# Patient Record
Sex: Male | Born: 1935 | Race: White | Hispanic: No | Marital: Married | Smoking: Former smoker
Health system: Southern US, Community
[De-identification: ages and names within clinical notes are randomized; demographics above are authoritative.]

## PROBLEM LIST (undated history)

## (undated) DIAGNOSIS — E119 Type 2 diabetes mellitus without complications: Secondary | ICD-10-CM

## (undated) DIAGNOSIS — H409 Unspecified glaucoma: Secondary | ICD-10-CM

## (undated) DIAGNOSIS — E785 Hyperlipidemia, unspecified: Secondary | ICD-10-CM

## (undated) DIAGNOSIS — I10 Essential (primary) hypertension: Secondary | ICD-10-CM

## (undated) DIAGNOSIS — N189 Chronic kidney disease, unspecified: Secondary | ICD-10-CM

## (undated) HISTORY — PX: CHOLECYSTECTOMY: SHX55

---

## 1999-04-09 ENCOUNTER — Emergency Department (HOSPITAL_COMMUNITY): Admission: EM | Admit: 1999-04-09 | Discharge: 1999-04-09 | Payer: Self-pay | Admitting: Emergency Medicine

## 2007-12-17 ENCOUNTER — Ambulatory Visit: Payer: Self-pay | Admitting: Internal Medicine

## 2007-12-17 ENCOUNTER — Inpatient Hospital Stay (HOSPITAL_COMMUNITY): Admission: EM | Admit: 2007-12-17 | Discharge: 2007-12-25 | Payer: Self-pay | Admitting: Emergency Medicine

## 2007-12-18 ENCOUNTER — Encounter: Payer: Self-pay | Admitting: Gastroenterology

## 2007-12-20 ENCOUNTER — Encounter (INDEPENDENT_AMBULATORY_CARE_PROVIDER_SITE_OTHER): Payer: Self-pay | Admitting: General Surgery

## 2007-12-21 ENCOUNTER — Encounter: Payer: Self-pay | Admitting: Gastroenterology

## 2010-05-26 NOTE — Miscellaneous (Signed)
Summary: ERCP  Clinical Lists Changes  Observations: Added new observation of ERCP: Location: [facility].  (12/21/2007 15:50)      ERCP  Procedure date:  12/21/2007  Findings:      Location: [facility].  Patient Name: Douglas Melton, Douglas Melton MRN: [id number] Procedure Procedures: Therapeutic Endoscopic Retrograde Cholangiopancreatography CPT: 43260.    with SphincterotomyCPT: 43262.    With Stone Removal CPT: S6289224.  Personnel: Endoscopist: Venita Lick. Russella Dar, MD, Clementeen Graham.  Exam Location: Exam performed in Endoscopy Suite. Inpatient-ward  Patient Consent: Procedure, Alternatives, Risks and Benefits discussed, consent obtained, from patient. Consent was obtained by the RN.  Indications  Abnormal Exams, Studies: Cholangiogram, abnormal, do not suspect malignancy. Chemistry, abnormal, do not suspect malignancy.  Symptoms: Jaundice.  Comments: CBD stone on IOC History  Current Medications: Patient is not currently taking Coumadin.  Pre-Exam Physical: Performed Dec 18, 2007. Cardio-pulmonary exam, Skin color, HEENT exam  WNL. Abdominal exam abnormal. Mental status exam WNL.  Comments: Pt. history reviewed/updated, physical exam performed prior to initiation of sedation?Yes Exam Exam: Images taken. ASA Classification: II. Tolerance: excellent.  Monitoring: Pulse and BP monitoring done. Oximetry used. Supplemental O2 given. at 2 Liters.  Sedation Meds: Patient assessed and found to be appropriate for moderate (conscious) sedation. Fentanyl 90.0 given IV. Versed 5 mg. given IV. Cetacaine Spray 2 sprays given aerosolized. Glucagon 0.5 mg. given IV.  Fluoroscopy: Fluoroscopy was used.  Findings Normal : Right Hepatic Duct.  Normal : Left Hepatic Duct.  Normal : Common Hepatic Duct.  - PRIOR SURGERY: Cholecystectomy.  STONE(S) : Maximum size: 5 mm. Common Bile Duct,  mobile, ICD9: Choledocholithiasis: 574.50.  - Stone Removal: Left Hepatic Duct. Patient  tolerance excellent. Outcome: successful. There were no complications. Comments: Balloon stone extraction of 2 CBD stones; excellent drainage following extraction.  Normal : Major Duodenal Papilla. Comments: prior sphincterotomy noted.  - Sphincterotomy: Left Hepatic Duct. Patient tolerance excellent. Outcome: successful. There were no complications. Comments: sphincterotomy extended.   Assessment  Diagnoses: 574.50: Choledocholithiasis.   Events  Unplanned Intervention: No intervention was required.  Unplanned Events: There were no complications. Plans  Post Exam Instructions: Post sedation instructions given. No aspirin or non-steroidal containing medications: 2 weeks.  Patient Education: Patient given standard instructions for: Stones.  Disposition: After procedure patient sent to recovery. After recovery patient sent back to hospital.  Scheduling: Office Visit, to [employer]. Russella Dar, MD, [facility], prn    This report was created from the original endoscopy report, which was reviewed and signed by the above listed endoscopist.

## 2010-05-26 NOTE — Procedures (Signed)
Summary: ERCP   ERCP  Procedure date:  12/18/2007  Findings:      Location: [facility].   Patient Name: Douglas Melton, Douglas Melton MRN: [id number] Procedure Procedures: Therapeutic Endoscopic Retrograde Cholangiopancreatography CPT: 43260.    with SphincterotomyCPT: 43262.    With Stone Removal CPT: S6289224.  Personnel: Endoscopist: Venita Lick. Russella Dar, MD, Clementeen Graham.  Exam Location: Exam performed in Endoscopy Suite. Inpatient-ward  Patient Consent: from patient. Consent was obtained by the RN.  Indications  Abnormal Exams, Studies: Chemistry, abnormal, do not suspect malignancy.  Symptoms: Jaundice.  History  Current Medications: Patient is not currently taking Coumadin.  Pre-Exam Physical: Performed Dec 18, 2007. Cardio-pulmonary exam, Skin color, HEENT exam , Abdominal exam, Mental status exam WNL.  Comments: Pt. history reviewed/updated, physical exam performed prior to initiation of sedation?Yes Exam Exam: Images taken. ASA Classification: II. Tolerance: excellent.  Monitoring: Pulse and BP monitoring done. Oximetry used. Supplemental O2 given. at 2 Liters.  Sedation Meds: Patient assessed and found to be appropriate for moderate (conscious) sedation. Fentanyl 100 mcg. given IV. Versed 10 mg. given IV. Cetacaine Spray 2 sprays given aerosolized. Glucagon 0.25 mg. given IV.  Fluoroscopy: Fluoroscopy was used.  Findings Normal : Right Hepatic Duct.  Normal : Left Hepatic Duct.  Normal : Common Hepatic Duct.  STONE(S) : Maximum size: 4 mm. Common Bile Duct,  mobile, ICD9: Choledocholithiasis: 574.50.  - Stone Removal: Major Duodenal Papilla. Patient tolerance excellent. Outcome: successful. There were no complications. Comments: balloon pull through X 2 without a stone noted but the biliary tree was clear.  Normal : Major Duodenal Papilla.  - Sphincterotomy: Major Duodenal Papilla. for stone extraction. Patient tolerance excellent. Outcome: successful. There  were no complications.   Events  Unplanned Intervention: No intervention was required.  Unplanned Events: There were no complications. Plans  Post Exam Instructions: Post sedation instructions given.  Patient Education: Patient given standard instructions for: Stones.  Disposition: After procedure patient sent to recovery. After recovery patient sent back to hospital.  Scheduling: Referring provider, to Harriette Bouillon, MD, Dec 18, 2007.    cc: Harriette Bouillon, MD  This report was created from the original endoscopy report, which was reviewed and signed by the above listed endoscopist.

## 2010-09-08 NOTE — H&P (Signed)
NAME:  Douglas Melton, Douglas Melton NO.:  [id number]   MEDICAL RECORD NO.:  [id number]          PATIENT TYPE:  EMS   LOCATION:  MAJO                         FACILITY:  [facility]   PHYSICIAN:  Cherylynn Ridges, M.D.    DATE OF BIRTH:  1935/09/24   DATE OF ADMISSION:  12/16/2007  DATE OF DISCHARGE:                              HISTORY & PHYSICAL   CHIEF COMPLAINT:  The patient is a 75 year old with likely acute  cholecystitis and cholelithiasis.   HISTORY OF PRESENT ILLNESS:  The patient became ill this morning shortly  after eating at a buffet at about 11 o'clock with epigastric right upper  quadrant pain.  This persisted throughout the day, got to the point  where he had enough pain, and he came in at quater to 8, this evening,  with right upper quadrant and epigastric pain.  He scored as 10/10 with  10 being the worse.  He had some nausea but no vomiting.  He was febrile  at about 100.6.  Ultrasound demonstrated gallstones with colposcopy and  dilated common bile duct.  LFTs are abnormal.  Surgical consultation  obtained.   PAST MEDICAL HISTORY:  Significant for:  1. History of glaucoma.  2. He has had some GERD in the past, not currently followed by routine      gastroenterologist.   PAST SURGICAL HISTORY:  Has had surgery of his right eye by Dr. Sheffield Slider.   He has no known drug allergies.   Only medication is eye drop in his left eye, Cosopt 1 drop b.i.d.   REVIEW OF SYSTEMS:  He has had no darkened stools or light stools.  No  dark urine.  He has had no syncope.  No chest pain and diarrhea or  constipation.   PHYSICAL EXAMINATION:  VITAL SIGNS:  Temperature of 100.8, his pulse is  113, blood pressure is 161/77.  HEENT:  He is normocephalic and atraumatic and anicteric.  He does look  at least in a fair amount of distress although he says his pain now is  1/10.  LUNGS:  Clear to auscultation.  CARDIAC:  Regular rhythm and rate with no murmurs.  ABDOMEN:  He is tender in  the right upper quadrant mildly and in the  epigastrium mildly, but not severe as he had previously.  I believe he  must have passed the stone.  RECTAL:  Not performed.  NEURO:  Cranial nerves II-XII grossly intact.   I have reviewed his laboratory studies, his alk phos, SGOT, SGPT, and  elevated total bilirubin at 4.7.  White count is 8.1 but he does have a  strong left shift with hemoglobin of 13.6.  I have not reviewed the  ultrasound, but the patient will get preoperative studies done.   IMPRESSION:  Acute cholecystitis and cholelithiasis, possible common  duct stone with cholangitis.  I have spoken with Dr. Lina Sar, With  [facility].  We will see the patient in the morning.  We will review  his studies and to see if any ERCP is necessary.  If this is done it  will  delay of surgery, if not then he may have surgery tomorrow, Monday,  for acute laparoscopic cholecystectomy.  I have explained this to the  patient and his wife.      Cherylynn Ridges, M.D.  Electronically Signed     JOW/MEDQ  D:  12/17/2007  T:  12/17/2007  Job:  [id number]   cc:   Dr. Andrey Campanile

## 2010-09-08 NOTE — Discharge Summary (Signed)
Douglas Melton, MCNEEL NO.:  [id number]   MEDICAL RECORD NO.:  [id number]          PATIENT TYPE:  INP   LOCATION:  5154                         FACILITY:  [facility]   PHYSICIAN:  Adolph Pollack, M.D.DATE OF BIRTH:  1935/10/22   DATE OF ADMISSION:  12/16/2007  DATE OF DISCHARGE:  12/25/2007                               DISCHARGE SUMMARY   OPERATIVE PHYSICIAN:  Troy Sine. Dwain Sarna, MD   CONSULTING PHYSICIAN:  Venita Lick. Russella Dar, MD, [facility], Gastroenterology.   CHIEF COMPLAINT/REASON FOR ADMISSION:  [name] is a 75 year old male  patient who developed postprandial abdominal pain on the date of  admission focalized to the right upper quadrant.  Pain persisted through  the day.  This had onset around lunch time and by evening he was having  severe right upper quadrant epigastric pain about 10/10 and nausea but  without vomiting.  He presented to the ER where he was found to have a  low-grade temperature of 100.6.  Ultrasound demonstrated gallstones with  a dilated common bile duct and signs and symptoms consistent with acute  cholecystitis.  He also had a elevated total bilirubin of 4.7 and white  count was 8100 with a left shift.  The patient was admitted with the  following diagnoses.  1. Acute cholecystitis and cholelithiasis.  2. Probable choledocholithiasis with elevated total bilirubin.  3. Possible ascending cholangitis.  4. Hyperglycemia.  Serum glucose 321 on initial BMET panel in the ER.   HOSPITAL COURSE:  The patient was admitted to the General Surgical Floor  where we started on empiric antibiotic therapy with Zosyn IV.  Because  of his hyperglycemia, he was started on glucometer checks q.4 h. with  sliding scale insulin and low-dose Lantus insulin an hour sleep.   Because of the choledocholithiasis and elevated total bilirubin,  Gastroenterology service was consulted to approach at the initial  consultation.  This was over the weekend and Monday,  Dr. Russella Dar assumed  care of the patient.  Because of the issues with choledocholithiasis, it  was felt the patient needed a preoperative ERCP.  This was done on  December 18, 2007.  The patient had stone retrieval and sphincterotomy and  was sent back to the floor with plans to proceed with laparoscopic  cholecystectomy in the next 24 hours.  In the interim, GI also worked up  the patient's anemia.  He had a macrocytic anemia and he was started on  oral B12 replacement therapy.   On December 20, 2007, the patient underwent a laparoscopic  cholecystectomy.  He was found to have an abnormal intraoperative  cholangiogram.  Please note that the patient's surgical procedure was  delayed an additional 24 hours due to multiple emergency cases on the  previously planned day of operative intervention.  Therefore, the  patient was unable to have the surgery completed until December 20, 2007.   Because of abnormal intraoperative cholangiogram, GI was called and  bilirubin was greater than 5.  There was concern he may have retained  stones, so he underwent an additional ERCP procedure.  On December 21, 2007, the  patient had 2 additional stones removed with excellent biliary  drainage.   At this point, the patient continued to have some difficulty with  postoperative nausea.  There was some concern he may have post ERCP  pancreatitis, but his amylase and lipase were normal.  His integumentary  jaundice was already resolving and about postoperative day #3, his total  bilirubin had decreased to 3.6, his nausea had resolved, and his diet  was advanced.   By postoperative day #5, the patient was tolerating a solid diet in  small amounts.  He was complaining of hypoglycemia with sugars going  down to 86.  It was determined at this point that the patient would not  need any additional insulin or other diabetic therapy treatment.  There  was some concern that maybe the patient's hyperglycemia was combination   of stress, infection, and transient pancreatitis or pancreatic  inflammation due to the choledocholithiasis and cholangitis issues.  He  has been instructed to followup with his primary care physician after  discharge to determine if additional diabetic workup is indicated.  He  was having minimal pain which was being controlled with Tylenol and  plans were to discharge the patient home on postoperative day #5.   FINAL DISCHARGE DIAGNOSES:  1. Abdominal pain secondary to acute cholecystitis.  2. Choledocholithiasis with preoperative and postoperative endoscopic      retrograde cholangiopancreatography for retained biliary tract      stones.  3. Ascending cholangitis, resolved.  4. Macrocytic anemia, placed on oral B12 therapy this admission per      Gastroenterology services.   DISCHARGE MEDICATIONS:  1. The patient will resume his prior Cosopt eye drops he was taking      for his glaucoma pre-admission.  2. Vitamin B12 2000 mcg daily.  Last hemoglobin was 11.9 on December 23, 2007.  3. Vicodin 5/325 one to two tablets every 4 hours as needed for pain.      Use this medication if plain Tylenol or ibuprofen is not      controlling your pain.   DIET:  No restrictions.   WOUND CARE:  Band-Aid over your prior drain site on the right abdomen  removed for draining.   ACTIVITY:  Increase activity slowly.  May walk up steps.  May shower.  No lifting for the next 2 weeks greater than 15 pounds.  No driving for  1 week.   FOLLOWUP APPOINTMENTS:  1. You are to see the physician extender at the [facility] on January 02, 2008 at 2:15, arrive at 2:00 p.m.,      their telephone number is [phone].  2. You are to notify Dr. Andrey Campanile with [facility]      regarding followup regarding the macrocytosis with mild anemia and      hyperglycemia.  Call to be seen in the next 2-4 weeks.   ADDITIONAL INSTRUCTIONS:  1. You are to call our office if you  start running fever greater than      101.5 degrees Fahrenheit, new or worsening      abdominal pain, if you developed any redness or purulence from your      incisions.  2. In addition, you have received Pneumococcal vaccine on December 19, 2007 and you have been given an immunization card prior to      discharge.      Allison L. Rennis Harding, N.P.  Adolph Pollack, M.D.  Electronically Signed    ALE/MEDQ  D:  12/25/2007  T:  12/25/2007  Job:  [id number]   cc:   Juanetta Gosling, MD  Gloriajean Dell. Andrey Campanile, M.D.

## 2010-09-08 NOTE — Op Note (Signed)
NAMEEBRIMA, RANTA NO.:  [id number]   MEDICAL RECORD NO.:  [id number]          PATIENT TYPE:  INP   LOCATION:  5154                         FACILITY:  [facility]   PHYSICIAN:  Juanetta Gosling, MDDATE OF BIRTH:  August 04, 1935   DATE OF PROCEDURE:  12/20/2007  DATE OF DISCHARGE:                               OPERATIVE REPORT   PREOPERATIVE DIAGNOSES:  1. Choledocholithiasis.  2. Cholecystitis.   POSTOPERATIVE DIAGNOSES:  1. Choledocholithiasis.  2. Cholecystitis.  3. Retained common bile duct stone.   PROCEDURE:  Laparoscopic cholecystectomy with intraoperative  cholangiogram.   SURGEON:  Troy Sine. Dwain Sarna, MD   ASSISTANT:  Currie Paris, MD   General endotracheal anesthesia.   ESTIMATED BLOOD LOSS:  Minimal.   DRAINS:  A 19-French Blake drain to his gallbladder fossa.   SPECIMENS:  Gallbladder and contents.   INDICATIONS:  This is a 75 year old male who arrived with a right upper  quadrant pain and LFTs in an exam consistent with both  choledocholithiasis and cholecystitis.  He underwent an ERCP on Monday,  which appeared to clear his duct, but his bilirubin has risen today.  On  discussion with Gastroenterology, I will plan on a lap chole today with  a cholangiogram if he has any retained stones and they will proceed with  an ERCP tomorrow.   PROCEDURE:  After informed consent was obtained, the patient was taken  to the operating room.  He received intravenous antibiotics on the floor  where he received Zosyn just prior to this procedure.  He was placed  under general endotracheal anesthesia without complications.  Sequential  compression devices were placed on his lower extremity throughout this  procedure.  His abdomen was then prepped and draped in a standard  sterile surgical fashion.  A 10-mm vertical incision was then made just  below his umbilicus and dissection was carried out on the fascia, which  was entered sharply.  His  peritoneum was then entered bluntly.  Abdomen  was then insufflated to 50 mmHg pressure without complication.  Three  further 5-mm ports were placed in the epigastrium and right upper  quadrant under direct vision after infiltration with local anesthetic.  There was noted to be some bile staining below his liver prior to  beginning the operation.  The gallbladder was retracted cephalad and  lateral.  There was noted to be some inflammation in the triangle of  Calot.  The ERCP films were present in the operating room for our  review.  His cystic duct was dissected free from his surrounding  structures.  A clip was placed distally and then the area was cut.  I  then introduced a Cook catheter into the cystic duct without much  difficulty.  A cholangiogram was then performed, which showed what  appeared to be 2 retained stones in his common bile duct as well as no  filling of his duodenum despite his sphincterotomy and we were in the  cystic duct with good filling of the liver.  The [facility] catheter was then  removed.  I then clipped the duct twice proximally and  then cut the  cystic duct.  Due to the fact that, I think he will begin an ERCP.  I  placed an Endoloop over the cystic duct stump following that as well.  The cystic artery was identified and clipped appropriately and then  divided.  The gallbladder was then removed from the gallbladder fossa.  With some difficulty, there was some bleeding that was controlled with  cautery as well as a small hole in the gallbladder with a small amount  of bile that leaked.  Hemostasis observed.  I did place Surgicel in the  bed of the gallbladder.  Upon completion, copious irrigation was  performed.  The 5-mm camera was introduced in the epigastrium and an  EndoCatch was introduced into the umbilicus.  The gallbladder was placed  in this and removed without difficulty.  Then due to the fact he also is  getting ERCP, I decided to place a 19-French Blake  drain.  This was then  introduced into the umbilicus and brought out through one of the right  upper quadrant ports and laid underneath the liver.  The pursestring  Vicryl was used to close this fascia with good closure.  All wounds were  then closed with 4-0 Monocryl and Dermabond applied.  He tolerated this  well and was extubated in the operating room and transferred to the  recovery room in stable condition.      Juanetta Gosling, MD  Electronically Signed     MCW/MEDQ  D:  12/20/2007  T:  12/21/2007  Job:  [id number]

## 2010-12-07 ENCOUNTER — Other Ambulatory Visit: Payer: Self-pay | Admitting: Dermatology

## 2012-07-19 ENCOUNTER — Encounter (INDEPENDENT_AMBULATORY_CARE_PROVIDER_SITE_OTHER): Payer: Medicare Other | Admitting: Ophthalmology

## 2012-07-19 DIAGNOSIS — H4010X0 Unspecified open-angle glaucoma, stage unspecified: Secondary | ICD-10-CM

## 2012-07-19 DIAGNOSIS — H43819 Vitreous degeneration, unspecified eye: Secondary | ICD-10-CM

## 2012-07-19 DIAGNOSIS — H4420 Degenerative myopia, unspecified eye: Secondary | ICD-10-CM

## 2012-07-19 DIAGNOSIS — I10 Essential (primary) hypertension: Secondary | ICD-10-CM

## 2012-07-19 DIAGNOSIS — H35039 Hypertensive retinopathy, unspecified eye: Secondary | ICD-10-CM

## 2015-07-14 ENCOUNTER — Encounter (INDEPENDENT_AMBULATORY_CARE_PROVIDER_SITE_OTHER): Payer: Medicare Other | Admitting: Ophthalmology

## 2015-07-14 DIAGNOSIS — H4423 Degenerative myopia, bilateral: Secondary | ICD-10-CM | POA: Diagnosis not present

## 2015-07-14 DIAGNOSIS — H35033 Hypertensive retinopathy, bilateral: Secondary | ICD-10-CM | POA: Diagnosis not present

## 2015-07-14 DIAGNOSIS — I10 Essential (primary) hypertension: Secondary | ICD-10-CM | POA: Diagnosis not present

## 2015-07-22 DIAGNOSIS — E1121 Type 2 diabetes mellitus with diabetic nephropathy: Secondary | ICD-10-CM | POA: Insufficient documentation

## 2015-07-22 DIAGNOSIS — R6889 Other general symptoms and signs: Secondary | ICD-10-CM | POA: Insufficient documentation

## 2015-07-22 DIAGNOSIS — L409 Psoriasis, unspecified: Secondary | ICD-10-CM | POA: Insufficient documentation

## 2015-07-22 DIAGNOSIS — L219 Seborrheic dermatitis, unspecified: Secondary | ICD-10-CM | POA: Insufficient documentation

## 2015-07-22 DIAGNOSIS — E7800 Pure hypercholesterolemia, unspecified: Secondary | ICD-10-CM | POA: Insufficient documentation

## 2015-07-23 DIAGNOSIS — K5901 Slow transit constipation: Secondary | ICD-10-CM | POA: Insufficient documentation

## 2016-08-23 DIAGNOSIS — H543 Unqualified visual loss, both eyes: Secondary | ICD-10-CM | POA: Insufficient documentation

## 2017-09-07 DIAGNOSIS — Z0000 Encounter for general adult medical examination without abnormal findings: Secondary | ICD-10-CM | POA: Insufficient documentation

## 2018-11-01 ENCOUNTER — Encounter (HOSPITAL_COMMUNITY): Payer: Self-pay | Admitting: Emergency Medicine

## 2018-11-01 ENCOUNTER — Inpatient Hospital Stay (HOSPITAL_COMMUNITY)
Admission: EM | Admit: 2018-11-01 | Discharge: 2018-11-04 | DRG: 378 | Disposition: A | Discharge status: Home / Self Care | Payer: Medicare Other | Attending: Internal Medicine | Admitting: Internal Medicine

## 2018-11-01 ENCOUNTER — Other Ambulatory Visit: Payer: Self-pay

## 2018-11-01 ENCOUNTER — Emergency Department (HOSPITAL_COMMUNITY): Payer: Medicare Other

## 2018-11-01 DIAGNOSIS — E538 Deficiency of other specified B group vitamins: Secondary | ICD-10-CM | POA: Diagnosis not present

## 2018-11-01 DIAGNOSIS — I9589 Other hypotension: Secondary | ICD-10-CM | POA: Diagnosis not present

## 2018-11-01 DIAGNOSIS — Z9049 Acquired absence of other specified parts of digestive tract: Secondary | ICD-10-CM | POA: Diagnosis not present

## 2018-11-01 DIAGNOSIS — D631 Anemia in chronic kidney disease: Secondary | ICD-10-CM | POA: Diagnosis present

## 2018-11-01 DIAGNOSIS — E119 Type 2 diabetes mellitus without complications: Secondary | ICD-10-CM

## 2018-11-01 DIAGNOSIS — E1122 Type 2 diabetes mellitus with diabetic chronic kidney disease: Secondary | ICD-10-CM | POA: Diagnosis present

## 2018-11-01 DIAGNOSIS — D61818 Other pancytopenia: Secondary | ICD-10-CM

## 2018-11-01 DIAGNOSIS — E1159 Type 2 diabetes mellitus with other circulatory complications: Secondary | ICD-10-CM

## 2018-11-01 DIAGNOSIS — D649 Anemia, unspecified: Secondary | ICD-10-CM | POA: Diagnosis not present

## 2018-11-01 DIAGNOSIS — Z1159 Encounter for screening for other viral diseases: Secondary | ICD-10-CM

## 2018-11-01 DIAGNOSIS — R17 Unspecified jaundice: Secondary | ICD-10-CM | POA: Diagnosis present

## 2018-11-01 DIAGNOSIS — Z888 Allergy status to other drugs, medicaments and biological substances status: Secondary | ICD-10-CM | POA: Diagnosis not present

## 2018-11-01 DIAGNOSIS — H548 Legal blindness, as defined in USA: Secondary | ICD-10-CM | POA: Diagnosis present

## 2018-11-01 DIAGNOSIS — N183 Chronic kidney disease, stage 3 unspecified: Secondary | ICD-10-CM

## 2018-11-01 DIAGNOSIS — E861 Hypovolemia: Secondary | ICD-10-CM | POA: Diagnosis present

## 2018-11-01 DIAGNOSIS — Z91040 Latex allergy status: Secondary | ICD-10-CM | POA: Diagnosis not present

## 2018-11-01 DIAGNOSIS — E039 Hypothyroidism, unspecified: Secondary | ICD-10-CM | POA: Diagnosis present

## 2018-11-01 DIAGNOSIS — H409 Unspecified glaucoma: Secondary | ICD-10-CM | POA: Diagnosis present

## 2018-11-01 DIAGNOSIS — N179 Acute kidney failure, unspecified: Secondary | ICD-10-CM | POA: Diagnosis present

## 2018-11-01 DIAGNOSIS — I129 Hypertensive chronic kidney disease with stage 1 through stage 4 chronic kidney disease, or unspecified chronic kidney disease: Secondary | ICD-10-CM | POA: Diagnosis present

## 2018-11-01 DIAGNOSIS — D539 Nutritional anemia, unspecified: Secondary | ICD-10-CM | POA: Diagnosis present

## 2018-11-01 DIAGNOSIS — E785 Hyperlipidemia, unspecified: Secondary | ICD-10-CM | POA: Diagnosis present

## 2018-11-01 DIAGNOSIS — K920 Hematemesis: Secondary | ICD-10-CM | POA: Diagnosis present

## 2018-11-01 DIAGNOSIS — Z8249 Family history of ischemic heart disease and other diseases of the circulatory system: Secondary | ICD-10-CM

## 2018-11-01 DIAGNOSIS — Z91048 Other nonmedicinal substance allergy status: Secondary | ICD-10-CM

## 2018-11-01 DIAGNOSIS — D513 Other dietary vitamin B12 deficiency anemia: Secondary | ICD-10-CM | POA: Diagnosis present

## 2018-11-01 DIAGNOSIS — I152 Hypertension secondary to endocrine disorders: Secondary | ICD-10-CM

## 2018-11-01 DIAGNOSIS — N189 Chronic kidney disease, unspecified: Secondary | ICD-10-CM | POA: Diagnosis not present

## 2018-11-01 DIAGNOSIS — Z87891 Personal history of nicotine dependence: Secondary | ICD-10-CM

## 2018-11-01 DIAGNOSIS — I10 Essential (primary) hypertension: Secondary | ICD-10-CM | POA: Diagnosis not present

## 2018-11-01 DIAGNOSIS — D519 Vitamin B12 deficiency anemia, unspecified: Secondary | ICD-10-CM | POA: Diagnosis not present

## 2018-11-01 HISTORY — DX: Unspecified glaucoma: H40.9

## 2018-11-01 HISTORY — DX: Hyperlipidemia, unspecified: E78.5

## 2018-11-01 HISTORY — DX: Chronic kidney disease, unspecified: N18.9

## 2018-11-01 HISTORY — DX: Essential (primary) hypertension: I10

## 2018-11-01 HISTORY — DX: Type 2 diabetes mellitus without complications: E11.9

## 2018-11-01 LAB — CBC WITH DIFFERENTIAL/PLATELET
Abs Immature Granulocytes: 0.01 10*3/uL (ref 0.00–0.07)
Basophils Absolute: 0 10*3/uL (ref 0.0–0.1)
Basophils Relative: 0 %
Eosinophils Absolute: 0 10*3/uL (ref 0.0–0.5)
Eosinophils Relative: 1 %
HCT: 15.4 % — ABNORMAL LOW (ref 39.0–52.0)
Hemoglobin: 5.3 g/dL — CL (ref 13.0–17.0)
Immature Granulocytes: 0 %
Lymphocytes Relative: 65 %
Lymphs Abs: 1.8 10*3/uL (ref 0.7–4.0)
MCH: 46.1 pg — ABNORMAL HIGH (ref 26.0–34.0)
MCHC: 34.4 g/dL (ref 30.0–36.0)
MCV: 133.9 fL — ABNORMAL HIGH (ref 80.0–100.0)
Monocytes Absolute: 0.2 10*3/uL (ref 0.1–1.0)
Monocytes Relative: 5 %
Neutro Abs: 0.8 10*3/uL — ABNORMAL LOW (ref 1.7–7.7)
Neutrophils Relative %: 29 %
Platelets: 83 10*3/uL — ABNORMAL LOW (ref 150–400)
RBC: 1.15 MIL/uL — ABNORMAL LOW (ref 4.22–5.81)
RDW: 15.1 % (ref 11.5–15.5)
WBC: 2.8 10*3/uL — ABNORMAL LOW (ref 4.0–10.5)
nRBC: 0 % (ref 0.0–0.2)

## 2018-11-01 LAB — BASIC METABOLIC PANEL
Anion gap: 11 (ref 5–15)
BUN: 28 mg/dL — ABNORMAL HIGH (ref 8–23)
CO2: 20 mmol/L — ABNORMAL LOW (ref 22–32)
Calcium: 8.6 mg/dL — ABNORMAL LOW (ref 8.9–10.3)
Chloride: 108 mmol/L (ref 98–111)
Creatinine, Ser: 1.68 mg/dL — ABNORMAL HIGH (ref 0.61–1.24)
GFR calc Af Amer: 43 mL/min — ABNORMAL LOW (ref >60)
GFR calc non Af Amer: 38 mL/min — ABNORMAL LOW (ref >60)
Glucose, Bld: 145 mg/dL — ABNORMAL HIGH (ref 70–99)
Potassium: 4.6 mmol/L (ref 3.5–5.1)
Sodium: 139 mmol/L (ref 135–145)

## 2018-11-01 LAB — HEPATIC FUNCTION PANEL
ALT: 12 U/L (ref 0–44)
AST: 23 U/L (ref 15–41)
Albumin: 3.4 g/dL — ABNORMAL LOW (ref 3.5–5.0)
Alkaline Phosphatase: 51 U/L (ref 38–126)
Bilirubin, Direct: 1.2 mg/dL — ABNORMAL HIGH (ref 0.0–0.2)
Indirect Bilirubin: 2.2 mg/dL — ABNORMAL HIGH (ref 0.3–0.9)
Total Bilirubin: 3.4 mg/dL — ABNORMAL HIGH (ref 0.3–1.2)
Total Protein: 5.5 g/dL — ABNORMAL LOW (ref 6.5–8.1)

## 2018-11-01 LAB — SARS CORONAVIRUS 2 BY RT PCR (HOSPITAL ORDER, PERFORMED IN ~~LOC~~ HOSPITAL LAB): SARS Coronavirus 2: NEGATIVE

## 2018-11-01 LAB — ABO/RH: ABO/RH(D): O POS

## 2018-11-01 LAB — PREPARE RBC (CROSSMATCH)

## 2018-11-01 MED ORDER — ONDANSETRON HCL 4 MG/2ML IJ SOLN: 4.0000 mg | Freq: Four times a day (QID) | INTRAMUSCULAR | Status: DC | PRN

## 2018-11-01 MED ORDER — PANTOPRAZOLE SODIUM 40 MG IV SOLR
40.0000 mg | Freq: Once | INTRAVENOUS | Status: AC
  Administered 2018-11-01: 40 mg via INTRAVENOUS
  Filled 2018-11-01: qty 40

## 2018-11-01 MED ORDER — SODIUM CHLORIDE 0.9% FLUSH
3.0000 mL | Freq: Two times a day (BID) | INTRAVENOUS | Status: DC
  Administered 2018-11-01 – 2018-11-03 (×4): 3 mL via INTRAVENOUS

## 2018-11-01 MED ORDER — BOOST / RESOURCE BREEZE PO LIQD CUSTOM
1.0000 | Freq: Three times a day (TID) | ORAL | Status: DC
  Administered 2018-11-02: 1 via ORAL

## 2018-11-01 MED ORDER — PANTOPRAZOLE SODIUM 40 MG IV SOLR: 40.0000 mg | Freq: Two times a day (BID) | INTRAVENOUS | Status: DC

## 2018-11-01 MED ORDER — OCTREOTIDE LOAD VIA INFUSION
50.0000 ug | Freq: Once | INTRAVENOUS | Status: AC
  Administered 2018-11-01: 50 ug via INTRAVENOUS
  Filled 2018-11-01: qty 25

## 2018-11-01 MED ORDER — ONDANSETRON HCL 4 MG/2ML IJ SOLN: 4.0000 mg | Freq: Once | INTRAMUSCULAR | Status: DC

## 2018-11-01 MED ORDER — DORZOLAMIDE HCL-TIMOLOL MAL 2-0.5 % OP SOLN
1.0000 [drp] | Freq: Two times a day (BID) | OPHTHALMIC | Status: DC
  Administered 2018-11-01 – 2018-11-04 (×6): 1 [drp] via OPHTHALMIC
  Filled 2018-11-01: qty 10

## 2018-11-01 MED ORDER — ONDANSETRON HCL 4 MG PO TABS: 4.0000 mg | ORAL_TABLET | Freq: Four times a day (QID) | ORAL | Status: DC | PRN

## 2018-11-01 MED ORDER — SODIUM CHLORIDE 0.9% IV SOLUTION: Freq: Once | INTRAVENOUS | Status: DC

## 2018-11-01 MED ORDER — SODIUM CHLORIDE 0.9 % IV SOLN
50.0000 ug/h | INTRAVENOUS | Status: DC
  Administered 2018-11-01: 50 ug/h via INTRAVENOUS
  Filled 2018-11-01: qty 1

## 2018-11-01 MED ORDER — SODIUM CHLORIDE 0.9 % IV BOLUS
1000.0000 mL | Freq: Once | INTRAVENOUS | Status: AC
  Administered 2018-11-01: 1000 mL via INTRAVENOUS

## 2018-11-01 MED ORDER — SODIUM CHLORIDE 0.9 % IV SOLN
INTRAVENOUS | Status: AC
  Administered 2018-11-02: 02:00:00 via INTRAVENOUS

## 2018-11-01 MED ORDER — LEVOTHYROXINE SODIUM 100 MCG PO TABS
100.0000 ug | ORAL_TABLET | Freq: Every day | ORAL | Status: DC
  Administered 2018-11-03 – 2018-11-04 (×2): 100 ug via ORAL
  Filled 2018-11-01 (×2): qty 1

## 2018-11-01 MED ORDER — ACETAMINOPHEN 325 MG PO TABS: 650.0000 mg | ORAL_TABLET | Freq: Four times a day (QID) | ORAL | Status: DC | PRN

## 2018-11-01 MED ORDER — ACETAMINOPHEN 650 MG RE SUPP: 650.0000 mg | Freq: Four times a day (QID) | RECTAL | Status: DC | PRN

## 2018-11-01 NOTE — ED Notes (Signed)
Attempted to call patients wife and provide update, no answer.

## 2018-11-01 NOTE — ED Notes (Signed)
Pt received 22mcg bolus of osteotride and then became nauseous and refused rest of medication.

## 2018-11-01 NOTE — ED Notes (Signed)
Attempted report x1. 

## 2018-11-01 NOTE — ED Notes (Signed)
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

## 2018-11-01 NOTE — ED Provider Notes (Signed)
[facility] EMERGENCY DEPARTMENT Provider Note   CSN: [id number] Arrival date & time: 11/01/18  1705    History   Chief Complaint Chief Complaint  Patient presents with  . Emesis  . Hypotension    HPI Douglas Melton is a 83 y.o. male.     83 yo M with a chief complaints of feeling lightheaded when he stands up.  This been going on for about a week or so.  He went to see his doctor in the office today.  Patient thinks that secondary to coming off of his metformin and a statin.  He did that just prior to the onset of the novel coronavirus pandemic.  He denies fevers or chills denies nausea or vomiting denies abdominal pain.  He has been vomiting about every couple weeks.  Is not sure why.  Unsure if he seen a GI doctor in the past.  He has seen a GI doctor in the past and had a normal colonoscopy and endoscopy he thinks.  The history is provided by the patient.  Emesis Severity:  Moderate Duration:  2 weeks Timing:  Intermittent Number of daily episodes:  Every 2 weeks Quality:  Bright red blood Able to tolerate:  Liquids Progression:  Worsening Chronicity:  New Recent urination:  Normal Relieved by:  Nothing Worsened by:  Nothing Ineffective treatments:  None tried Associated symptoms: no abdominal pain, no arthralgias, no chills, no diarrhea, no fever, no headaches and no myalgias     Past Medical History:  Diagnosis Date  . Diabetes mellitus without complication ([place])     There are no active problems to display for this patient.   History reviewed. No pertinent surgical history.      Home Medications    Prior to Admission medications   Not on File    Family History No family history on file.  Social History Social History   Tobacco Use  . Smoking status: Never Smoker  . Smokeless tobacco: Never Used  Substance Use Topics  . Alcohol use: Never    Frequency: Never  . Drug use: Not on file     Allergies   Patient has no  allergy information on record.   Review of Systems Review of Systems  Constitutional: Negative for chills and fever.  HENT: Negative for congestion and facial swelling.   Eyes: Negative for discharge and visual disturbance.  Respiratory: Negative for shortness of breath.   Cardiovascular: Negative for chest pain and palpitations.  Gastrointestinal: Positive for nausea and vomiting. Negative for abdominal pain and diarrhea.  Musculoskeletal: Negative for arthralgias and myalgias.  Skin: Negative for color change and rash.  Neurological: Positive for light-headedness. Negative for tremors, syncope and headaches.  Psychiatric/Behavioral: Negative for confusion and dysphoric mood.     Physical Exam Updated Vital Signs BP (!) 115/56   Pulse 84   Temp 98.1 F (36.7 C)   SpO2 99%   Physical Exam Vitals signs and nursing note reviewed.  Constitutional:      Appearance: He is well-developed.     Comments: Marked pallor  HENT:     Head: Normocephalic and atraumatic.  Eyes:     Pupils: Pupils are equal, round, and reactive to light.  Neck:     Musculoskeletal: Normal range of motion and neck supple.     Vascular: No JVD.  Cardiovascular:     Rate and Rhythm: Normal rate and regular rhythm.     Heart sounds: No murmur. No friction rub.  No gallop.   Pulmonary:     Effort: No respiratory distress.     Breath sounds: No wheezing.  Abdominal:     General: There is no distension.     Tenderness: There is no guarding or rebound.  Musculoskeletal: Normal range of motion.  Skin:    Coloration: Skin is not pale.     Findings: No rash.  Neurological:     Mental Status: He is alert and oriented to person, place, and time.  Psychiatric:        Behavior: Behavior normal.      ED Treatments / Results  Labs (all labs ordered are listed, but only abnormal results are displayed) Labs Reviewed  CBC WITH DIFFERENTIAL/PLATELET - Abnormal; Notable for the following components:       Result Value   WBC 2.8 (*)    RBC 1.15 (*)    Hemoglobin 5.3 (*)    HCT 15.4 (*)    MCV 133.9 (*)    MCH 46.1 (*)    Platelets 83 (*)    Neutro Abs 0.8 (*)    All other components within normal limits  BASIC METABOLIC PANEL - Abnormal; Notable for the following components:   CO2 20 (*)    Glucose, Bld 145 (*)    BUN 28 (*)    Creatinine, Ser 1.68 (*)    Calcium 8.6 (*)    GFR calc non Af Amer 38 (*)    GFR calc Af Amer 43 (*)    All other components within normal limits  HEPATIC FUNCTION PANEL - Abnormal; Notable for the following components:   Total Protein 5.5 (*)    Albumin 3.4 (*)    Total Bilirubin 3.4 (*)    Bilirubin, Direct 1.2 (*)    Indirect Bilirubin 2.2 (*)    All other components within normal limits  SARS CORONAVIRUS 2 (HOSPITAL ORDER, [facility])  IRON AND TIBC  FERRITIN  RETICULOCYTES  HAPTOGLOBIN  VITAMIN B12  FOLATE  TYPE AND SCREEN  ABO/RH  PREPARE RBC (CROSSMATCH)    EKG None  Radiology No results found.  Procedures Procedures (including critical care time)  Medications Ordered in ED Medications  ondansetron (ZOFRAN) injection 4 mg (4 mg Intravenous Refused 11/01/18 1813)  0.9 %  sodium chloride infusion (Manually program via Guardrails IV Fluids) (has no administration in time range)  octreotide (SANDOSTATIN) 2 mcg/mL load via infusion 50 mcg (has no administration in time range)    And  octreotide (SANDOSTATIN) 500 mcg in sodium chloride 0.9 % 250 mL (2 mcg/mL) infusion (has no administration in time range)  sodium chloride 0.9 % bolus 1,000 mL (0 mLs Intravenous Stopped 11/01/18 1941)  pantoprazole (PROTONIX) injection 40 mg (40 mg Intravenous Given 11/01/18 1941)     Initial Impression / Assessment and Plan / ED Course  I have reviewed the triage vital signs and the nursing notes.  Pertinent labs & imaging results that were available during my care of the patient were reviewed by me and considered in my medical  decision making (see chart for details).        83 yo M with a chief complaints of lightheadedness upon standing.  This is been going on for the past week or so.  He went to see his doctor in the office today and was found to be hypotensive and a point-of-care hemoglobin showed it to be 5.9.  The patient is clinically blind and so is not sure  what his vomit looks like but per her his wife who he had a bright red blood mixed in with it.  Patient denies dark stools or blood in his stool though is not sure.  No abdominal pain.  Patient's blood pressure here is normal though after 500 cc IV fluids in route.  Will recheck lab work give another bolus of IV fluids and reassess.  Patient's hemoglobin was found to be 5.3.  MCV is significantly elevated at 133.  I discussed the case with Dr. Rush Landmark, gastroenterology he recommended starting the patient on octreotide as well as Protonix and making him n.p.o. at midnight for likely endoscopic in the morning.  Discussed with medicine who admit.  I ordered 2 units of blood to be transfused.  CRITICAL CARE Performed by: Cecilio Asper   Total critical care time: 35 minutes  Critical care time was exclusive of separately billable procedures and treating other patients.  Critical care was necessary to treat or prevent imminent or life-threatening deterioration.  Critical care was time spent personally by me on the following activities: development of treatment plan with patient and/or surrogate as well as nursing, discussions with consultants, evaluation of patient's response to treatment, examination of patient, obtaining history from patient or surrogate, ordering and performing treatments and interventions, ordering and review of laboratory studies, ordering and review of radiographic studies, pulse oximetry and re-evaluation of patient's condition.  The patients results and plan were reviewed and discussed.   Any x-rays performed were  independently reviewed by myself.   Differential diagnosis were considered with the presenting HPI.  Medications  ondansetron (ZOFRAN) injection 4 mg (4 mg Intravenous Refused 11/01/18 1813)  0.9 %  sodium chloride infusion (Manually program via Guardrails IV Fluids) (has no administration in time range)  octreotide (SANDOSTATIN) 2 mcg/mL load via infusion 50 mcg (has no administration in time range)    And  octreotide (SANDOSTATIN) 500 mcg in sodium chloride 0.9 % 250 mL (2 mcg/mL) infusion (has no administration in time range)  sodium chloride 0.9 % bolus 1,000 mL (0 mLs Intravenous Stopped 11/01/18 1941)  pantoprazole (PROTONIX) injection 40 mg (40 mg Intravenous Given 11/01/18 1941)    Vitals:   11/01/18 1930 11/01/18 1945 11/01/18 2000 11/01/18 2015  BP: (!) 114/53 119/62 110/62 (!) 115/56  Pulse: 69 70 82 84  Temp:    98.1 F (36.7 C)  SpO2: 99% 100% 99% 99%    Final diagnoses:  Hyperbilirubinemia    Admission/ observation were discussed with the admitting physician, patient and/or family and they are comfortable with the plan.     Final Clinical Impressions(s) / ED Diagnoses   Final diagnoses:  Hyperbilirubinemia    ED Discharge Orders    None       Deno Etienne, DO 11/01/18 2031

## 2018-11-01 NOTE — H&P (Signed)
History and Physical    Douglas Melton MRN:[id number] DOB: 11/03/1935 DOA: 11/01/2018  PCP: Wilson, Fred H, MD  Patient coming from: PCP office  I have personally briefly reviewed patient's old medical records in [facility]  Chief Complaint: Lightheaded/dizzy  HPI: Douglas Melton is a 83 y.o. male with medical history significant for type 2 diabetes, hypertension, hyperlipidemia, CKD stage III, hypothyroidism, and glaucoma who presents to the ED as advised by his PCP for evaluation of symptomatic anemia.  Patient reports about 3 weeks of intermittent lightheadedness and dizziness on standing and with activity.  He initially felt this was related to discontinuation of his metformin and statin.  He has been having intermittent nausea with vomiting.  He has been having poor appetite and generalized weakness.  His wife has noticed some blood in his emesis.  Patient reports poor vision due to glaucoma and has not noticed whether he has had black or bloody stools.  He denies any abdominal pain, fevers, chills, diaphoresis, chest pain, or dyspnea.  He was seen by his PCP today 11/01/2018 and was noted to have a hemoglobin of 5.2 and was subsequently sent to the ED for further evaluation.  ED Course:  Initial vitals showed BP 120/46, pulse 69, RR 14, temp 98.0 Fahrenheit, SPO2 100% on room air.  Labs were notable for hemoglobin 5.3, WBC 2.8, platelets 83,000, MCV 133.9, BUN 28, creatinine 1.68, potassium 4.6, AST 23, ALT 12, alk phos 51, total bilirubin 3.4, direct bilirubin 1.2, indirect bilirubin 2.2.  SARS-CoV-2 test was negative.  Right upper quadrant ultrasound showed changes of surgically removed gallbladder, changes suggestive of fatty infiltration of the liver without focal mass, and CBD diameter of 7.5.  The ED physician discussed the case with on-call GI who recommended starting octreotide and plan for possible EGD in a.m.  Patient was given IV Protonix 40 mg once, octreotide bolus, and  ordered for transfusion of 2 units PRBCs.  The hospitalist service was consulted to admit for further evaluation and management.  Review of Systems: All systems reviewed and are negative except as documented in history of present illness above.   Past Medical History:  Diagnosis Date  . Diabetes mellitus without complication (HCC)     Past Surgical History:  Procedure Laterality Date  . CHOLECYSTECTOMY      Social History:  reports that he has never smoked. He has never used smokeless tobacco. He reports that he does not drink alcohol. No history on file for drug.  Allergies  Allergen Reactions  . Adhesive [Tape] Rash  . Fluorescein-Benoxinate Rash    FA dye (Fluress- eyes)    . Latex Rash    Family History  Problem Relation Age of Onset  . Heart disease Father   . Hypertension Father      Prior to Admission medications   Not on File    Physical Exam: Vitals:   11/01/18 2045 11/01/18 2100 11/01/18 2211 11/01/18 2221  BP: 120/64 129/62 119/66   Pulse: 96 66 67   Resp:   14   Temp:   98.1 F (36.7 C)   TempSrc:   Oral   SpO2: 100% 100% 100%   Weight:    64.2 kg  Height:    6' 2" (1.88 m)    Constitutional: Elderly man resting supine in bed, NAD, calm, comfortable Eyes: PERRL, conjunctival pallor present ENMT: Mucous membranes are dry. Posterior pharynx clear of any exudate or lesions.Normal dentition.  Neck: normal, supple, no masses. Respiratory: clear   to auscultation bilaterally, no wheezing, no crackles. Normal respiratory effort. No accessory muscle use.  Cardiovascular: Regular rate and rhythm, no murmurs / rubs / gallops. No extremity edema. 2+ pedal pulses.  Cap refill less than 2 seconds. Abdomen: no tenderness, no masses palpated. No hepatosplenomegaly. Bowel sounds positive.  Musculoskeletal: no clubbing / cyanosis. No joint deformity upper and lower extremities. Good ROM, no contractures. Normal muscle tone.  Skin: Pale complexion, no rashes,  lesions, ulcers. No induration Neurologic: Sensation intact, Strength 5/5 in all 4.  Psychiatric:  Alert and oriented x 3. Normal mood.     Labs on Admission: I have personally reviewed following labs and imaging studies  CBC: Recent Labs  Lab 11/01/18 1830  WBC 2.8*  NEUTROABS 0.8*  HGB 5.3*  HCT 15.4*  MCV 133.9*  PLT 83*   Basic Metabolic Panel: Recent Labs  Lab 11/01/18 1830  NA 139  K 4.6  CL 108  CO2 20*  GLUCOSE 145*  BUN 28*  CREATININE 1.68*  CALCIUM 8.6*   GFR: Estimated Creatinine Clearance: 31.3 mL/min (A) (by C-G formula based on SCr of 1.68 mg/dL (H)). Liver Function Tests: Recent Labs  Lab 11/01/18 1830  AST 23  ALT 12  ALKPHOS 51  BILITOT 3.4*  PROT 5.5*  ALBUMIN 3.4*   No results for input(s): LIPASE, AMYLASE in the last 168 hours. No results for input(s): AMMONIA in the last 168 hours. Coagulation Profile: No results for input(s): INR, PROTIME in the last 168 hours. Cardiac Enzymes: No results for input(s): CKTOTAL, CKMB, CKMBINDEX, TROPONINI in the last 168 hours. BNP (last 3 results) No results for input(s): PROBNP in the last 8760 hours. HbA1C: No results for input(s): HGBA1C in the last 72 hours. CBG: No results for input(s): GLUCAP in the last 168 hours. Lipid Profile: No results for input(s): CHOL, HDL, LDLCALC, TRIG, CHOLHDL, LDLDIRECT in the last 72 hours. Thyroid Function Tests: No results for input(s): TSH, T4TOTAL, FREET4, T3FREE, THYROIDAB in the last 72 hours. Anemia Panel: No results for input(s): VITAMINB12, FOLATE, FERRITIN, TIBC, IRON, RETICCTPCT in the last 72 hours. Urine analysis: No results found for: COLORURINE, APPEARANCEUR, [place], [place], GLUCOSEU, [place], BILIRUBINUR, KETONESUR, PROTEINUR, UROBILINOGEN, NITRITE, LEUKOCYTESUR  Radiological Exams on Admission: US Abdomen Limited Ruq  Result Date: 11/01/2018 CLINICAL DATA:  Elevated bilirubin with nausea and vomiting EXAM: ULTRASOUND ABDOMEN LIMITED RIGHT  UPPER QUADRANT COMPARISON:  None. FINDINGS: Gallbladder: Surgically removed Common bile duct: Diameter: 7.5 mm. Liver: Mild heterogeneity is noted without discrete mass. Portal vein is patent on color Doppler imaging with normal direction of blood flow towards the liver. IMPRESSION: Mild heterogeneity of the liver which may be related to underlying fatty infiltration. No focal mass is seen. Gallbladder has been surgically removed. Electronically Signed   By: Inez Catalina M.D.   On: 11/01/2018 20:39    EKG: Not performed  Assessment/Plan Principal Problem:   Hematemesis Active Problems:   Symptomatic anemia   Diabetes mellitus without complication (HCC)   Hypertension associated with diabetes ([place])   CKD (chronic kidney disease), stage III (HCC)   Hypothyroidism   Pancytopenia (HCC)  Douglas Melton is a 83 y.o. male with medical history significant for type 2 diabetes, hypertension, hyperlipidemia, CKD stage III, hypothyroidism, and glaucoma who is admitted for symptomatic anemia due to likely upper GI bleed.  Symptomatic anemia due to suspected upper GI bleed: -Admit to progressive care unit -Transfusing 2 units PRBCs -Continue IV Protonix 40 mg twice daily -Octreotide bolus given, patient experienced nausea  and refusing further infusion.  Will attempt with antiemetics if patient agreeable, discussed with nursing. -Continue maintenance IV fluids overnight -GI consulted and will see in a.m. -Will keep n.p.o. at midnight  Pancytopenia: Hemoglobin 5.3, WBC 2.8, and platelets 83,000 on admission. -Follow-up haptoglobin, reticulocyte count, LDH, anemia panel, peripheral smear  Hypertension: Hold home lisinopril and carvedilol while hypovolemic.  Diet controlled type 2 diabetes: Now off of metformin.  Continue to monitor.  Acute on chronic stage III kidney injury: Likely due to hypovolemia and hypoperfusion from anemia.  Continue blood transfusion and IV fluid resuscitation and  recheck labs in a.m.  Hypothyroidism: Continue home Synthroid.   DVT prophylaxis: SCDs Code Status: Full code, confirmed with patient Family Communication: None present on admission Disposition Plan: Ending clinical progress Consults called: GI consulted by EDP, to see in a.m. Admission status: Admit - It is my clinical opinion that admission to INPATIENT is reasonable and necessary because of the expectation that this patient will require hospital care that crosses at least 2 midnights to treat this condition based on the medical complexity of the problems presented.  Given the aforementioned information, the predictability of an adverse outcome is felt to be significant.      Vishal Patel MD [facility]  If 7PM-7AM, please contact night-coverage [email or web address]  11/01/2018, 10:39 PM    

## 2018-11-01 NOTE — ED Notes (Signed)
Lamarius Dirr (wife) [phone]

## 2018-11-01 NOTE — ED Notes (Signed)
ED TO INPATIENT HANDOFF REPORT  ED Nurse Name and Phone #: Annie Main [phone]  S Name/Age/Gender Arn Medal 83 y.o. male Room/Bed: 033C/033C  Code Status   Code Status: Not on file  Home/SNF/Other Home Patient oriented to: self, place, time and situation Is this baseline? Yes   Triage Complete: Triage complete  Chief Complaint Hypotensive  Triage Note Pt here from home with c/o vomiting times a few weeks , wife noticed some blood in it today , b/p sitting up was in the 80's 500 NS given by EMS . S/Bp up to 110 on arrival     Allergies Not on File  Level of Care/Admitting Diagnosis ED Disposition    ED Disposition Condition [place]: [place] [[id number]]  Level of Care: Progressive [102]  Covid Evaluation: Asymptomatic Screening Protocol (No Symptoms)  Diagnosis: Hematemesis [578.0.ICD-9-CM]  Admitting Physician: Lenore Cordia [[id number]]  Attending Physician: Lenore Cordia [[id number]]  Estimated length of stay: past midnight tomorrow  Certification:: I certify this patient will need inpatient services for at least 2 midnights  PT Class (Do Not Modify): Inpatient [101]  PT Acc Code (Do Not Modify): Private [1]       B Medical/Surgery History Past Medical History:  Diagnosis Date  . Diabetes mellitus without complication ([place])    History reviewed. No pertinent surgical history.   A IV Location/Drains/Wounds Patient Lines/Drains/Airways Status   Active Line/Drains/Airways    Name:   Placement date:   Placement time:   Site:   Days:   Peripheral IV 11/01/18 Right Antecubital   11/01/18    1754    Antecubital   less than 1   Peripheral IV 11/01/18 Left Forearm   11/01/18    2107    Forearm   less than 1          Intake/Output Last 24 hours No intake or output data in the 24 hours ending 11/01/18 2118  Labs/Imaging Results for orders placed or performed during the hospital encounter of 11/01/18 (from the past 48  hour(s))  Type and screen     Status: None (Preliminary result)   Collection Time: 11/01/18  5:54 PM  Result Value Ref Range   ABO/RH(D) O POS    Antibody Screen NEG    Sample Expiration 11/04/2018,2359    Unit Number [id number]    Blood Component Type RED CELLS,LR    Unit division 00    Status of Unit ALLOCATED    Transfusion Status OK TO TRANSFUSE    Crossmatch Result Compatible    Unit Number [id number]    Blood Component Type RED CELLS,LR    Unit division 00    Status of Unit ISSUED    Transfusion Status OK TO TRANSFUSE    Crossmatch Result      Compatible Performed at [facility], [address], [place], [place] [address]   ABO/Rh     Status: None   Collection Time: 11/01/18  5:54 PM  Result Value Ref Range   ABO/RH(D)      O POS Performed at [facility] [address] [place], [place] [address]   CBC with Differential     Status: Abnormal   Collection Time: 11/01/18  6:30 PM  Result Value Ref Range   WBC 2.8 (L) 4.0 - 10.5 K/uL   RBC 1.15 (L) 4.22 - 5.81 MIL/uL   Hemoglobin 5.3 (LL) 13.0 - 17.0 g/dL    Comment:  This critical result has verified and been called to S TOWNS,RN by Red Christians on 07 08 2020 at 1918, and has been read back.  REPEATED TO VERIFY SPECIMEN CHECKED FOR CLOTS CORRECTED ON 07/08 AT 1926: PREVIOUSLY REPORTED AS 5.3 This critical result has verified and been called to S TOWNS,RN by Red Christians on 07 08 2020 at 1918, and has been read back.     HCT 15.4 (L) 39.0 - 52.0 %   MCV 133.9 (H) 80.0 - 100.0 fL   MCH 46.1 (H) 26.0 - 34.0 pg   MCHC 34.4 30.0 - 36.0 g/dL   RDW 15.1 11.5 - 15.5 %   Platelets 83 (L) 150 - 400 K/uL    Comment: Immature Platelet Fraction may be clinically indicated, consider ordering this additional test [id number]    nRBC 0.0 0.0 - 0.2 %   Neutrophils Relative % 29 %   Neutro Abs 0.8 (L) 1.7 - 7.7 K/uL   Lymphocytes Relative 65 %   Lymphs Abs 1.8 0.7 - 4.0 K/uL   Monocytes Relative 5 %    Monocytes Absolute 0.2 0.1 - 1.0 K/uL   Eosinophils Relative 1 %   Eosinophils Absolute 0.0 0.0 - 0.5 K/uL   Basophils Relative 0 %   Basophils Absolute 0.0 0.0 - 0.1 K/uL   WBC Morphology ELLIPTOCYTES     Comment: TEARDROP CELLS   Immature Granulocytes 0 %   Abs Immature Granulocytes 0.01 0.00 - 0.07 K/uL    Comment: Performed at [facility], [address], [place], [place] [address]  Basic metabolic panel     Status: Abnormal   Collection Time: 11/01/18  6:30 PM  Result Value Ref Range   Sodium 139 135 - 145 mmol/L   Potassium 4.6 3.5 - 5.1 mmol/L   Chloride 108 98 - 111 mmol/L   CO2 20 (L) 22 - 32 mmol/L   Glucose, Bld 145 (H) 70 - 99 mg/dL   BUN 28 (H) 8 - 23 mg/dL   Creatinine, Ser 1.68 (H) 0.61 - 1.24 mg/dL   Calcium 8.6 (L) 8.9 - 10.3 mg/dL   GFR calc non Af Amer 38 (L) >60 mL/min   GFR calc Af Amer 43 (L) >60 mL/min   Anion gap 11 5 - 15    Comment: Performed at [facility] [address], [place], [place] [address]  Hepatic function panel     Status: Abnormal   Collection Time: 11/01/18  6:30 PM  Result Value Ref Range   Total Protein 5.5 (L) 6.5 - 8.1 g/dL   Albumin 3.4 (L) 3.5 - 5.0 g/dL   AST 23 15 - 41 U/L   ALT 12 0 - 44 U/L   Alkaline Phosphatase 51 38 - 126 U/L   Total Bilirubin 3.4 (H) 0.3 - 1.2 mg/dL   Bilirubin, Direct 1.2 (H) 0.0 - 0.2 mg/dL   Indirect Bilirubin 2.2 (H) 0.3 - 0.9 mg/dL    Comment: Performed at [facility] [address] [place], [place] [address]  Prepare RBC     Status: None   Collection Time: 11/01/18  7:31 PM  Result Value Ref Range   Order Confirmation      ORDER PROCESSED BY BLOOD BANK Performed at [facility], [place] [address] [place], [place] [address]    US Abdomen Limited Ruq  Result Date: 11/01/2018 CLINICAL DATA:  Elevated bilirubin with nausea and vomiting EXAM: ULTRASOUND ABDOMEN LIMITED RIGHT UPPER QUADRANT  COMPARISON:  None. FINDINGS: Gallbladder: Surgically removed Common bile duct:  Diameter: 7.5 mm. Liver: Mild heterogeneity is noted without discrete mass. Portal vein is patent on color Doppler imaging with normal direction of blood flow towards the liver. IMPRESSION: Mild heterogeneity of the liver which may be related to underlying fatty infiltration. No focal mass is seen. Gallbladder has been surgically removed. Electronically Signed   By: Inez Catalina M.D.   On: 11/01/2018 20:39    Pending Labs Unresulted Labs (From admission, onward)    Start     Ordered   11/01/18 1948  Iron and TIBC  Add-on,   AD     11/01/18 1947   11/01/18 1948  Ferritin  Add-on,   AD     11/01/18 1947   11/01/18 1948  Reticulocytes  Add-on,   AD     11/01/18 1947   11/01/18 1947  Haptoglobin  Add-on,   AD     11/01/18 1947   11/01/18 1947  Vitamin B12  Add-on,   AD     11/01/18 1947   11/01/18 1947  Folate  Add-on,   AD     11/01/18 1947   11/01/18 1939  SARS Coronavirus 2 (CEPHEID - Performed in [facility] hospital lab), Hosp Order  (Asymptomatic Patients Labs)  Once,   STAT    Question:  Rule Out  Answer:  Yes   11/01/18 1938          Vitals/Pain Today's Vitals   11/01/18 2015 11/01/18 2030 11/01/18 2045 11/01/18 2100  BP: (!) 115/56 115/63 120/64 129/62  Pulse: 84 (!) 30 96 66  Temp: 98.1 F (36.7 C) 98 F (36.7 C)    SpO2: 99% 100% 100% 100%  PainSc:        Isolation Precautions No active isolations  Medications Medications  ondansetron (ZOFRAN) injection 4 mg (4 mg Intravenous Refused 11/01/18 1813)  0.9 %  sodium chloride infusion (Manually program via Guardrails IV Fluids) (has no administration in time range)  octreotide (SANDOSTATIN) 2 mcg/mL load via infusion 50 mcg (50 mcg Intravenous Bolus from Bag 11/01/18 2107)    And  octreotide (SANDOSTATIN) 500 mcg in sodium chloride 0.9 % 250 mL (2 mcg/mL) infusion (50 mcg/hr Intravenous Refused 11/01/18 2117)  sodium chloride 0.9 % bolus 1,000 mL (0 mLs Intravenous Stopped 11/01/18 1941)  pantoprazole (PROTONIX) injection  40 mg (40 mg Intravenous Given 11/01/18 1941)    Mobility walks with device High fall risk   Focused Assessments Cardiac Assessment Handoff:  Cardiac Rhythm: Sinus tachycardia No results found for: CKTOTAL, CKMB, CKMBINDEX, TROPONINI No results found for: DDIMER Does the Patient currently have chest pain? No      R Recommendations: See Admitting Provider Note  Report given to:   Additional Notes:

## 2018-11-01 NOTE — ED Triage Notes (Signed)
Pt here from home with c/o vomiting times a few weeks , wife noticed some blood in it today , b/p sitting up was in the 80's 500 NS given by EMS . S/Bp up to 110 on arrival

## 2018-11-02 ENCOUNTER — Encounter (HOSPITAL_COMMUNITY): Payer: Self-pay | Admitting: Oncology

## 2018-11-02 ENCOUNTER — Other Ambulatory Visit: Payer: Self-pay | Admitting: Oncology

## 2018-11-02 DIAGNOSIS — D61818 Other pancytopenia: Secondary | ICD-10-CM

## 2018-11-02 DIAGNOSIS — D649 Anemia, unspecified: Secondary | ICD-10-CM

## 2018-11-02 DIAGNOSIS — D539 Nutritional anemia, unspecified: Secondary | ICD-10-CM

## 2018-11-02 DIAGNOSIS — E538 Deficiency of other specified B group vitamins: Secondary | ICD-10-CM

## 2018-11-02 LAB — CBC
HCT: 21.5 % — ABNORMAL LOW (ref 39.0–52.0)
Hemoglobin: 7.4 g/dL — ABNORMAL LOW (ref 13.0–17.0)
MCH: 36.8 pg — ABNORMAL HIGH (ref 26.0–34.0)
MCHC: 34.4 g/dL (ref 30.0–36.0)
MCV: 107 fL — ABNORMAL HIGH (ref 80.0–100.0)
Platelets: 63 10*3/uL — ABNORMAL LOW (ref 150–400)
RBC: 2.01 MIL/uL — ABNORMAL LOW (ref 4.22–5.81)
WBC: 2.8 10*3/uL — ABNORMAL LOW (ref 4.0–10.5)
nRBC: 0.7 % — ABNORMAL HIGH (ref 0.0–0.2)

## 2018-11-02 LAB — BPAM RBC
Blood Product Expiration Date: 202008072359
Blood Product Expiration Date: 202008072359
ISSUE DATE / TIME: 202007081959
ISSUE DATE / TIME: 202007082306
Unit Type and Rh: 5100
Unit Type and Rh: 5100

## 2018-11-02 LAB — BASIC METABOLIC PANEL
Anion gap: 6 (ref 5–15)
BUN: 24 mg/dL — ABNORMAL HIGH (ref 8–23)
CO2: 22 mmol/L (ref 22–32)
Calcium: 7.8 mg/dL — ABNORMAL LOW (ref 8.9–10.3)
Chloride: 112 mmol/L — ABNORMAL HIGH (ref 98–111)
Creatinine, Ser: 1.52 mg/dL — ABNORMAL HIGH (ref 0.61–1.24)
GFR calc Af Amer: 49 mL/min — ABNORMAL LOW (ref >60)
GFR calc non Af Amer: 42 mL/min — ABNORMAL LOW (ref >60)
Glucose, Bld: 104 mg/dL — ABNORMAL HIGH (ref 70–99)
Potassium: 4.4 mmol/L (ref 3.5–5.1)
Sodium: 140 mmol/L (ref 135–145)

## 2018-11-02 LAB — IRON AND TIBC
Iron: 139 ug/dL (ref 45–182)
Saturation Ratios: 83 % — ABNORMAL HIGH (ref 17.9–39.5)
TIBC: 167 ug/dL — ABNORMAL LOW (ref 250–450)
UIBC: 28 ug/dL

## 2018-11-02 LAB — TYPE AND SCREEN
ABO/RH(D): O POS
Antibody Screen: NEGATIVE
Unit division: 0
Unit division: 0

## 2018-11-02 LAB — HEPATIC FUNCTION PANEL
ALT: 10 U/L (ref 0–44)
AST: 21 U/L (ref 15–41)
Albumin: 2.9 g/dL — ABNORMAL LOW (ref 3.5–5.0)
Alkaline Phosphatase: 45 U/L (ref 38–126)
Bilirubin, Direct: 0.7 mg/dL — ABNORMAL HIGH (ref 0.0–0.2)
Indirect Bilirubin: 1.7 mg/dL — ABNORMAL HIGH (ref 0.3–0.9)
Total Bilirubin: 2.4 mg/dL — ABNORMAL HIGH (ref 0.3–1.2)
Total Protein: 4.7 g/dL — ABNORMAL LOW (ref 6.5–8.1)

## 2018-11-02 LAB — MRSA PCR SCREENING: MRSA by PCR: NEGATIVE

## 2018-11-02 LAB — RETICULOCYTES
Immature Retic Fract: 3.4 % (ref 2.3–15.9)
RBC.: 2.03 MIL/uL — ABNORMAL LOW (ref 4.22–5.81)
Retic Count, Absolute: 43.4 10*3/uL (ref 19.0–186.0)
Retic Ct Pct: 2.1 % (ref 0.4–3.1)

## 2018-11-02 LAB — SAVE SMEAR(SSMR), FOR PROVIDER SLIDE REVIEW

## 2018-11-02 LAB — FOLATE: Folate: 6.4 ng/mL (ref >5.9)

## 2018-11-02 LAB — LACTATE DEHYDROGENASE: LDH: 625 U/L — ABNORMAL HIGH (ref 98–192)

## 2018-11-02 LAB — FERRITIN: Ferritin: 58 ng/mL (ref 24–336)

## 2018-11-02 MED ORDER — POTASSIUM CHLORIDE CRYS ER 20 MEQ PO TBCR
20.0000 meq | EXTENDED_RELEASE_TABLET | Freq: Every day | ORAL | Status: AC
  Administered 2018-11-02 – 2018-11-04 (×3): 20 meq via ORAL
  Filled 2018-11-02 (×3): qty 1

## 2018-11-02 MED ORDER — CYANOCOBALAMIN 1000 MCG/ML IJ SOLN
1000.0000 ug | Freq: Every day | INTRAMUSCULAR | Status: DC
  Administered 2018-11-03 – 2018-11-04 (×2): 1000 ug via INTRAMUSCULAR
  Filled 2018-11-02 (×2): qty 1

## 2018-11-02 MED ORDER — FOLIC ACID 1 MG PO TABS
1.0000 mg | ORAL_TABLET | Freq: Every day | ORAL | Status: DC
  Administered 2018-11-02 – 2018-11-04 (×3): 1 mg via ORAL
  Filled 2018-11-02 (×3): qty 1

## 2018-11-02 MED ORDER — CYANOCOBALAMIN 1000 MCG/ML IJ SOLN
1000.0000 ug | Freq: Once | INTRAMUSCULAR | Status: AC
  Administered 2018-11-02: 1000 ug via INTRAMUSCULAR
  Filled 2018-11-02: qty 1

## 2018-11-02 MED ORDER — SODIUM CHLORIDE 0.9 % IV SOLN
INTRAVENOUS | Status: DC
  Administered 2018-11-02: 1000 mL via INTRAVENOUS
  Administered 2018-11-03 – 2018-11-04 (×3): via INTRAVENOUS

## 2018-11-02 NOTE — Progress Notes (Signed)
PROGRESS NOTE    Douglas Melton  VQM:[id number] DOB: 12/23/1935 DOA: 11/01/2018 PCP: [name], MD   Brief Narrative:  Patient is 83 year old male with DM2, hypertension, hyperlipidemia, CKD stage III, hypothyroidism, glaucoma, legal blindness who presented to the emergency department as per his PCP for the evaluation of symptomatic anemia.  Patient reported 3 weeks history of intermittent lightheadedness, dizziness on standing and with activity.  There was no report of black or bloody stools but patient is legally blind.  Found to have hemoglobin of 5.2 on presentation.  Initial work-up was remarkable for leukopenia, thrombocytopenia, macrocytosis, elevated bilirubin, LDH and severe depletion of vitamin B12.  Initially GI was consulted for suspected upper GI bleed but we think that his anemia/pancytopenia is from severe vitamin B deficiency .  Hematology consulted today.  Started on vitamin B12 supplementation.  Assessment & Plan:   Principal Problem:   Hematemesis Active Problems:   Symptomatic anemia   Diabetes mellitus without complication (HCC)   Hypertension associated with diabetes ([place])   CKD (chronic kidney disease), stage III (HCC)   Hypothyroidism   Pancytopenia (HCC)   Symptomatic anemia: Hemoglobin of 5.2 on presentation.  He was transfused with PRBC.  This morning his hemoglobin is in the range of 7.  Has severe microcytosis.  No plan for further GI work-up.  Continue to monitor CBC  Pancytopenia: Most likely associated with vitamin B12 deficiency.  Hematology consulted today.  Also has elevated LDH, elevated bilirubin with increased indirect component. Normal retics.  Vitamin B12 deficiency: Vitamin B12 level marked as 0.  Will give him a dose of 1 mg of vitamin D B12 [place]. Further recommendation as per hematology.  Dizziness/lightheadedness: Most likely associated with symptomatic anemia.  Might also be associated with vitamin B12 deficiency.  Will request for PT  evaluation.  Type 2 diabetes mellitus: Diet-controlled.  Continue to monitor CBGs  Acute on chronic stage III CKD: Kidney function improving.  Last creatinine of 1.43 as per 02/2018 from care everywhere.  Hypothyroidism: Continue Synthyroid  Hypertension: Currently BP stable.  Home antihypertensives on hold.           DVT prophylaxis: SCD Code Status: Full Family Communication: Called wife and give update Disposition Plan: Home in 1 to 2 days.  Waiting for PT evaluation/hematology consultation   Consultants: Hematology, GI  Procedures: None  Antimicrobials:  Anti-infectives (From admission, onward)   None      Subjective:  Patient seen and examined the bedside this morning.  Hemodynamically stable.  Denies any new complaints.  He says he feels dizzy when he stands to walk but otherwise he is ambulatory on baseline. Denies any  chest pain or shortness of breath.  Objective: Vitals:   11/02/18 0423 11/02/18 0611 11/02/18 0758 11/02/18 1200  BP:  119/68 128/70 (!) 129/59  Pulse:  74 72 72  Resp:  13 20 17   Temp: 97.9 F (36.6 C)  98.5 F (36.9 C) 98.7 F (37.1 C)  TempSrc: Axillary  Oral Oral  SpO2:  98% 99% 99%  Weight: 64.8 kg     Height: 6\' 2"  (1.88 m)       Intake/Output Summary (Last 24 hours) at 11/02/2018 1407 Last data filed at 11/02/2018 1224 Gross per 24 hour  Intake 1100.94 ml  Output 200 ml  Net 900.94 ml   Filed Weights   11/01/18 2221 11/02/18 0423  Weight: 64.2 kg 64.8 kg    Examination:  General exam: Appears calm and comfortable ,Not in  distress,average built HEENT:legal blindness Respiratory system: Bilateral equal air entry, normal vesicular breath sounds, no wheezes or crackles  Cardiovascular system: S1 & S2 heard, RRR. No JVD, murmurs, rubs, gallops or clicks. No pedal edema. Gastrointestinal system: Abdomen is nondistended, soft and nontender. No organomegaly or masses felt. Normal bowel sounds heard. Central nervous system:  Alert and oriented. No focal neurological deficits. Extremities: No edema, no clubbing ,no cyanosis, distal peripheral pulses palpable. Skin: No rashes, lesions or ulcers,no icterus ,no pallor  Data Reviewed: I have personally reviewed following labs and imaging studies  CBC: Recent Labs  Lab 11/01/18 1830 11/02/18 0352  WBC 2.8* 2.8*  NEUTROABS 0.8*  --   HGB 5.3* 7.4*  HCT 15.4* 21.5*  MCV 133.9* 107.0*  PLT 83* 63*   Basic Metabolic Panel: Recent Labs  Lab 11/01/18 1830 11/02/18 0352  NA 139 140  K 4.6 4.4  CL 108 112*  CO2 20* 22  GLUCOSE 145* 104*  BUN 28* 24*  CREATININE 1.68* 1.52*  CALCIUM 8.6* 7.8*   GFR: Estimated Creatinine Clearance: 34.9 mL/min (A) (by C-G formula based on SCr of 1.52 mg/dL (H)). Liver Function Tests: Recent Labs  Lab 11/01/18 1830 11/02/18 0352  AST 23 21  ALT 12 10  ALKPHOS 51 45  BILITOT 3.4* 2.4*  PROT 5.5* 4.7*  ALBUMIN 3.4* 2.9*   No results for input(s): LIPASE, AMYLASE in the last 168 hours. No results for input(s): AMMONIA in the last 168 hours. Coagulation Profile: No results for input(s): INR, PROTIME in the last 168 hours. Cardiac Enzymes: No results for input(s): CKTOTAL, CKMB, CKMBINDEX, TROPONINI in the last 168 hours. BNP (last 3 results) No results for input(s): PROBNP in the last 8760 hours. HbA1C: No results for input(s): HGBA1C in the last 72 hours. CBG: No results for input(s): GLUCAP in the last 168 hours. Lipid Profile: No results for input(s): CHOL, HDL, LDLCALC, TRIG, CHOLHDL, LDLDIRECT in the last 72 hours. Thyroid Function Tests: No results for input(s): TSH, T4TOTAL, FREET4, T3FREE, THYROIDAB in the last 72 hours. Anemia Panel: Recent Labs    11/02/18 0352  VITAMINB12 0*  FOLATE 6.4  FERRITIN 58  TIBC 167*  IRON 139  RETICCTPCT 2.1   Sepsis Labs: No results for input(s): PROCALCITON, LATICACIDVEN in the last 168 hours.  Recent Results (from the past 240 hour(s))  SARS Coronavirus 2  (CEPHEID - Performed in [facility] hospital lab), Hosp Order     Status: None   Collection Time: 11/01/18  8:24 PM   Specimen: Nasopharyngeal Swab  Result Value Ref Range Status   SARS Coronavirus 2 NEGATIVE NEGATIVE Final    Comment: (NOTE) If result is NEGATIVE SARS-CoV-2 target nucleic acids are NOT DETECTED. The SARS-CoV-2 RNA is generally detectable in upper and lower  respiratory specimens during the acute phase of infection. The lowest  concentration of SARS-CoV-2 viral copies this assay can detect is 250  copies / mL. A negative result does not preclude SARS-CoV-2 infection  and should not be used as the sole basis for treatment or other  patient management decisions.  A negative result may occur with  improper specimen collection / handling, submission of specimen other  than nasopharyngeal swab, presence of viral mutation(s) within the  areas targeted by this assay, and inadequate number of viral copies  (<250 copies / mL). A negative result must be combined with clinical  observations, patient history, and epidemiological information. If result is POSITIVE SARS-CoV-2 target nucleic acids are DETECTED. The SARS-CoV-2  RNA is generally detectable in upper and lower  respiratory specimens dur ing the acute phase of infection.  Positive  results are indicative of active infection with SARS-CoV-2.  Clinical  correlation with patient history and other diagnostic information is  necessary to determine patient infection status.  Positive results do  not rule out bacterial infection or co-infection with other viruses. If result is PRESUMPTIVE POSTIVE SARS-CoV-2 nucleic acids MAY BE PRESENT.   A presumptive positive result was obtained on the submitted specimen  and confirmed on repeat testing.  While 2019 novel coronavirus  (SARS-CoV-2) nucleic acids may be present in the submitted sample  additional confirmatory testing may be necessary for epidemiological  and / or clinical  management purposes  to differentiate between  SARS-CoV-2 and other Sarbecovirus currently known to infect humans.  If clinically indicated additional testing with an alternate test  methodology [phone]) is advised. The SARS-CoV-2 RNA is generally  detectable in upper and lower respiratory sp ecimens during the acute  phase of infection. The expected result is Negative. Fact Sheet for Patients:  StrictlyIdeas.no Fact Sheet for Healthcare Providers: BankingDealers.co.za This test is not yet approved or cleared by the [place] FDA and has been authorized for detection and/or diagnosis of SARS-CoV-2 by FDA under an Emergency Use Authorization (EUA).  This EUA will remain in effect (meaning this test can be used) for the duration of the COVID-19 declaration under Section 564(b)(1) of the Act, 21 U.S.C. section 360bbb-3(b)(1), unless the authorization is terminated or revoked sooner. Performed at [facility], [place] [address], [place], [place] [address]   MRSA PCR Screening     Status: None   Collection Time: 11/01/18 10:22 PM   Specimen: Nasal Mucosa; Nasopharyngeal  Result Value Ref Range Status   MRSA by PCR NEGATIVE NEGATIVE Final    Comment:        The GeneXpert MRSA Assay (FDA approved for NASAL specimens only), is one component of a comprehensive MRSA colonization surveillance program. It is not intended to diagnose MRSA infection nor to guide or monitor treatment for MRSA infections. Performed at [facility], [place] [address], [place], [place] [address]          Radiology Studies: US Abdomen Limited Ruq  Result Date: 11/01/2018 CLINICAL DATA:  Elevated bilirubin with nausea and vomiting EXAM: ULTRASOUND ABDOMEN LIMITED RIGHT UPPER QUADRANT COMPARISON:  None. FINDINGS: Gallbladder: Surgically removed Common bile duct: Diameter: 7.5 mm. Liver: Mild heterogeneity is noted without discrete mass. Portal vein is  patent on color Doppler imaging with normal direction of blood flow towards the liver. IMPRESSION: Mild heterogeneity of the liver which may be related to underlying fatty infiltration. No focal mass is seen. Gallbladder has been surgically removed. Electronically Signed   By: Inez Catalina M.D.   On: 11/01/2018 20:39        Scheduled Meds: . sodium chloride   Intravenous Once  . cyanocobalamin  1,000 mcg Intramuscular Once  . dorzolamide-timolol  1 drop Left Eye BID  . feeding supplement  1 Container Oral TID BM  . levothyroxine  100 mcg Oral QAC breakfast  . sodium chloride flush  3 mL Intravenous Q12H   Continuous Infusions: . sodium chloride 1,000 mL (11/02/18 1224)     LOS: 1 day    Time spent: 35 mins.More than 50% of that time was spent in counseling and/or coordination of care.      Shelly Coss, MD [facility] Pager [phone]  If 7PM-7AM,  please contact night-coverage [email or web address] Password [email or web address] 11/02/2018, 2:07 PM

## 2018-11-02 NOTE — Progress Notes (Signed)
Patient's wife Silva Bandy was called and updated on her husband's condition. All questions were answered. Will continue to monitor.

## 2018-11-02 NOTE — Progress Notes (Addendum)
[name]: 8:20 AM 11/02/2018  LOS: 1 day    Referring Provider: Dr Tawanna Solo  Primary Care Physician:  [name], MD Primary Gastroenterologist:  None.     Reason for Consultation:  Anemia.  FOBT negative     HPI: Douglas Melton is a 83 y.o. male.  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  DM 2, diet controlled.  Htn.  CKD 3.  Hypothyroidism.  2009 Lap chole 2009 ERCP for jaundice, prominent CBD.  Sphincterotomy, no stone seen on pull through but bile duct clear. Patient recalls remote colonoscopy, but not sure when/where.    1 month progressive dizziness with activity, some presyncope but no syncope.  No SOB, no chest pressure, no swelling, no cough, no palpitations, no sweats, no chills.   Sent by PMD when anemia seen on labwork.    Hgb 5.3 >> 2 U PRBC >> 7.4.  No priors for comp.  MCV 133  Platelets 63 K.   WBC 2.8 B12 is zero, iron and folate ok.   t bili 3.4.   BUN/Creat 28/1.6.   Elevated LDH.    No abd pain, anorexia, weight loss, dysphagia, n/v, change bowel habits, BPR. Caveat is limited vision. No previous iron supplement.  Previous but not current B12 shots.  No previous transfusion.  No unusual bleeding or bruising.  No ASA, NSAIDs.  No ETOH.      Past Medical History:  Diagnosis Date  . Diabetes mellitus without complication [facility])     Past Surgical History:  Procedure Laterality Date  . CHOLECYSTECTOMY      Prior to Admission medications   Medication Sig Start Date End Date Taking? Authorizing Provider  carvedilol (COREG) 6.25 MG tablet Take 6.25 mg by mouth daily. 07/23/15  Yes [provider]  dorzolamide-timolol (COSOPT) 22.3-6.8 MG/ML ophthalmic solution Place 1 drop into the left  eye 2 (two) times daily.   Yes [provider]  levothyroxine (SYNTHROID) 100 MCG tablet Take 100 mcg by mouth daily before breakfast.   Yes [provider]  lisinopril (ZESTRIL) 5 MG tablet Take 5 mg by mouth daily.   Yes [provider]    Scheduled Meds: . sodium chloride   Intravenous Once  . dorzolamide-timolol  1 drop Left Eye BID  . feeding supplement  1 Container Oral TID BM  . levothyroxine  100 mcg Oral QAC breakfast  . pantoprazole (PROTONIX) IV  40 mg Intravenous Q12H  . sodium chloride flush  3 mL Intravenous Q12H   Infusions: . sodium chloride 100 mL/hr at 11/02/18 0327  . octreotide  (SANDOSTATIN)    IV infusion Stopped (11/01/18 2110)   PRN Meds: acetaminophen **OR** acetaminophen, ondansetron **OR** ondansetron (ZOFRAN) IV   Allergies as of 11/01/2018 - Review Complete 11/01/2018  Allergen Reaction Noted  . Adhesive [tape] Rash 11/01/2018  . Fluorescein-benoxinate Rash 08/06/2015  . Latex Rash 11/01/2018    Family History  Problem Relation Age of Onset  . Heart  disease Father   . Hypertension Father     Social History   Socioeconomic History  . Marital status: Married    Spouse name: Not on file  . Number of children: Not on file  . Years of education: Not on file  . Highest education level: Not on file  Occupational History  . Not on file  Social Needs  . Financial resource strain: Not on file  . Food insecurity    Worry: Not on file    Inability: Not on file  . Transportation needs    Medical: Not on file    Non-medical: Not on file  Tobacco Use  . Smoking status: Never Smoker  . Smokeless tobacco: Never Used  Substance and Sexual Activity  . Alcohol use: Never    Frequency: Never  . Drug use: Not on file  . Sexual activity: Not on file  Lifestyle  . Physical activity    Days per week: Not on file    Minutes per session: Not on file  . Stress: Not on file  Relationships  . Social Herbalist on  phone: Not on file    Gets together: Not on file    Attends religious service: Not on file    Active member of club or organization: Not on file    Attends meetings of clubs or organizations: Not on file    Relationship status: Not on file  . Intimate partner violence    Fear of current or ex partner: Not on file    Emotionally abused: Not on file    Physically abused: Not on file    Forced sexual activity: Not on file  Other Topics Concern  . Not on file  Social History Narrative  . Not on file    REVIEW OF SYSTEMS: Constitutional:  Per HPI ENT:  No nose bleeds Pulm:  Per HPI CV:  No palpitations, no LE edema.  GU:  No hematuria, no frequency GI:  Per HPI Heme:  Per HPI   Transfusions: per HPI Neuro:  Per HPI.  No headaches, no peripheral tingling or numbness Derm:  No itching, no rash or sores.  Endocrine:  No sweats or chills.  No polyuria or dysuria Immunization:  Not queried.   Travel:  None beyond local counties in last few months.    PHYSICAL EXAM: Vital signs in last 24 hours: Vitals:   11/02/18 0611 11/02/18 0758  BP: 119/68 128/70  Pulse: 74 72  Resp: 13 20  Temp:  98.5 F (36.9 C)  SpO2: 98% 99%   Wt Readings from Last 3 Encounters:  11/02/18 64.8 kg    General: Pleasant, alert, thin but not cachectic appearing elderly WM Head: No facial asymmetry or swelling.  No signs of head trauma. Eyes: No scleral icterus.  No conjunctival pallor. Ears: Not hard of hearing Nose: No congestion, no discharge. Mouth: Tongue midline.  Mucosa pink, moist, clear.  Fair dentition. Neck: No JVD, no masses, no thyromegaly. Lungs: Clear bilaterally.  No labored breathing.  No cough. Heart: RRR.  No MRG.  S1, S2 present. Abdomen: Soft, nondistended.  Active bowel sounds.  No HSM, bruits, hernias, masses..   Rectal: Orange tinted brown stool, soft, FOBT negative on good sample.  No rectal masses. Musc/Skeltl: No joint redness, swelling or gross deformity present some  arthritic changes in the hands/fingers. Extremities: No CCE.  Good cap refill in the toes. Neurologic: Blind.  alert.  Oriented x3.  Full  limb strength.  No gross deficits.  Good historian, for example even though he is blind he can cite the names of some of his medications. Skin: No rash, no sores, no telangiectasia. Tattoos: None observed. Nodes: No cervical adenopathy. Psych: Calm, pleasant, cooperative.  Intake/Output from previous day: 07/08 0701 - 07/09 0700 In: 900.9 [I.V.:160.9; Blood:740] Out: -  Intake/Output this shift: No intake/output data recorded.  LAB RESULTS: Recent Labs    11/01/18 1830 11/02/18 0352  WBC 2.8* 2.8*  HGB 5.3* 7.4*  HCT 15.4* 21.5*  PLT 83* 63*   BMET Lab Results  Component Value Date   NA 140 11/02/2018   NA 139 11/01/2018   K 4.4 11/02/2018   K 4.6 11/01/2018   CL 112 (H) 11/02/2018   CL 108 11/01/2018   CO2 22 11/02/2018   CO2 20 (L) 11/01/2018   GLUCOSE 104 (H) 11/02/2018   GLUCOSE 145 (H) 11/01/2018   BUN 24 (H) 11/02/2018   BUN 28 (H) 11/01/2018   CREATININE 1.52 (H) 11/02/2018   CREATININE 1.68 (H) 11/01/2018   CALCIUM 7.8 (L) 11/02/2018   CALCIUM 8.6 (L) 11/01/2018   LFT Recent Labs    11/01/18 1830  PROT 5.5*  ALBUMIN 3.4*  AST 23  ALT 12  ALKPHOS 51  BILITOT 3.4*  BILIDIR 1.2*  IBILI 2.2*   PT/INR No results found for: INR, PROTIME  RADIOLOGY STUDIES: US Abdomen Limited Ruq  Result Date: 11/01/2018 CLINICAL DATA:  Elevated bilirubin with nausea and vomiting EXAM: ULTRASOUND ABDOMEN LIMITED RIGHT UPPER QUADRANT COMPARISON:  None. FINDINGS: Gallbladder: Surgically removed Common bile duct: Diameter: 7.5 mm. Liver: Mild heterogeneity is noted without discrete mass. Portal vein is patent on color Doppler imaging with normal direction of blood flow towards the liver. IMPRESSION: Mild heterogeneity of the liver which may be related to underlying fatty infiltration. No focal mass is seen. Gallbladder has been  surgically removed. Electronically Signed   By: Inez Catalina M.D.   On: 11/01/2018 20:39     IMPRESSION:   *   Anemia.  b12 deficient Also pancytopenia.   Patient tolerated this low Hgb quite well, his symptom was dizziness but he had no chest pain, no dyspnea.  *    FOBT negative.  *   CKD 3.     *   T bili elevated but nml alk phos, transaminases.   Heterogenous liver, ? Fatty changes per [place].  Normal PV flow.   *   Blind, glaucoma.     PLAN:     *   Needs B12 injection, defer to Dr Tawanna Solo.  Given that he is not iron deficient and he is FOBT negative, defer decisions regarding endoscopy to Dr. Ardis Hughs.  Patient agreeable to upper endoscopy if required but does not want to undergo colonoscopy.  *   Given no history of melena or GI bleeding and FOBT negative as well as no evidence for cirrhosis, have gone ahead and discontinued IV Protonix and octreotide.  *  Soft, carb mod diet ordered.     Azucena Freed  11/02/2018, 8:20 AM Phone [phone]  ________________________________________________________________________  Velora Heckler GI MD note:  I personally examined the patient, reviewed the data and agree with the assessment and plan described above.  HE has (very) macrocytic anemia, FOBT negative, normal iron stores and no over bleeding. B12 level is very very low and that is what is causing his anemia.  No plans for GI testing, he needs B12 replacement.  Please  call or page with any further questions or concerns.     Owens Loffler, MD [facility] Pager [phone]

## 2018-11-02 NOTE — Consult Note (Addendum)
[place]  [phone] [phone]  ID: Melo Stauber DOB: [birth year] MR#: [id number] PCP: [name], MD  CHIEF COMPLAINT: Pancytopenia  INTERVAL HISTORY: [name] is an 83 year old male from [place], [place] with a past medical history significant for diabetes, hypertension, hyperlipidemia, stage III CKD, hypothyroidism, and glaucoma.  The patient was seen by his primary care provider on the day of admission due to lightheadedness and dizziness and had lab work performed.  He was noted to have a hemoglobin of 5.2 and sent to the emergency room for further evaluation.  In the emergency room, labs were significant for hemoglobin of 5.3, white blood cell count 2.8, platelet count 83,000, elevated MCV at 133.9, BUN 28, creatinine 1.68, total bilirubin 3.4, direct bilirubin 1.2, and indirect bilirubin of 2.2.  He had a right upper quadrant ultrasound which showed changes of surgical removal gallbladder, changes suggestive of fatty infiltration of the liver without focal mass.  The patient has received 2 units of packed red blood cells with improvement of his hemoglobin up to 7.4.  Additional lab work obtained during this hospitalization showed ferritin of 58, iron 139, TIBC 167, percent saturation 83%, UIBC 28, absolute reticulocyte count that is not elevated at 43.4, vitamin B12 level of 0, folate 6.4, LDH elevated at 625.  Stool for occult blood was checked by gastroenterology and was negative.  Haptoglobin has been ordered and is pending.  A prior CBC that was reviewed through care everywhere performed on 4/32,018 showed a white blood cell count of 6.9, hemoglobin 12.6, elevated MCV at 106.2, platelet count 222,000.  No additional CBCs are available for review.  The patient reports that he has been having intermittent lightheadedness/dizziness when standing up and also intermittent nausea and vomiting for the past month.  His wife reported  that she noticed some blood in his emesis.  The patient has poor vision secondary to glaucoma and is unsure if he has had black or bloody stools.  The patient has been evaluated by GI and since his stool for occult blood was negative, no endoscopy or colonoscopy is planned.  Hematology was asked see the patient to make recommendations regarding his pancytopenia.  REVIEW OF SYSTEMS: Today, the patient is unsure if he is still having lightheadedness or dizziness as it only occurs when he is standing up.  He has not been out of bed.  He reports that his nausea and vomiting have resolved.  He denies headaches.  He has been having anorexia secondary to nausea and vomiting but is unsure if he has lost any weight.  Denies night sweats.  Denies palpable adenopathy.  Denies chest discomfort, shortness of breath, cough.  Denies abdominal pain, constipation, diarrhea.  He is unsure if he has had any bleeding since he cannot see secondary to glaucoma.  His wife does not report any other bleeding in the questionable hematemesis.  A comprehensive 14 point review of systems is otherwise negative.  Of note, the patient follows a regular diet.  He does not take any herbal supplements.  PAST MEDICAL HISTORY: Past Medical History:  Diagnosis Date  . Chronic kidney disease   . Diabetes mellitus without complication ([place])   . Glaucoma   . Hyperlipidemia   . Hypertension    PAST SURGICAL HISTORY: Past Surgical History:  Procedure Laterality Date  . CHOLECYSTECTOMY     FAMILY HISTORY Family History  Problem Relation Age of Onset  . Heart disease Father   .  Hypertension Father   . Other Mother     SOCIAL HISTORY: The patient is married. He has 3 adult children and 7 grandchildren.   ADVANCED DIRECTIVES: He has advance directives at home. Did not bring a copy to the hospital. States his wife would be his HCPOA.  HEALTH MAINTENANCE: Social History   Tobacco Use  . Smoking status: Former Smoker    Types:  Cigarettes  . Smokeless tobacco: Never Used  . Tobacco comment: smoked ~1ppd in his 20's. He is unsure how long he smoked.   Substance Use Topics  . Alcohol use: Never    Frequency: Never  . Drug use: Not on file   Colonoscopy: Had a colonoscopy over 10 years ago. He does not recall the date. He states that he does not want to have another colonoscopy.  Bone density: none Lipid panel: 02/2018 PCP office  Allergies  Allergen Reactions  . Adhesive [Tape] Rash  . Fluorescein-Benoxinate Rash    FA dye (Fluress- eyes)    . Latex Rash   Current Facility-Administered Medications  Medication Dose Route Frequency Provider Last Rate Last Dose  . 0.9 %  sodium chloride infusion (Manually program via Guardrails IV Fluids)   Intravenous Once Tyrone Nine, Dan, DO      . 0.9 %  sodium chloride infusion   Intravenous Continuous Adhikari, Amrit, MD 75 mL/hr at 11/02/18 1224 1,000 mL at 11/02/18 1224  . acetaminophen (TYLENOL) tablet 650 mg  650 mg Oral Q6H PRN Lenore Cordia, MD       Or  . acetaminophen (TYLENOL) suppository 650 mg  650 mg Rectal Q6H PRN Zada Finders R, MD      . dorzolamide-timolol (COSOPT) 22.3-6.8 MG/ML ophthalmic solution 1 drop  1 drop Left Eye BID Lenore Cordia, MD   1 drop at 11/02/18 0926  . feeding supplement (BOOST / RESOURCE BREEZE) liquid 1 Container  1 Container Oral TID BM Lenore Cordia, MD   1 Container at 11/02/18 1422  . levothyroxine (SYNTHROID) tablet 100 mcg  100 mcg Oral QAC breakfast Zada Finders R, MD      . ondansetron (ZOFRAN) tablet 4 mg  4 mg Oral Q6H PRN Lenore Cordia, MD       Or  . ondansetron (ZOFRAN) injection 4 mg  4 mg Intravenous Q6H PRN Zada Finders R, MD      . sodium chloride flush (NS) 0.9 % injection 3 mL  3 mL Intravenous Q12H Zada Finders R, MD   3 mL at 11/02/18 0928   OBJECTIVE: Vitals:   11/02/18 0758 11/02/18 1200  BP: 128/70 (!) 129/59  Pulse: 72 72  Resp: 20 17  Temp: 98.5 F (36.9 C) 98.7 F (37.1 C)  SpO2: 99% 99%    Body mass index is 18.34 kg/m. ECOG FS:2 - Symptomatic, <50% confined to bed Ocular: Sclerae anicteric, pupils equal, round and reactive to light, conjunctiva pallor noted Ear-nose-throat: No mucositis or thrush Lymphatic: No cervical or supraclavicular adenopathy Lungs no rales or rhonchi, good excursion bilaterally Heart regular rate and rhythm, no murmur appreciated Abd soft, nontender, positive bowel sounds MSK no focal spinal tenderness, no joint edema Neuro: non-focal, well-oriented, appropriate affect  LAB RESULTS: CMP     Component Value Date/Time   NA 140 11/02/2018 0352   K 4.4 11/02/2018 0352   CL 112 (H) 11/02/2018 0352   CO2 22 11/02/2018 0352   GLUCOSE 104 (H) 11/02/2018 0352   BUN 24 (H) 11/02/2018 9758  CREATININE 1.52 (H) 11/02/2018 0352   CALCIUM 7.8 (L) 11/02/2018 0352   PROT 4.7 (L) 11/02/2018 0352   ALBUMIN 2.9 (L) 11/02/2018 0352   AST 21 11/02/2018 0352   ALT 10 11/02/2018 0352   ALKPHOS 45 11/02/2018 0352   BILITOT 2.4 (H) 11/02/2018 0352   GFRNONAA 42 (L) 11/02/2018 0352   GFRAA 49 (L) 11/02/2018 0352   INo results found for: SPEP, UPEP Lab Results  Component Value Date   WBC 2.8 (L) 11/02/2018   NEUTROABS 0.8 (L) 11/01/2018   HGB 7.4 (L) 11/02/2018   HCT 21.5 (L) 11/02/2018   MCV 107.0 (H) 11/02/2018   PLT 63 (L) 11/02/2018   @LASTCHEMISTRY @ No results found for: LABCA2 No components found for: HERDE081 No results for input(s): INR in the last 168 hours. Urinalysis No results found for: COLORURINE, APPEARANCEUR, LABSPEC, [place], GLUCOSEU, HGBUR, BILIRUBINUR, KETONESUR, PROTEINUR, UROBILINOGEN, NITRITE, LEUKOCYTESUR STUDIES: US Abdomen Limited Ruq  Result Date: 11/01/2018 CLINICAL DATA:  Elevated bilirubin with nausea and vomiting EXAM: ULTRASOUND ABDOMEN LIMITED RIGHT UPPER QUADRANT COMPARISON:  None. FINDINGS: Gallbladder: Surgically removed Common bile duct: Diameter: 7.5 mm. Liver: Mild heterogeneity is noted without discrete mass.  Portal vein is patent on color Doppler imaging with normal direction of blood flow towards the liver. IMPRESSION: Mild heterogeneity of the liver which may be related to underlying fatty infiltration. No focal mass is seen. Gallbladder has been surgically removed. Electronically Signed   By: Inez Catalina M.D.   On: 11/01/2018 20:39   ASSESSMENT: 83 y.o. male from [place], [place] with pancytopenia. He has macrocytic anemia and significant Vitamin B12 deficiency.   PLAN:  Vitamin B12 deficiency may present with anemia, mild leukopenia, and/or thrombocytopenia.  Suspect that his pancytopenia is due to his severe vitamin B12 deficiency.  However, I have ordered a peripheral smear for review.  The etiology of his vitamin B12 deficiency is not clear.  He does not follow a strict vegan diet or had prior surgery that would impair his ability to absorb vitamin B12.  He is not currently on any medications that might impair his ability to absorb vitamin B12 (he was previously on metformin but has been off this for at least a year).  He does have hypothyroidism which could contribute.  I have added on intrinsic factor antibodies.  TSH and T4 were last checked in November 2019.  Will also add these on to his lab work.  The patient has already received 1 dose of vitamin B12 1000 mcg.  Given his severe vitamin B12 deficiency, recommend vitamin B12 1000 mcg subcu daily x1 week, then weekly x1 month, and then once a month dosing.  Recommend supportive transfusions until his counts improve.  Transfuse packed red blood cells for hemoglobin less than 7.0 and transfuse platelets for platelet count less than 10,000 or active bleeding.   Mikey Bussing, NP 11/02/2018 3:16 PM    ADDENDUM: 83 y/o [place] man presenting with confusion, found to have severe anemia and pancytopenia; workup shows absent B-12. Relevant labs include an MCV of 133.9, low reticulocyte count, elevated indirect bilirubin and LDH; no DAT;  haptoglobin pending. Folate is low-normal and iron studies unremarkable/  Mr Buhl has severe B-12 deficiency. The reason for this is not apparent and intrinsic factor antibodies are pending. I concur with plan for daily then weekly B-12 supplementation, then monthly doses indefinitely.  Note that during the early recovery phase he will need folate and potassium supplementation (written).  The elevated LDH and indirect bilirubinemia  are due to ineffective erythropoiesis (red cell precursors dying in the marrow)  Anemia in an elderly patient is generally multifactorial and in this patient anemia of chronic illness and anemia of renal insufficiency also may play a part. As a result his Hb may not fully correct with B-12 supplementation, but so long as Hb is >10 I would not anticipate any symptoms due to anemia.  Will follow with you  Chauncey Cruel, MD Medical Oncology and Hematology [facility] [address] [place], [place] [address] Tel. [phone]    Fax. [phone]

## 2018-11-02 NOTE — Progress Notes (Signed)
PT Cancellation Note  Patient Details Name: Douglas Melton MRN: [id number] DOB: 1935/11/02   Cancelled Treatment:    Reason Eval/Treat Not Completed: Fatigue/lethargy limiting ability to participate.  Agreed to therapy tomorrow.   Ramond Dial 11/02/2018, 4:24 PM  Mee Hives, PT MS Acute Rehab Dept. Number: [place] [facility] [place]

## 2018-11-03 DIAGNOSIS — E1159 Type 2 diabetes mellitus with other circulatory complications: Secondary | ICD-10-CM

## 2018-11-03 DIAGNOSIS — N183 Chronic kidney disease, stage 3 (moderate): Secondary | ICD-10-CM

## 2018-11-03 DIAGNOSIS — E119 Type 2 diabetes mellitus without complications: Secondary | ICD-10-CM

## 2018-11-03 DIAGNOSIS — I10 Essential (primary) hypertension: Secondary | ICD-10-CM

## 2018-11-03 DIAGNOSIS — E039 Hypothyroidism, unspecified: Secondary | ICD-10-CM

## 2018-11-03 DIAGNOSIS — D519 Vitamin B12 deficiency anemia, unspecified: Secondary | ICD-10-CM

## 2018-11-03 LAB — CBC WITH DIFFERENTIAL/PLATELET
Abs Immature Granulocytes: 0.01 10*3/uL (ref 0.00–0.07)
Basophils Absolute: 0 10*3/uL (ref 0.0–0.1)
Basophils Relative: 0 %
Eosinophils Absolute: 0.1 10*3/uL (ref 0.0–0.5)
Eosinophils Relative: 2 %
HCT: 21.8 % — ABNORMAL LOW (ref 39.0–52.0)
Hemoglobin: 7.5 g/dL — ABNORMAL LOW (ref 13.0–17.0)
Immature Granulocytes: 0 %
Lymphocytes Relative: 55 %
Lymphs Abs: 1.8 10*3/uL (ref 0.7–4.0)
MCH: 36.8 pg — ABNORMAL HIGH (ref 26.0–34.0)
MCHC: 34.4 g/dL (ref 30.0–36.0)
MCV: 106.9 fL — ABNORMAL HIGH (ref 80.0–100.0)
Monocytes Absolute: 0.2 10*3/uL (ref 0.1–1.0)
Monocytes Relative: 7 %
Neutro Abs: 1.2 10*3/uL — ABNORMAL LOW (ref 1.7–7.7)
Neutrophils Relative %: 36 %
Platelets: 59 10*3/uL — ABNORMAL LOW (ref 150–400)
RBC: 2.04 MIL/uL — ABNORMAL LOW (ref 4.22–5.81)
WBC: 3.4 10*3/uL — ABNORMAL LOW (ref 4.0–10.5)
nRBC: 0 % (ref 0.0–0.2)

## 2018-11-03 LAB — HAPTOGLOBIN: Haptoglobin: <10 mg/dL — ABNORMAL LOW (ref 38–329)

## 2018-11-03 LAB — BASIC METABOLIC PANEL
Anion gap: 6 (ref 5–15)
BUN: 22 mg/dL (ref 8–23)
CO2: 22 mmol/L (ref 22–32)
Calcium: 8.1 mg/dL — ABNORMAL LOW (ref 8.9–10.3)
Chloride: 111 mmol/L (ref 98–111)
Creatinine, Ser: 1.34 mg/dL — ABNORMAL HIGH (ref 0.61–1.24)
GFR calc Af Amer: 57 mL/min — ABNORMAL LOW (ref >60)
GFR calc non Af Amer: 49 mL/min — ABNORMAL LOW (ref >60)
Glucose, Bld: 91 mg/dL (ref 70–99)
Potassium: 4.1 mmol/L (ref 3.5–5.1)
Sodium: 139 mmol/L (ref 135–145)

## 2018-11-03 LAB — VITAMIN B12: Vitamin B-12: <50 pg/mL — ABNORMAL LOW (ref 180–914)

## 2018-11-03 LAB — T4, FREE: Free T4: 1.05 ng/dL (ref 0.61–1.12)

## 2018-11-03 LAB — TSH: TSH: 3.991 u[IU]/mL (ref 0.350–4.500)

## 2018-11-03 LAB — INTRINSIC FACTOR ANTIBODIES: Intrinsic Factor: 25.4 AU/mL — ABNORMAL HIGH (ref 0.0–1.1)

## 2018-11-03 LAB — PATHOLOGIST SMEAR REVIEW

## 2018-11-03 NOTE — TOC Initial Note (Addendum)
Transition of Care [facility]) - Initial/Assessment Note    Patient Details  Name: Douglas Melton MRN: [id number] Date of Birth: 05/21/1935  Transition [facility]) CM/SW Contact:    Benard Halsted, LCSW Phone Number: 11/03/2018, 2:16 PM  Clinical Narrative:                 CSW received consult for possible SNF placement at time of discharge. CSW spoke with patient regarding PT recommendation of SNF placement at time of discharge. Patient reported that he does not want to go to SNF and that his wife will assist him at home. CSW also offered home health. Patient refused home health as well and stated if he goes home and ends up needing it he will contact his PCP. Patient declined equipment needs and states he has a walker at home. No further questions reported at this time.   Expected Discharge Plan: Home/Self Care Barriers to Discharge: Continued Medical Work up   Patient Goals and CMS Choice Patient states their goals for this hospitalization and ongoing recovery are:: Return home CMS Medicare.gov Compare Post Acute Care list provided to:: Patient Choice offered to / list presented to : Patient  Expected Discharge Plan and Services Expected Discharge Plan: Home/Self Care In-house Referral: Clinical Social Work   Post Acute Care Choice: NA Living arrangements for the past 2 months: Single Family Home                 DME Arranged: N/A         HH Arranged: Refused SNF, Refused HH          Prior Living Arrangements/Services Living arrangements for the past 2 months: Single Family Home Lives with:: Spouse Patient language and need for interpreter reviewed:: Yes Do you feel safe going back to the place where you live?: Yes      Need for Family Participation in Patient Care: No (Comment) Care giver support system in place?: Yes (comment) Current home services: DME Criminal Activity/Legal Involvement Pertinent to Current Situation/Hospitalization: No - Comment as  needed  Activities of Daily Living Home Assistive Devices/Equipment: Cane (specify quad or straight), Walker (specify type) ADL Screening (condition at time of admission) Patient's cognitive ability adequate to safely complete daily activities?: Yes Is the patient deaf or have difficulty hearing?: No Does the patient have difficulty seeing, even when wearing glasses/contacts?: Yes Does the patient have difficulty concentrating, remembering, or making decisions?: No Patient able to express need for assistance with ADLs?: Yes Does the patient have difficulty dressing or bathing?: Yes Independently performs ADLs?: No Communication: Independent Dressing (OT): Needs assistance Is this a change from baseline?: Change from baseline, expected to last <3days Grooming: Independent Feeding: Independent Bathing: Needs assistance Is this a change from baseline?: Change from baseline, expected to last <3 days Toileting: Needs assistance Is this a change from baseline?: Change from baseline, expected to last <3 days In/Out Bed: Needs assistance Is this a change from baseline?: Change from baseline, expected to last <3 days Walks in Home: Needs assistance Is this a change from baseline?: Change from baseline, expected to last <3 days Does the patient have difficulty walking or climbing stairs?: Yes Weakness of Legs: Both Weakness of Arms/Hands: None  Permission Sought/Granted                  Emotional Assessment Appearance:: Appears stated age Attitude/Demeanor/Rapport: Engaged Affect (typically observed): Accepting, Quiet Orientation: : Oriented to Self, Oriented to Place, Oriented to  Time, Oriented  to Situation Alcohol / Substance Use: Not Applicable Psych Involvement: No (comment)  Admission diagnosis:  Hyperbilirubinemia [E80.6] Symptomatic anemia [D64.9] Patient Active Problem List   Diagnosis Date Noted  . Hematemesis 11/01/2018  . Symptomatic anemia 11/01/2018  . Diabetes  mellitus without complication ([place])   . Hypertension associated with diabetes ([place])   . CKD (chronic kidney disease), stage III ([place])   . Hypothyroidism   . Pancytopenia ([place])    PCP:  [name], MD Pharmacy:   [id number] - [place], [place] - [address] [place]  [address] Phone: [phone] Fax: [phone]     Social Determinants of Health (SDOH) Interventions    Readmission Risk Interventions No flowsheet data found.

## 2018-11-03 NOTE — Progress Notes (Signed)
Spoke with pt's wife Silva Bandy with updates on POC. Told her to call anytime if she had other questions.

## 2018-11-03 NOTE — Progress Notes (Signed)
PROGRESS NOTE    Douglas Melton  HER:[id number]  DOB: [birth year]  DOA: 11/01/2018 PCP: [name], MD  Brief Narrative:  83 year old male with DM2, hypertension, hyperlipidemia, CKD stage III, hypothyroidism, glaucoma, legal blindness who presented to the emergency department as per his PCP for the evaluation of symptomatic anemia.  Patient reported 3 weeks history of intermittent lightheadedness, dizziness on standing and with activity.  There was no report of black or bloody stools but patient is legally blind.  Found to have hemoglobin of 5.2 on presentation.  Initial work-up was remarkable for leukopenia, thrombocytopenia, macrocytosis, elevated bilirubin, LDH and severe depletion of vitamin B12.  Initially GI was consulted for suspected upper GI bleed but we think that his anemia/pancytopenia is from severe vitamin B deficiency Hematology consulted and patient Started on vitamin B12 supplementation.   Subjective:  Patient is legally blind.  He states he lives with his wife.  Feels better.  Objective: Vitals:   11/03/18 0518 11/03/18 0827 11/03/18 1647 11/03/18 2009  BP:   94/67   Pulse: 66     Resp: 16     Temp:  98.8 F (37.1 C) 98.3 F (36.8 C) 98.1 F (36.7 C)  TempSrc:  Oral Oral Oral  SpO2: 97%     Weight: 64.2 kg     Height:        Intake/Output Summary (Last 24 hours) at 11/03/2018 2026 Last data filed at 11/03/2018 1707 Gross per 24 hour  Intake 2585.91 ml  Output 950 ml  Net 1635.91 ml   Filed Weights   11/01/18 2221 11/02/18 0423 11/03/18 0518  Weight: 64.2 kg 64.8 kg 64.2 kg    Physical Examination:  General exam: Appears calm and comfortable  Respiratory system: Clear to auscultation. Respiratory effort normal. Cardiovascular system: S1 & S2 heard, RRR. No JVD, murmurs, rubs, gallops or clicks. No pedal edema. Gastrointestinal system: Abdomen is nondistended, soft and nontender. No organomegaly or masses felt. Normal bowel sounds heard. Central  nervous system: Alert and oriented. No focal neurological deficits. Extremities: Symmetric 5 x 5 power. Skin: No rashes, lesions or ulcers Psychiatry: Judgement and insight appear normal. Mood & affect appropriate.     Data Reviewed: I have personally reviewed following labs and imaging studies  CBC: Recent Labs  Lab 11/01/18 1830 11/02/18 0352 11/03/18 0234  WBC 2.8* 2.8* 3.4*  NEUTROABS 0.8*  --  1.2*  HGB 5.3* 7.4* 7.5*  HCT 15.4* 21.5* 21.8*  MCV 133.9* 107.0* 106.9*  PLT 83* 63* 59*   Basic Metabolic Panel: Recent Labs  Lab 11/01/18 1830 11/02/18 0352 11/03/18 0234  NA 139 140 139  K 4.6 4.4 4.1  CL 108 112* 111  CO2 20* 22 22  GLUCOSE 145* 104* 91  BUN 28* 24* 22  CREATININE 1.68* 1.52* 1.34*  CALCIUM 8.6* 7.8* 8.1*   GFR: Estimated Creatinine Clearance: 39.3 mL/min (A) (by C-G formula based on SCr of 1.34 mg/dL (H)). Liver Function Tests: Recent Labs  Lab 11/01/18 1830 11/02/18 0352  AST 23 21  ALT 12 10  ALKPHOS 51 45  BILITOT 3.4* 2.4*  PROT 5.5* 4.7*  ALBUMIN 3.4* 2.9*   No results for input(s): LIPASE, AMYLASE in the last 168 hours. No results for input(s): AMMONIA in the last 168 hours. Coagulation Profile: No results for input(s): INR, PROTIME in the last 168 hours. Cardiac Enzymes: No results for input(s): CKTOTAL, CKMB, CKMBINDEX, TROPONINI in the last 168 hours. BNP (last 3 results) No results for input(s): PROBNP  in the last 8760 hours. HbA1C: No results for input(s): HGBA1C in the last 72 hours. CBG: No results for input(s): GLUCAP in the last 168 hours. Lipid Profile: No results for input(s): CHOL, HDL, LDLCALC, TRIG, CHOLHDL, LDLDIRECT in the last 72 hours. Thyroid Function Tests: Recent Labs    11/03/18 0234  TSH 3.991  FREET4 1.05   Anemia Panel: Recent Labs    11/02/18 0352  VITAMINB12 <50*  FOLATE 6.4  FERRITIN 58  TIBC 167*  IRON 139  RETICCTPCT 2.1   Sepsis Labs: No results for input(s): PROCALCITON,  LATICACIDVEN in the last 168 hours.  Recent Results (from the past 240 hour(s))  SARS Coronavirus 2 (CEPHEID - Performed in [facility] hospital lab), Hosp Order     Status: None   Collection Time: 11/01/18  8:24 PM   Specimen: Nasopharyngeal Swab  Result Value Ref Range Status   SARS Coronavirus 2 NEGATIVE NEGATIVE Final    Comment: (NOTE) If result is NEGATIVE SARS-CoV-2 target nucleic acids are NOT DETECTED. The SARS-CoV-2 RNA is generally detectable in upper and lower  respiratory specimens during the acute phase of infection. The lowest  concentration of SARS-CoV-2 viral copies this assay can detect is 250  copies / mL. A negative result does not preclude SARS-CoV-2 infection  and should not be used as the sole basis for treatment or other  patient management decisions.  A negative result may occur with  improper specimen collection / handling, submission of specimen other  than nasopharyngeal swab, presence of viral mutation(s) within the  areas targeted by this assay, and inadequate number of viral copies  (<250 copies / mL). A negative result must be combined with clinical  observations, patient history, and epidemiological information. If result is POSITIVE SARS-CoV-2 target nucleic acids are DETECTED. The SARS-CoV-2 RNA is generally detectable in upper and lower  respiratory specimens dur ing the acute phase of infection.  Positive  results are indicative of active infection with SARS-CoV-2.  Clinical  correlation with patient history and other diagnostic information is  necessary to determine patient infection status.  Positive results do  not rule out bacterial infection or co-infection with other viruses. If result is PRESUMPTIVE POSTIVE SARS-CoV-2 nucleic acids MAY BE PRESENT.   A presumptive positive result was obtained on the submitted specimen  and confirmed on repeat testing.  While 2019 novel coronavirus  (SARS-CoV-2) nucleic acids may be present in the submitted  sample  additional confirmatory testing may be necessary for epidemiological  and / or clinical management purposes  to differentiate between  SARS-CoV-2 and other Sarbecovirus currently known to infect humans.  If clinically indicated additional testing with an alternate test  methodology [phone]) is advised. The SARS-CoV-2 RNA is generally  detectable in upper and lower respiratory sp ecimens during the acute  phase of infection. The expected result is Negative. Fact Sheet for Patients:  StrictlyIdeas.no Fact Sheet for Healthcare Providers: BankingDealers.co.za This test is not yet approved or cleared by the [place] FDA and has been authorized for detection and/or diagnosis of SARS-CoV-2 by FDA under an Emergency Use Authorization (EUA).  This EUA will remain in effect (meaning this test can be used) for the duration of the COVID-19 declaration under Section 564(b)(1) of the Act, 21 U.S.C. section 360bbb-3(b)(1), unless the authorization is terminated or revoked sooner. Performed at [facility], [place] [address], [place], [place] [address]   MRSA PCR Screening     Status: None   Collection Time: 11/01/18  10:22 PM   Specimen: Nasal Mucosa; Nasopharyngeal  Result Value Ref Range Status   MRSA by PCR NEGATIVE NEGATIVE Final    Comment:        The GeneXpert MRSA Assay (FDA approved for NASAL specimens only), is one component of a comprehensive MRSA colonization surveillance program. It is not intended to diagnose MRSA infection nor to guide or monitor treatment for MRSA infections. Performed at [facility], [place] [address], [place], [place] [address]       Radiology Studies: US Abdomen Limited Ruq  Result Date: 11/01/2018 CLINICAL DATA:  Elevated bilirubin with nausea and vomiting EXAM: ULTRASOUND ABDOMEN LIMITED RIGHT UPPER QUADRANT COMPARISON:  None. FINDINGS: Gallbladder: Surgically removed Common bile duct:  Diameter: 7.5 mm. Liver: Mild heterogeneity is noted without discrete mass. Portal vein is patent on color Doppler imaging with normal direction of blood flow towards the liver. IMPRESSION: Mild heterogeneity of the liver which may be related to underlying fatty infiltration. No focal mass is seen. Gallbladder has been surgically removed. Electronically Signed   By: Inez Catalina M.D.   On: 11/01/2018 20:39        Scheduled Meds: . sodium chloride   Intravenous Once  . cyanocobalamin  1,000 mcg Intramuscular Daily  . dorzolamide-timolol  1 drop Left Eye BID  . feeding supplement  1 Container Oral TID BM  . folic acid  1 mg Oral Daily  . levothyroxine  100 mcg Oral QAC breakfast  . potassium chloride  20 mEq Oral Daily  . sodium chloride flush  3 mL Intravenous Q12H   Continuous Infusions: . sodium chloride 75 mL/hr at 11/03/18 1647    Assessment & Plan:   Symptomatic anemia: Hemoglobin of 5.2 on presentation.  He was transfused with PRBC. Now his hemoglobin is in the range of 7.  Has severe microcytosis.  No plan for further GI work-up.  Continue to monitor CBC.  Noted to have severe B12 deficiency.  Seen by hematology and started on parenteral B12. Patient can be discharged after IM daily regimen completed, he will follow-up for weekly shots/monthly shots subsequently.  Alternatively wife could be taught to administer home injections.  Pancytopenia: Most likely associated with vitamin B12 deficiency.Also has elevated LDH, elevated bilirubin with increased indirect component. Normal retics.  Type 2 diabetes mellitus: Diet-controlled.  Continue to monitor CBGs  Acute on chronic stage III CKD: Kidney function improving.  Last creatinine of 1.43 as per 02/2018 from care everywhere.  DC IV fluids.  Hypothyroidism: Continue Synthyroid  Hypertension: Currently BP stable.  Home antihypertensives on hold.      DVT prophylaxis: Ambulation/SCDs Code Status: Full code Family /  Patient Communication: Discussed with patient Disposition Plan: Home in a.m. likely     LOS: 2 days    Time spent: 25 minutes    Guilford Shi, MD [facility] Pager [phone]  If 7PM-7AM, please contact night-coverage [email or web address] Password [facility] 11/03/2018, 8:26 PM

## 2018-11-03 NOTE — Evaluation (Signed)
Physical Therapy Evaluation Patient Details Name: Douglas Melton MRN: [id number] DOB: 02/27/1936 Today's Date: 11/03/2018   History of Present Illness  Patient is a 83 y/o male presenting to the ED on 11/01/2018 wiht primary complaints of lightheadedness/dizziness - noted low Hgb. Admitted for Symptomatic anemia due to suspected upper GI bleed. PMH significant of DM2, hypertension, hyperlipidemia, CKD stage III, hypothyroidism, glaucoma, legal blindness.    Clinical Impression  Patient admitted with the above listed diagnosis. Patient reports he lives at home with his wife with patient ambulatory within the home with RW - states he has been mainly sedentary for past month due to dizziness/lightheadedness. Patient also reporting blindness limiting mobility. Patient today requiring Min A for functional transfers with noted instability and unsteadiness even with cueing. While in standing for pericare patient stating "I need to lay back down", however once in supine stating "I could walk if it wasn't for all these wires" - will continue to assess mobility. Currently due to functional status and increased fall risk, will recommend SNF at discharge with PT to continue to follow acutely. If patient/family declines SNF will need [place] services.     Follow Up Recommendations SNF;Supervision/Assistance - 24 hour    Equipment Recommendations  None recommended by PT    Recommendations for Other Services       Precautions / Restrictions Precautions Precautions: Fall Precaution Comments: blindness Restrictions Weight Bearing Restrictions: No      Mobility  Bed Mobility Overal bed mobility: Needs Assistance Bed Mobility: Supine to Sit;Sit to Supine     Supine to sit: Min assist;Min guard Sit to supine: Min assist;Min guard   General bed mobility comments: for set-up and line management  Transfers Overall transfer level: Needs assistance Equipment used: Rolling walker (2 wheeled) Transfers: Sit  to/from [facility] Sit to Stand: Min assist Stand pivot transfers: Min assist       General transfer comment: Min A to from bed and bedside commode; patient with flexed posture throughout; consistent verbal cueing for safety and sequencing with use of RW  Ambulation/Gait             General Gait Details: deffered - while in standing patient stating "I need to lay down" - VSS  Stairs            Wheelchair Mobility    Modified Rankin (Stroke Patients Only)       Balance Overall balance assessment: Needs assistance Sitting-balance support: Bilateral upper extremity supported;Feet supported Sitting balance-Leahy Scale: Fair     Standing balance support: Bilateral upper extremity supported;During functional activity Standing balance-Leahy Scale: Poor                               Pertinent Vitals/Pain Pain Assessment: No/denies pain    Home Living Family/patient expects to be discharged to:: Private residence Living Arrangements: Spouse/significant other Available Help at Discharge: Family;Available 24 hours/day Type of Home: House Home Access: Stairs to enter(enters from basement with chair lift)     Home Layout: One level Home Equipment: Shower seat - built in;Grab bars - tub/shower;Walker - 2 wheels      Prior Function Level of Independence: Needs assistance   Gait / Transfers Assistance Needed: use of RW for short household distances  ADL's / Homemaking Assistance Needed: wife assist with meals   Comments: reports ebing sedentary over the last month due to vision changes      Hand Dominance  Extremity/Trunk Assessment   Upper Extremity Assessment Upper Extremity Assessment: Defer to OT evaluation    Lower Extremity Assessment Lower Extremity Assessment: Generalized weakness    Cervical / Trunk Assessment Cervical / Trunk Assessment: Kyphotic  Communication   Communication: No difficulties  Cognition  Arousal/Alertness: Awake/alert Behavior During Therapy: WFL for tasks assessed/performed Overall Cognitive Status: Within Functional Limits for tasks assessed                                 General Comments: requires increased time for processing, otherwise appropriate      General Comments General comments (skin integrity, edema, etc.): unsure how long patient has been blind - reports worsening of vision over the past month?    Exercises     Assessment/Plan    PT Assessment Patient needs continued PT services  PT Problem List Decreased strength;Decreased activity tolerance;Decreased balance;Decreased mobility;Decreased knowledge of use of DME;Decreased safety awareness       PT Treatment Interventions DME instruction;Gait training;Functional mobility training;Therapeutic exercise;Therapeutic activities;Balance training;Patient/family education    PT Goals (Current goals can be found in the Care Plan section)  Acute Rehab PT Goals Patient Stated Goal: none stated PT Goal Formulation: With patient Time For Goal Achievement: 11/17/18 Potential to Achieve Goals: Fair    Frequency Min 3X/week   Barriers to discharge        Co-evaluation               AM-PAC PT "6 Clicks" Mobility  Outcome Measure Help needed turning from your back to your side while in a flat bed without using bedrails?: A Little Help needed moving from lying on your back to sitting on the side of a flat bed without using bedrails?: A Little Help needed moving to and from a bed to a chair (including a wheelchair)?: A Little Help needed standing up from a chair using your arms (e.g., wheelchair or bedside chair)?: A Little Help needed to walk in hospital room?: A Lot Help needed climbing 3-5 steps with a railing? : Total 6 Click Score: 15    End of Session Equipment Utilized During Treatment: Gait belt Activity Tolerance: Patient tolerated treatment well Patient left: in bed;with  call bell/phone within reach;with bed alarm set Nurse Communication: Mobility status PT Visit Diagnosis: Unsteadiness on feet (R26.81);Other abnormalities of gait and mobility (R26.89);Muscle weakness (generalized) (M62.81)    Time: 8416-6063 PT Time Calculation (min) (ACUTE ONLY): 29 min   Charges:   PT Evaluation $PT Eval Moderate Complexity: 1 Mod PT Treatments $Therapeutic Activity: 8-22 mins         Lanney Gins, PT, DPT Supplemental Physical Therapist 11/03/18 12:11 PM Pager: [phone] Office: [phone]

## 2018-11-03 NOTE — Progress Notes (Signed)
I met with Douglas Melton this AM and discussed his situation in detail. 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 The IF test has been sent and is pending. I anticipate he will need parenteral B-12 replacement indefinitely.  He works closely with his PCP, Dr Kathryne Eriksson, and wishes to receive his B-12 supplementation through his office. Please arrange for follow-up with Dr Redmond Pulling at discharge.  Will sign off at this point.

## 2018-11-03 NOTE — Care Management Important Message (Signed)
Important Message  Patient Details  Name: Douglas Melton MRN: [id number] Date of Birth: 1935/12/19   Medicare Important Message Given:  Yes     Orbie Pyo 11/03/2018, 2:57 PM

## 2018-11-04 DIAGNOSIS — N189 Chronic kidney disease, unspecified: Secondary | ICD-10-CM

## 2018-11-04 DIAGNOSIS — N179 Acute kidney failure, unspecified: Secondary | ICD-10-CM

## 2018-11-04 DIAGNOSIS — E861 Hypovolemia: Secondary | ICD-10-CM

## 2018-11-04 DIAGNOSIS — I9589 Other hypotension: Secondary | ICD-10-CM

## 2018-11-04 LAB — T3, FREE: T3, Free: 1.4 pg/mL — ABNORMAL LOW (ref 2.0–4.4)

## 2018-11-04 MED ORDER — ADULT MULTIVITAMIN W/MINERALS CH: 1.0000 | ORAL_TABLET | Freq: Every day | ORAL | Status: DC

## 2018-11-04 MED ORDER — ADULT MULTIVITAMIN W/MINERALS CH: 1.0000 | ORAL_TABLET | Freq: Every day | ORAL | 1 refills | Status: DC

## 2018-11-04 MED ORDER — FOLIC ACID 1 MG PO TABS: 1.0000 mg | ORAL_TABLET | Freq: Every day | ORAL | 0 refills | Status: AC

## 2018-11-04 MED ORDER — CYANOCOBALAMIN 1000 MCG/ML IJ SOLN: 1000.0000 ug | INTRAMUSCULAR | 0 refills | Status: AC

## 2018-11-04 MED ORDER — ENSURE ENLIVE PO LIQD: 237.0000 mL | Freq: Two times a day (BID) | ORAL | Status: DC

## 2018-11-04 NOTE — Progress Notes (Signed)
Initial Nutrition Assessment   RD working remotely.   DOCUMENTATION CODES:   Underweight  INTERVENTION:   Ensure Enlive po BID, each supplement provides 350 kcal and 20 grams of protein  Add MVI with Minerals  NUTRITION DIAGNOSIS:   Inadequate oral intake related to acute illness, decreased appetite as evidenced by per patient/family report.  GOAL:   Patient will meet greater than or equal to 90% of their needs   MONITOR:   PO intake, Supplement acceptance, Labs, Weight trends  REASON FOR ASSESSMENT:   Malnutrition Screening Tool    ASSESSMENT:   83 yo male admitted with symptomatic anemia related to severe B-12 deficiency. PMH includes DM, CKD III, HTN, hypothyroidism. Pt is legally blind.  Unable to reach pt via phone; RD working remotely  Recorded po intake 68% on average; appetite fair  No previous weight encounters, current wt 65.2 kg; admit wt 64.2 kg.   Labs: Vitamin B-12 <00 (L) Meds: folic acid, T-62 injections  NUTRITION - FOCUSED PHYSICAL EXAM:  Unable to perform, working remotely  Diet Order:   Diet Order            Diet - low sodium heart healthy        DIET SOFT Room service appropriate? No; Fluid consistency: Thin  Diet effective now              EDUCATION NEEDS:   Not appropriate for education at this time  Skin:  Skin Assessment: Reviewed RN Assessment  Last BM:  7/11  Height:   Ht Readings from Last 1 Encounters:  11/02/18 6\' 2"  (1.88 m)    Weight:   Wt Readings from Last 1 Encounters:  11/04/18 65.2 kg    Ideal Body Weight:  86.4 kg  BMI:  Body mass index is 18.46 kg/m.  Estimated Nutritional Needs:   Kcal:  1670-1870 kcals  Protein:  84-94 g  Fluid:  >/= 1.6 L   Cate Mirela Parsley MS, RDN, LDN, CNSC [phone] Pager  [phone] Weekend/On-Call Pager

## 2018-11-04 NOTE — Discharge Summary (Signed)
Physician Discharge Summary  Douglas Melton VVO:[id number] DOB: [birth year] DOA: 11/01/2018  PCP: [name], MD  Admit date: 11/01/2018 Discharge date: 11/04/2018 Consultations:  Admitted From:  Disposition:   Discharge Diagnoses:  Principal Problem:   Hematemesis Active Problems:   Symptomatic anemia   Diabetes mellitus without complication ([place])   Hypertension associated with diabetes ([place])   CKD (chronic kidney disease), stage III (HCC)   Hypothyroidism   Pancytopenia [facility])   Hospital Course Summary:  83 year old male withDM2, hypertension, hyperlipidemia, CKD stage III, hypothyroidism, glaucoma, legal blindness who presented to the emergency department per his PCP advice for evaluation of symptomatic anemia. Patient reported 3 weeks history of intermittent lightheadedness, dizziness on standing and with activity. There was no report of black or bloody stools but patient is legally blind. Found to have hemoglobin of 5.2 on presentation. Initial work-up was remarkable for leukopenia, thrombocytopenia, macrocytosis, elevated bilirubin, LDH and severe depletion of vitamin B12. Initially GI was consulted for suspected upper GI bleed but we think that his anemia/pancytopenia is from severe vitamin B deficiency Hematology consulted and patientStarted on vitamin B12 supplementation.  Symptomatic anemia: Hemoglobin of 5.2 on presentation. He was transfused with PRBC. Now his hemoglobin is in the range of 7. Work up revealed severe B12 deficiency. Seen by hematology and further w/u sent including Intrinsic factor level. Stool guaic -ve.No plan for further GI work-up as inpatient but will need outpatient f/u with hematology regarding IF antibodies and further advice. He reports undergoing EGD in 2008 and states "I woldnt want any more of those".  Patient started on parenteral B12 and received 3 IM shots while in hospital. Patient feels better and ready for discharge and cleared by  hematology. He plans to f/u with his PCP on Monday for resuming weekly and subsequent monthly B12 shots. If patient unable to visit PCP clinic weekly due to the pandemic , he can alternatively be prescribed oral B12 (unless pernicious anemia work up positive) . Alternatively wife could be taught to administer home injections.Patient as of now confident about PCP follow up.   Hypertension:Patient had soft Bps on arrival and during hospital course. Home antihypertensives (coreg/Lisinopril) held and patient received IV hydration.. Currently off IV fluids and supine SBP in low 110-120s with no significant orthostatic change. Will continue to hold home meds until PCP f/u  Pancytopenia:Most likely associated with vitamin B12 deficiency.Also has elevated LDH, elevated bilirubin with increased indirect component. Normal retics.Seen by Hematology, f/u in their clinic in 2 weeks  Type 2 diabetes mellitus: Diet-controlled. Continue to monitor CBGs  Acute on chronic stage III YIR:SWNIOE function improved after holding ACEI and with IV hydration. Lastcreatinine of 1.43 as per 02/2018 from care everywhere.   Hypothyroidism: Continue Synthyroid    Discharge Exam:  Vitals:   11/04/18 1205 11/04/18 1228  BP:  108/75  Pulse:  67  Resp:  (!) 22  Temp: 98.4 F (36.9 C) 98.2 F (36.8 C)  SpO2:  99%   Vitals:   11/04/18 0625 11/04/18 0830 11/04/18 1205 11/04/18 1228  BP:    108/75  Pulse:    67  Resp:    (!) 22  Temp:  97.8 F (36.6 C) 98.4 F (36.9 C) 98.2 F (36.8 C)  TempSrc:    Oral  SpO2:    99%  Weight: 65.2 kg     Height:        General: Pt is alert, awake, not in acute distress. Legally blind Cardiovascular: RRR, S1/S2 +, no rubs,  no gallops Respiratory: CTA bilaterally, no wheezing, no rhonchi Abdominal: Soft, NT, ND, bowel sounds + Extremities: no edema, no cyanosis  Discharge Condition:Stable CODE STATUS: Full code Diet recommendation: Low salt diet Recommendations  for Outpatient Follow-up:  1. Follow up with PCP on  Monday 2. Follow up with consultants: Hematology Dr Jana Hakim in 2 -3 weeks as needed 3. Please follow up labs including:Intrinsic factor antibodies . CBC in 1 week  Home Health services upon discharge: no Equipment/Devices upon discharge:none   Discharge Instructions:  Discharge Instructions    Call MD for:  extreme fatigue   Complete by: As directed    Call MD for:  persistant dizziness or light-headedness   Complete by: As directed    Diet - low sodium heart healthy   Complete by: As directed    Increase activity slowly   Complete by: As directed      Allergies as of 11/04/2018      Reactions   Adhesive [tape] Rash   Fluorescein-benoxinate Rash   FA dye (Fluress- eyes)   Latex Rash      Medication List    STOP taking these medications   carvedilol 6.25 MG tablet Commonly known as: COREG   lisinopril 5 MG tablet Commonly known as: ZESTRIL     TAKE these medications   cyanocobalamin 1000 MCG/ML injection Commonly known as: (VITAMIN B-12) Inject 1 mL (1,000 mcg total) into the muscle once a week.   dorzolamide-timolol 22.3-6.8 MG/ML ophthalmic solution Commonly known as: COSOPT Place 1 drop into the left eye 2 (two) times daily.   folic acid 1 MG tablet Commonly known as: FOLVITE Take 1 tablet (1 mg total) by mouth daily for 14 days. Start taking on: November 05, 2018   levothyroxine 100 MCG tablet Commonly known as: SYNTHROID Take 100 mcg by mouth daily before breakfast.   multivitamin with minerals Tabs tablet Take 1 tablet by mouth daily.       Allergies  Allergen Reactions  . Adhesive [Tape] Rash  . Fluorescein-Benoxinate Rash    FA dye (Fluress- eyes)    . Latex Rash      The results of significant diagnostics from this hospitalization (including imaging, microbiology, ancillary and laboratory) are listed below for reference.    Labs: BNP (last 3 results) No results for input(s): BNP in  the last 8760 hours. Basic Metabolic Panel: Recent Labs  Lab 11/01/18 1830 11/02/18 0352 11/03/18 0234  NA 139 140 139  K 4.6 4.4 4.1  CL 108 112* 111  CO2 20* 22 22  GLUCOSE 145* 104* 91  BUN 28* 24* 22  CREATININE 1.68* 1.52* 1.34*  CALCIUM 8.6* 7.8* 8.1*   Liver Function Tests: Recent Labs  Lab 11/01/18 1830 11/02/18 0352  AST 23 21  ALT 12 10  ALKPHOS 51 45  BILITOT 3.4* 2.4*  PROT 5.5* 4.7*  ALBUMIN 3.4* 2.9*   No results for input(s): LIPASE, AMYLASE in the last 168 hours. No results for input(s): AMMONIA in the last 168 hours. CBC: Recent Labs  Lab 11/01/18 1830 11/02/18 0352 11/03/18 0234  WBC 2.8* 2.8* 3.4*  NEUTROABS 0.8*  --  1.2*  HGB 5.3* 7.4* 7.5*  HCT 15.4* 21.5* 21.8*  MCV 133.9* 107.0* 106.9*  PLT 83* 63* 59*   Cardiac Enzymes: No results for input(s): CKTOTAL, CKMB, CKMBINDEX, TROPONINI in the last 168 hours. BNP: Invalid input(s): POCBNP CBG: No results for input(s): GLUCAP in the last 168 hours. D-Dimer No results for input(s): DDIMER in  the last 72 hours. Hgb A1c No results for input(s): HGBA1C in the last 72 hours. Lipid Profile No results for input(s): CHOL, HDL, LDLCALC, TRIG, CHOLHDL, LDLDIRECT in the last 72 hours. Thyroid function studies Recent Labs    11/03/18 0234  TSH 3.991  T3FREE 1.4*   Anemia work up Recent Labs    11/02/18 0352  VITAMINB12 <50*  FOLATE 6.4  FERRITIN 58  TIBC 167*  IRON 139  RETICCTPCT 2.1   Urinalysis No results found for: COLORURINE, APPEARANCEUR, LABSPEC, PHURINE, GLUCOSEU, HGBUR, BILIRUBINUR, KETONESUR, PROTEINUR, UROBILINOGEN, NITRITE, LEUKOCYTESUR Sepsis Labs Invalid input(s): PROCALCITONIN,  WBC,  LACTICIDVEN Microbiology Recent Results (from the past 240 hour(s))  SARS Coronavirus 2 (CEPHEID - Performed in [place] hospital lab), Hosp Order     Status: None   Collection Time: 11/01/18  8:24 PM   Specimen: Nasopharyngeal Swab  Result Value Ref Range Status   SARS Coronavirus  2 NEGATIVE NEGATIVE Final    Comment: (NOTE) If result is NEGATIVE SARS-CoV-2 target nucleic acids are NOT DETECTED. The SARS-CoV-2 RNA is generally detectable in upper and lower  respiratory specimens during the acute phase of infection. The lowest  concentration of SARS-CoV-2 viral copies this assay can detect is 250  copies / mL. A negative result does not preclude SARS-CoV-2 infection  and should not be used as the sole basis for treatment or other  patient management decisions.  A negative result may occur with  improper specimen collection / handling, submission of specimen other  than nasopharyngeal swab, presence of viral mutation(s) within the  areas targeted by this assay, and inadequate number of viral copies  (<250 copies / mL). A negative result must be combined with clinical  observations, patient history, and epidemiological information. If result is POSITIVE SARS-CoV-2 target nucleic acids are DETECTED. The SARS-CoV-2 RNA is generally detectable in upper and lower  respiratory specimens dur ing the acute phase of infection.  Positive  results are indicative of active infection with SARS-CoV-2.  Clinical  correlation with patient history and other diagnostic information is  necessary to determine patient infection status.  Positive results do  not rule out bacterial infection or co-infection with other viruses. If result is PRESUMPTIVE POSTIVE SARS-CoV-2 nucleic acids MAY BE PRESENT.   A presumptive positive result was obtained on the submitted specimen  and confirmed on repeat testing.  While 2019 novel coronavirus  (SARS-CoV-2) nucleic acids may be present in the submitted sample  additional confirmatory testing may be necessary for epidemiological  and / or clinical management purposes  to differentiate between  SARS-CoV-2 and other Sarbecovirus currently known to infect humans.  If clinically indicated additional testing with an alternate test  methodology  [phone]) is advised. The SARS-CoV-2 RNA is generally  detectable in upper and lower respiratory sp ecimens during the acute  phase of infection. The expected result is Negative. Fact Sheet for Patients:  StrictlyIdeas.no Fact Sheet for Healthcare Providers: BankingDealers.co.za This test is not yet approved or cleared by the [place] FDA and has been authorized for detection and/or diagnosis of SARS-CoV-2 by FDA under an Emergency Use Authorization (EUA).  This EUA will remain in effect (meaning this test can be used) for the duration of the COVID-19 declaration under Section 564(b)(1) of the Act, 21 U.S.C. section 360bbb-3(b)(1), unless the authorization is terminated or revoked sooner. Performed at [facility], [place] [address] [place], [place] [address]   MRSA PCR Screening     Status: None  Collection Time: 11/01/18 10:22 PM   Specimen: Nasal Mucosa; Nasopharyngeal  Result Value Ref Range Status   MRSA by PCR NEGATIVE NEGATIVE Final    Comment:        The GeneXpert MRSA Assay (FDA approved for NASAL specimens only), is one component of a comprehensive MRSA colonization surveillance program. It is not intended to diagnose MRSA infection nor to guide or monitor treatment for MRSA infections. Performed at [facility], [place] [address] [place], [place] [address]     Procedures/Studies: US Abdomen Limited Ruq  Result Date: 11/01/2018 CLINICAL DATA:  Elevated bilirubin with nausea and vomiting EXAM: ULTRASOUND ABDOMEN LIMITED RIGHT UPPER QUADRANT COMPARISON:  None. FINDINGS: Gallbladder: Surgically removed Common bile duct: Diameter: 7.5 mm. Liver: Mild heterogeneity is noted without discrete mass. Portal vein is patent on color Doppler imaging with normal direction of blood flow towards the liver. IMPRESSION: Mild heterogeneity of the liver which may be related to underlying fatty infiltration. No focal mass is  seen. Gallbladder has been surgically removed. Electronically Signed   By: Inez Catalina M.D.   On: 11/01/2018 20:39    Time coordinating discharge: Over 30 minutes  SIGNED:   Guilford Shi, MD  [facility] 11/04/2018, 2:34 PM Pager : [phone]

## 2018-11-04 NOTE — Plan of Care (Signed)
  Problem: Activity: Goal: Risk for activity intolerance will decrease Outcome: Completed/Met

## 2020-02-14 ENCOUNTER — Emergency Department (HOSPITAL_COMMUNITY): Payer: Medicare Other

## 2020-02-14 ENCOUNTER — Inpatient Hospital Stay (HOSPITAL_COMMUNITY)
Admission: EM | Admit: 2020-02-14 | Discharge: 2020-02-20 | DRG: 522 | Disposition: A | Discharge status: Skilled Nursing Facility | Payer: Medicare Other | Attending: Family Medicine | Admitting: Family Medicine

## 2020-02-14 ENCOUNTER — Encounter (HOSPITAL_COMMUNITY): Payer: Self-pay

## 2020-02-14 DIAGNOSIS — Z20822 Contact with and (suspected) exposure to covid-19: Secondary | ICD-10-CM | POA: Diagnosis present

## 2020-02-14 DIAGNOSIS — W19XXXA Unspecified fall, initial encounter: Secondary | ICD-10-CM | POA: Diagnosis present

## 2020-02-14 DIAGNOSIS — Z91040 Latex allergy status: Secondary | ICD-10-CM

## 2020-02-14 DIAGNOSIS — Z79899 Other long term (current) drug therapy: Secondary | ICD-10-CM | POA: Diagnosis not present

## 2020-02-14 DIAGNOSIS — E785 Hyperlipidemia, unspecified: Secondary | ICD-10-CM | POA: Diagnosis present

## 2020-02-14 DIAGNOSIS — E1169 Type 2 diabetes mellitus with other specified complication: Secondary | ICD-10-CM | POA: Diagnosis present

## 2020-02-14 DIAGNOSIS — Y92009 Unspecified place in unspecified non-institutional (private) residence as the place of occurrence of the external cause: Secondary | ICD-10-CM

## 2020-02-14 DIAGNOSIS — N183 Chronic kidney disease, stage 3 unspecified: Secondary | ICD-10-CM | POA: Diagnosis present

## 2020-02-14 DIAGNOSIS — R29898 Other symptoms and signs involving the musculoskeletal system: Secondary | ICD-10-CM

## 2020-02-14 DIAGNOSIS — Z8249 Family history of ischemic heart disease and other diseases of the circulatory system: Secondary | ICD-10-CM | POA: Diagnosis not present

## 2020-02-14 DIAGNOSIS — E039 Hypothyroidism, unspecified: Secondary | ICD-10-CM | POA: Diagnosis present

## 2020-02-14 DIAGNOSIS — H409 Unspecified glaucoma: Secondary | ICD-10-CM | POA: Diagnosis present

## 2020-02-14 DIAGNOSIS — Z79890 Hormone replacement therapy (postmenopausal): Secondary | ICD-10-CM

## 2020-02-14 DIAGNOSIS — Z87891 Personal history of nicotine dependence: Secondary | ICD-10-CM

## 2020-02-14 DIAGNOSIS — H547 Unspecified visual loss: Secondary | ICD-10-CM | POA: Diagnosis present

## 2020-02-14 DIAGNOSIS — I440 Atrioventricular block, first degree: Secondary | ICD-10-CM | POA: Diagnosis present

## 2020-02-14 DIAGNOSIS — E119 Type 2 diabetes mellitus without complications: Secondary | ICD-10-CM

## 2020-02-14 DIAGNOSIS — Z419 Encounter for procedure for purposes other than remedying health state, unspecified: Secondary | ICD-10-CM

## 2020-02-14 DIAGNOSIS — E1122 Type 2 diabetes mellitus with diabetic chronic kidney disease: Secondary | ICD-10-CM | POA: Diagnosis present

## 2020-02-14 DIAGNOSIS — S72002A Fracture of unspecified part of neck of left femur, initial encounter for closed fracture: Secondary | ICD-10-CM | POA: Diagnosis present

## 2020-02-14 DIAGNOSIS — N1831 Chronic kidney disease, stage 3a: Secondary | ICD-10-CM | POA: Diagnosis present

## 2020-02-14 DIAGNOSIS — Z888 Allergy status to other drugs, medicaments and biological substances status: Secondary | ICD-10-CM | POA: Diagnosis not present

## 2020-02-14 DIAGNOSIS — I129 Hypertensive chronic kidney disease with stage 1 through stage 4 chronic kidney disease, or unspecified chronic kidney disease: Secondary | ICD-10-CM | POA: Diagnosis present

## 2020-02-14 DIAGNOSIS — D62 Acute posthemorrhagic anemia: Secondary | ICD-10-CM | POA: Diagnosis not present

## 2020-02-14 DIAGNOSIS — E1159 Type 2 diabetes mellitus with other circulatory complications: Secondary | ICD-10-CM | POA: Diagnosis present

## 2020-02-14 DIAGNOSIS — I152 Hypertension secondary to endocrine disorders: Secondary | ICD-10-CM | POA: Diagnosis present

## 2020-02-14 DIAGNOSIS — S72012A Unspecified intracapsular fracture of left femur, initial encounter for closed fracture: Secondary | ICD-10-CM | POA: Diagnosis present

## 2020-02-14 DIAGNOSIS — Z7984 Long term (current) use of oral hypoglycemic drugs: Secondary | ICD-10-CM

## 2020-02-14 LAB — CBC WITH DIFFERENTIAL/PLATELET
Abs Immature Granulocytes: 0.03 10*3/uL (ref 0.00–0.07)
Basophils Absolute: 0.1 10*3/uL (ref 0.0–0.1)
Basophils Relative: 2 %
Eosinophils Absolute: 0.2 10*3/uL (ref 0.0–0.5)
Eosinophils Relative: 3 %
HCT: 38.1 % — ABNORMAL LOW (ref 39.0–52.0)
Hemoglobin: 12.1 g/dL — ABNORMAL LOW (ref 13.0–17.0)
Immature Granulocytes: 0 %
Lymphocytes Relative: 26 %
Lymphs Abs: 2 10*3/uL (ref 0.7–4.0)
MCH: 33.1 pg (ref 26.0–34.0)
MCHC: 31.8 g/dL (ref 30.0–36.0)
MCV: 104.1 fL — ABNORMAL HIGH (ref 80.0–100.0)
Monocytes Absolute: 0.6 10*3/uL (ref 0.1–1.0)
Monocytes Relative: 8 %
Neutro Abs: 4.6 10*3/uL (ref 1.7–7.7)
Neutrophils Relative %: 61 %
Platelets: 287 10*3/uL (ref 150–400)
RBC: 3.66 MIL/uL — ABNORMAL LOW (ref 4.22–5.81)
RDW: 12.7 % (ref 11.5–15.5)
WBC: 7.5 10*3/uL (ref 4.0–10.5)
nRBC: 0 % (ref 0.0–0.2)

## 2020-02-14 LAB — COMPREHENSIVE METABOLIC PANEL
ALT: 12 U/L (ref 0–44)
AST: 15 U/L (ref 15–41)
Albumin: 3.5 g/dL (ref 3.5–5.0)
Alkaline Phosphatase: 89 U/L (ref 38–126)
Anion gap: 10 (ref 5–15)
BUN: 18 mg/dL (ref 8–23)
CO2: 19 mmol/L — ABNORMAL LOW (ref 22–32)
Calcium: 9 mg/dL (ref 8.9–10.3)
Chloride: 110 mmol/L (ref 98–111)
Creatinine, Ser: 1.55 mg/dL — ABNORMAL HIGH (ref 0.61–1.24)
GFR, Estimated: 44 mL/min — ABNORMAL LOW (ref >60)
Glucose, Bld: 182 mg/dL — ABNORMAL HIGH (ref 70–99)
Potassium: 4.6 mmol/L (ref 3.5–5.1)
Sodium: 139 mmol/L (ref 135–145)
Total Bilirubin: 0.6 mg/dL (ref 0.3–1.2)
Total Protein: 6.3 g/dL — ABNORMAL LOW (ref 6.5–8.1)

## 2020-02-14 LAB — RESPIRATORY PANEL BY RT PCR (FLU A&B, COVID)
Influenza A by PCR: NEGATIVE
Influenza B by PCR: NEGATIVE
SARS Coronavirus 2 by RT PCR: NEGATIVE

## 2020-02-14 LAB — PROTIME-INR
INR: 1.1 (ref 0.8–1.2)
Prothrombin Time: 14.1 seconds (ref 11.4–15.2)

## 2020-02-14 LAB — CBG MONITORING, ED: Glucose-Capillary: 190 mg/dL — ABNORMAL HIGH (ref 70–99)

## 2020-02-14 MED ORDER — ONDANSETRON HCL 4 MG/2ML IJ SOLN: 4.0000 mg | Freq: Once | INTRAMUSCULAR | Status: DC

## 2020-02-14 MED ORDER — INSULIN ASPART 100 UNIT/ML ~~LOC~~ SOLN
0.0000 [IU] | Freq: Three times a day (TID) | SUBCUTANEOUS | Status: DC
  Administered 2020-02-16 (×3): 2 [IU] via SUBCUTANEOUS
  Administered 2020-02-17: 3 [IU] via SUBCUTANEOUS
  Administered 2020-02-17 – 2020-02-18 (×2): 2 [IU] via SUBCUTANEOUS
  Administered 2020-02-18 (×2): 1 [IU] via SUBCUTANEOUS
  Administered 2020-02-19: 2 [IU] via SUBCUTANEOUS

## 2020-02-14 MED ORDER — LEVOTHYROXINE SODIUM 88 MCG PO TABS
88.0000 ug | ORAL_TABLET | Freq: Every day | ORAL | Status: DC
  Administered 2020-02-16 – 2020-02-20 (×5): 88 ug via ORAL
  Filled 2020-02-14 (×6): qty 1

## 2020-02-14 MED ORDER — PROCHLORPERAZINE EDISYLATE 10 MG/2ML IJ SOLN
10.0000 mg | Freq: Once | INTRAMUSCULAR | Status: AC
  Administered 2020-02-14: 10 mg via INTRAVENOUS
  Filled 2020-02-14: qty 2

## 2020-02-14 MED ORDER — ADULT MULTIVITAMIN W/MINERALS CH
1.0000 | ORAL_TABLET | Freq: Every day | ORAL | Status: DC
  Administered 2020-02-17 – 2020-02-20 (×4): 1 via ORAL
  Filled 2020-02-14 (×5): qty 1

## 2020-02-14 MED ORDER — MORPHINE SULFATE (PF) 2 MG/ML IV SOLN
0.5000 mg | INTRAVENOUS | Status: DC | PRN
  Administered 2020-02-14 – 2020-02-15 (×2): 0.5 mg via INTRAVENOUS
  Filled 2020-02-14 (×2): qty 1

## 2020-02-14 MED ORDER — DORZOLAMIDE HCL-TIMOLOL MAL 2-0.5 % OP SOLN
1.0000 [drp] | Freq: Two times a day (BID) | OPHTHALMIC | Status: DC
  Filled 2020-02-14 (×2): qty 10

## 2020-02-14 MED ORDER — POLYETHYLENE GLYCOL 3350 17 G PO PACK: 17.0000 g | PACK | Freq: Every day | ORAL | Status: DC | PRN

## 2020-02-14 MED ORDER — ONDANSETRON HCL 4 MG/2ML IJ SOLN: 4.0000 mg | Freq: Four times a day (QID) | INTRAMUSCULAR | Status: DC | PRN

## 2020-02-14 MED ORDER — HYDROCODONE-ACETAMINOPHEN 5-325 MG PO TABS: 1.0000 | ORAL_TABLET | Freq: Four times a day (QID) | ORAL | Status: DC | PRN

## 2020-02-14 NOTE — ED Notes (Signed)
Admitting doc dr Trilby Drummer  at bedside

## 2020-02-14 NOTE — H&P (Signed)
History and Physical   Douglas Melton SWN:[id number] DOB: 1936/03/13 DOA: 02/14/2020  PCP: [name], MD   Patient coming from: Home  Chief Complaint: Fall  HPI: Douglas Melton is a 84 y.o. male with medical history significant of anemia, diabetes, hypertension, CKD 3, hypothyroidism, blindness who presents following a fall at home.  Patient was attempting to move a drawer anterior chest of drawers when he tripped over a table.  He states he fell onto his left side and immediately noticed pain in his left hip.  He did not hit his head nor loose consciousness. His pain has been controlled in the ED. He is not on any blood thinners.  He denies fever, cough, chest pain, shortness of breath, abdominal pain, constipation, diarrhea.  He reports some nausea after a period of time in the ED.  ED Course: Vital signs stable in ED.  Labs notable for creatinine of 1.5 but this is consistent with known CKD 3.  Hemoglobin stable at 12.  Respiratory panel negative.  Type and screen obtained in ED. EKG showed showed sinus rhythm with first-degree AV block and prolonged QTC to 539 intraventricular conduction delay. Chest x-ray showed no acute disease. Hip x-ray showed mildly displaced left femoral neck fracture. Orthopedics consulted in the ED plans for n.p.o. at midnight with possible surgery tomorrow pending schedule.   Review of Systems: As per HPI otherwise all other systems reviewed and are negative.  Past Medical History:  Diagnosis Date  . Chronic kidney disease   . Diabetes mellitus without complication ([place])   . Glaucoma   . Hyperlipidemia   . Hypertension     Past Surgical History:  Procedure Laterality Date  . CHOLECYSTECTOMY      Social History  reports that he has quit smoking. His smoking use included cigarettes. He has never used smokeless tobacco. He reports that he does not drink alcohol and does not use drugs.  Allergies  Allergen Reactions  . Adhesive [Tape] Rash  .  Fluorescein-Benoxinate Rash    FA dye (Fluress- eyes)    . Latex Rash    Family History  Problem Relation Age of Onset  . Heart disease Father   . Hypertension Father   . Other Mother   Reviewed on admissioned on admission  Prior to Admission medications   Medication Sig Start Date End Date Taking? Authorizing Provider  dorzolamide-timolol (COSOPT) 22.3-6.8 MG/ML ophthalmic solution Place 1 drop into the left eye 2 (two) times daily.    [provider]  levothyroxine (SYNTHROID) 100 MCG tablet Take 100 mcg by mouth daily before breakfast.    [provider]  Multiple Vitamin (MULTIVITAMIN WITH MINERALS) TABS tablet Take 1 tablet by mouth daily. 11/04/18   Guilford Shi, MD    Physical Exam: Vitals:   02/14/20 2200 02/14/20 2215 02/14/20 2300 02/15/20 0000  BP: 139/78  126/61 118/77  Pulse: 93 98 92 88  Resp: 16 18 12 13   Temp:      TempSrc:      SpO2: 98% 98% 97% 97%   Physical Exam Constitutional:      General: He is not in acute distress.    Appearance: Normal appearance.  HENT:     Head: Normocephalic and atraumatic.     Mouth/Throat:     Mouth: Mucous membranes are moist.     Pharynx: Oropharynx is clear.  Eyes:     Extraocular Movements: Extraocular movements intact.     Pupils: Pupils are equal, round, and reactive  to light.     Comments: Chronic blindness  Cardiovascular:     Rate and Rhythm: Normal rate and regular rhythm.     Pulses: Normal pulses.     Heart sounds: Normal heart sounds.  Pulmonary:     Effort: Pulmonary effort is normal. No respiratory distress.     Breath sounds: Normal breath sounds.  Abdominal:     General: Bowel sounds are normal. There is no distension.     Palpations: Abdomen is soft.     Tenderness: There is no abdominal tenderness.  Musculoskeletal:     Comments: Left hip tenderness Left leg shorter than right Motor and sensation intact in left foot Pulses intact in left foot  Skin:    General: Skin is  warm and dry.  Neurological:     General: No focal deficit present.     Mental Status: Mental status is at baseline.    Labs on Admission: I have personally reviewed following labs and imaging studies  CBC: Recent Labs  Lab 02/14/20 1631  WBC 7.5  NEUTROABS 4.6  HGB 12.1*  HCT 38.1*  MCV 104.1*  PLT 621    Basic Metabolic Panel: Recent Labs  Lab 02/14/20 1631  NA 139  K 4.6  CL 110  CO2 19*  GLUCOSE 182*  BUN 18  CREATININE 1.55*  CALCIUM 9.0    GFR: CrCl cannot be calculated (Unknown ideal weight.).  Liver Function Tests: Recent Labs  Lab 02/14/20 1631  AST 15  ALT 12  ALKPHOS 89  BILITOT 0.6  PROT 6.3*  ALBUMIN 3.5    Urine analysis: No results found for: COLORURINE, APPEARANCEUR, LABSPEC, PHURINE, GLUCOSEU, HGBUR, BILIRUBINUR, KETONESUR, PROTEINUR, UROBILINOGEN, NITRITE, LEUKOCYTESUR  Radiological Exams on Admission: DG Chest 1 View  Result Date: 02/14/2020 CLINICAL DATA:  Recent fall with chest pain, possible hip fracture, initial encounter EXAM: CHEST  1 VIEW COMPARISON:  12/17/2007 FINDINGS: Cardiac shadow is stable. Aortic calcifications are noted. The lungs are well aerated bilaterally. No focal infiltrate or sizable effusion is seen. No bony abnormality is noted. IMPRESSION: No active disease. Electronically Signed   By: Inez Catalina M.D.   On: 02/14/2020 17:37   DG Hip Unilat W or Wo Pelvis 2-3 Views Left  Result Date: 02/14/2020 CLINICAL DATA:  Fall with shortening of the left leg EXAM: DG HIP (WITH OR WITHOUT PELVIS) 2-3V LEFT COMPARISON:  None. FINDINGS: SI joints are non widened. Zipper artifact on the images. Right femoral head projects in joint. Pubic symphysis appears intact. Probable chronic fracture deformity at the left inferior pubic ramus. Acute mildly displaced left femoral neck fracture with mild cranial migration of the trochanter. No left femoral head dislocation. Extensive vascular calcification IMPRESSION: Acute mildly displaced  left femoral neck fracture. Probable chronic fracture deformity of the left inferior pubic ramus. Electronically Signed   By: Donavan Foil M.D.   On: 02/14/2020 17:34    EKG: Independently reviewed. sinus rhythm with first-degree AV block and prolonged QTC to 539 intraventricular conduction delay  Assessment/Plan Principal Problem:   Left displaced femoral neck fracture (HCC) Active Problems:   Diabetes mellitus without complication (HCC)   Hypertension associated with diabetes ([place])   CKD (chronic kidney disease), stage III (HCC)   Hypothyroidism  Left displaced femoral neck fracture - Orthopedics consulted in ED, possibility of surgery tomorrow - N.p.o. at midnight - Pain control with Norco every 6 hours as needed for moderate pain and morphine 0.2 mg every 4 hours as needed for severe  pain - One-time dose of Compazine for nausea - SCDs, likely surgery tomorrow  Prolonged QT - QTC elevated to 539 - Avoid QTC prolonging medications - Monitor on telemetry - Did receive one-time dose of Compazine for nausea  CKD 3 - Creatinine at baseline of 1.5 admission  Hypothyroidism - Continue home Synthroid  Diabetes - Takes Metformin at home - Sliding scale insulin here  Hypertension -Continue home lisinopril  DVT prophylaxis: SCDs Code Status:   Full Family Communication:  Wife, Silva Bandy, was updated by EDP, but left before patient interview  Disposition Plan:   Patient is from:  Home  Anticipated DC to:  Likely SNF, pending PT evaluation  Anticipated DC date:  Pending clinical course    Consults called:  Orthopedics, Dr. Griffin Basil Admission status:  Inpatient, telemetry  Severity of Illness: The appropriate patient status for this patient is INPATIENT. Inpatient status is judged to be reasonable and necessary in order to provide the required intensity of service to ensure the patient's safety. The patient's presenting symptoms, physical exam findings, and initial radiographic  and laboratory data in the context of their chronic comorbidities is felt to place them at high risk for further clinical deterioration. Furthermore, it is not anticipated that the patient will be medically stable for discharge from the hospital within 2 midnights of admission. The following factors support the patient status of inpatient.   " The patient's presenting symptoms include hip pain. " The worrisome physical exam findings include left hip pain, left leg shortening. " The initial radiographic and laboratory data are worrisome because of left femoral neck fracture. " The chronic co-morbidities include diabetes, CKD, hypertension, hypothyroidism, blindness.   * I certify that at the point of admission it is my clinical judgment that the patient will require inpatient hospital care spanning beyond 2 midnights from the point of admission due to high intensity of service, high risk for further deterioration and high frequency of surveillance required.Marcelyn Bruins MD [facility]  How to contact the [facility] Attending or Consulting provider [place] or covering provider during after hours [place], for this patient?   1. Check the care team in [facility] and look for a) attending/consulting [facility] provider listed and b) the [facility] team listed 2. Log into [email or web address] and use [place]'s universal password to access. If you do not have the password, please contact the hospital operator. 3. Locate the [facility] provider you are looking for under [facility] and page to a number that you can be directly reached. 4. If you still have difficulty reaching the provider, please page the Ambulatory Care Center (Director on Call) for the Hospitalists listed on amion for assistance.  02/15/2020, 12:31 AM

## 2020-02-14 NOTE — ED Triage Notes (Signed)
Pt had a mechanical fall at home. Pt tripped and fell and started having L hip pain. Pt denies hitting head and is not on any blood thinners. Shortening to the L leg noted. Pt denies neck/back pain. Pt alert and oriented x4.

## 2020-02-14 NOTE — ED Provider Notes (Signed)
[place] EMERGENCY DEPARTMENT Provider Note   CSN: [id number] Arrival date & time: 02/14/20  1627     History Chief Complaint  Patient presents with  . Fall    Douglas Melton is a 84 y.o. male.  The history is provided by the patient and medical records. No language interpreter was used.  Fall This is a new problem. The current episode started less than 1 hour ago. The problem occurs rarely. The problem has not changed since onset.Pertinent negatives include no chest pain, no abdominal pain, no headaches and no shortness of breath. Nothing aggravates the symptoms. Nothing relieves the symptoms. He has tried nothing for the symptoms. The treatment provided no relief.       Past Medical History:  Diagnosis Date  . Chronic kidney disease   . Diabetes mellitus without complication ([place])   . Glaucoma   . Hyperlipidemia   . Hypertension     Patient Active Problem List   Diagnosis Date Noted  . Hematemesis 11/01/2018  . Symptomatic anemia 11/01/2018  . Diabetes mellitus without complication ([place])   . Hypertension associated with diabetes ([place])   . CKD (chronic kidney disease), stage III ([place])   . Hypothyroidism   . Pancytopenia [facility])     Past Surgical History:  Procedure Laterality Date  . CHOLECYSTECTOMY         Family History  Problem Relation Age of Onset  . Heart disease Father   . Hypertension Father   . Other Mother     Social History   Tobacco Use  . Smoking status: Former Smoker    Types: Cigarettes  . Smokeless tobacco: Never Used  . Tobacco comment: smoked ~1ppd in his 20's. He is unsure how long he smoked.   Substance Use Topics  . Alcohol use: Never  . Drug use: Not on file    Home Medications Prior to Admission medications   Medication Sig Start Date End Date Taking? Authorizing Provider  dorzolamide-timolol (COSOPT) 22.3-6.8 MG/ML ophthalmic solution Place 1 drop into the left eye 2 (two) times daily.    [provider]  levothyroxine (SYNTHROID) 100 MCG tablet Take 100 mcg by mouth daily before breakfast.    [provider]  Multiple Vitamin (MULTIVITAMIN WITH MINERALS) TABS tablet Take 1 tablet by mouth daily. 11/04/18   Guilford Shi, MD    Allergies    Adhesive [tape], Fluorescein-benoxinate, and Latex  Review of Systems   Review of Systems  Constitutional: Negative for chills, diaphoresis, fatigue and fever.  HENT: Negative for congestion.   Eyes: Positive for visual disturbance (at baseline).  Respiratory: Negative for cough, chest tightness and shortness of breath.   Cardiovascular: Negative for chest pain.  Gastrointestinal: Negative for abdominal pain, constipation, diarrhea, nausea and vomiting.  Genitourinary: Negative for flank pain and frequency.  Musculoskeletal: Negative for back pain, neck pain and neck stiffness.  Skin: Negative for rash and wound.  Neurological: Negative for dizziness, light-headedness and headaches.  Psychiatric/Behavioral: Negative for agitation.  All other systems reviewed and are negative.   Physical Exam Updated Vital Signs There were no vitals taken for this visit.  Physical Exam Vitals and nursing note reviewed.  Constitutional:      General: He is not in acute distress.    Appearance: He is well-developed. He is not ill-appearing, toxic-appearing or diaphoretic.  HENT:     Head: Normocephalic and atraumatic.     Nose: No congestion or rhinorrhea.     Mouth/Throat:  Mouth: Mucous membranes are moist.     Pharynx: No oropharyngeal exudate or posterior oropharyngeal erythema.  Eyes:     Conjunctiva/sclera: Conjunctivae normal.  Cardiovascular:     Rate and Rhythm: Normal rate and regular rhythm.     Pulses: Normal pulses.     Heart sounds: No murmur heard.   Pulmonary:     Effort: Pulmonary effort is normal. No respiratory distress.     Breath sounds: Normal breath sounds. No wheezing, rhonchi or rales.  Chest:      Chest wall: No tenderness.  Abdominal:     General: Abdomen is flat.     Palpations: Abdomen is soft.     Tenderness: There is no abdominal tenderness. There is no right CVA tenderness, left CVA tenderness, guarding or rebound.  Musculoskeletal:        General: Tenderness, deformity (L leg shortnened) and signs of injury present.     Cervical back: Neck supple. No tenderness.     Left hip: Tenderness and bony tenderness present.     Right lower leg: No edema.     Left lower leg: No edema.       Legs:     Comments: Palpable DP and PT pulse in both legs.  Left leg is shortened compared to right.  Good sensation and strength in the foot.  Tenderness in the left hip.  Skin:    General: Skin is warm and dry.     Capillary Refill: Capillary refill takes less than 2 seconds.     Findings: No erythema or rash.  Neurological:     General: No focal deficit present.     Mental Status: He is alert.  Psychiatric:        Mood and Affect: Mood normal.     ED Results / Procedures / Treatments   Labs (all labs ordered are listed, but only abnormal results are displayed) Labs Reviewed  CBC WITH DIFFERENTIAL/PLATELET - Abnormal; Notable for the following components:      Result Value   RBC 3.66 (*)    Hemoglobin 12.1 (*)    HCT 38.1 (*)    MCV 104.1 (*)    All other components within normal limits  COMPREHENSIVE METABOLIC PANEL - Abnormal; Notable for the following components:   CO2 19 (*)    Glucose, Bld 182 (*)    Creatinine, Ser 1.55 (*)    Total Protein 6.3 (*)    GFR, Estimated 44 (*)    All other components within normal limits  CBG MONITORING, ED - Abnormal; Notable for the following components:   Glucose-Capillary 190 (*)    All other components within normal limits  RESPIRATORY PANEL BY RT PCR (FLU A&B, COVID)  PROTIME-INR  CBC  BASIC METABOLIC PANEL  HEMOGLOBIN A1C  TYPE AND SCREEN    EKG EKG Interpretation  Date/Time:  Thursday February 14 2020 16:32:26  EDT Ventricular Rate:  87 PR Interval:    QRS Duration: 135 QT Interval:  448 QTC Calculation: 539 R Axis:   81 Text Interpretation: Sinus rhythm Prolonged PR interval Consider left atrial enlargement IVCD, consider atypical LBBB When compared to prior, similar apperaence. No STEMI Confirmed by Antony Blackbird [phone]) on 02/14/2020 4:38:30 PM   Radiology DG Chest 1 View  Result Date: 02/14/2020 CLINICAL DATA:  Recent fall with chest pain, possible hip fracture, initial encounter EXAM: CHEST  1 VIEW COMPARISON:  12/17/2007 FINDINGS: Cardiac shadow is stable. Aortic calcifications are noted. The lungs are well aerated  bilaterally. No focal infiltrate or sizable effusion is seen. No bony abnormality is noted. IMPRESSION: No active disease. Electronically Signed   By: Inez Catalina M.D.   On: 02/14/2020 17:37   DG Hip Unilat W or Wo Pelvis 2-3 Views Left  Result Date: 02/14/2020 CLINICAL DATA:  Fall with shortening of the left leg EXAM: DG HIP (WITH OR WITHOUT PELVIS) 2-3V LEFT COMPARISON:  None. FINDINGS: SI joints are non widened. Zipper artifact on the images. Right femoral head projects in joint. Pubic symphysis appears intact. Probable chronic fracture deformity at the left inferior pubic ramus. Acute mildly displaced left femoral neck fracture with mild cranial migration of the trochanter. No left femoral head dislocation. Extensive vascular calcification IMPRESSION: Acute mildly displaced left femoral neck fracture. Probable chronic fracture deformity of the left inferior pubic ramus. Electronically Signed   By: Donavan Foil M.D.   On: 02/14/2020 17:34    Procedures Procedures (including critical care time)  Medications Ordered in ED Medications  levothyroxine (SYNTHROID) tablet 88 mcg (has no administration in time range)  dorzolamide-timolol (COSOPT) 22.3-6.8 MG/ML ophthalmic solution 1 drop (has no administration in time range)  multivitamin with minerals tablet 1 tablet (0 tablets  Oral Hold 02/14/20 2044)  HYDROcodone-acetaminophen (NORCO/VICODIN) 5-325 MG per tablet 1-2 tablet (has no administration in time range)  morphine 2 MG/ML injection 0.5 mg (0.5 mg Intravenous Given 02/14/20 2216)  polyethylene glycol (MIRALAX / GLYCOLAX) packet 17 g (has no administration in time range)  insulin aspart (novoLOG) injection 0-9 Units (has no administration in time range)  prochlorperazine (COMPAZINE) injection 10 mg (10 mg Intravenous Given 02/14/20 2101)    ED Course  I have reviewed the triage vital signs and the nursing notes.  Pertinent labs & imaging results that were available during my care of the patient were reviewed by me and considered in my medical decision making (see chart for details).    MDM Rules/Calculators/A&P                          Douglas Melton is a 84 y.o. male with a past medical history significant for hypertension, CKD, diabetes, significant blindness, and hypothyroidism who presents for a fall.  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  He reports that he was caring several drawers from a dresser today when he tripped and hit a table falling down onto the ground with his left hip.  He reports immediate onset of pain and he has been unable to ambulate.  He called for help and was brought in by EMS.  He denies any headache, neck pain, chest pain, abdominal pain, or back pain.  He denies any prior fractures.  He denies any numbness or tingling in the left leg but he does note that it is shorter than the right side.  He denies any recent preceding symptoms including no recent fevers, chills, chest pain, shortness breath, cough, URI symptoms.  He denies any Covid exposures or symptoms.  He was given 150 mcg of fentanyl in route and his pain is now a 2 out of 10.  He denies other complaints.  On exam, patient does have a shortened left leg compared to the right.  He has palpable DP and PT pulse.   He has normal sensation and strength of the foot.  He has tenderness in the left hip.  No abdominal tenderness or chest tenderness.  No  focal neurologic deficit on muscle exam aside from the blindness reported.  EKG shows no STEMI.  Patient will have imaging including x-ray of the left hip and chest x-ray for possible preoperative clearance.  Clinically I suspect a hip fracture.  We will get labs and Covid test as well.  He will be n.p.o.  He will be given pain medicine if needed.  Due to his lack of any headache or neck pain, low suspicion for neck or head injury.  Will remove collar shortly.  Anticipate admission for hip fracture if imaging confirms.  6:16 PM Fracture was discovered on imaging.  Will call orthopedics and anticipate admission to medicine service for further management.  6:47 PM Joe spoke with Dr. Griffin Basil with orthopedics, he requests patient be n.p.o. midnight and admitted to medicine for medical clearance for other comorbidities.  Patient will be allowed to eat and drink until then.  He agreed with hospitalist admission.    Final Clinical Impression(s) / ED Diagnoses Final diagnoses:  Fall, initial encounter  Closed fracture of left hip, initial encounter ([place])   Clinical Impression: 1. Fall, initial encounter   2. Suspected fracture of left hip   3. Closed fracture of left hip, initial encounter [facility])     Disposition: Admit  This note was prepared with assistance of Dragon voice recognition software. Occasional wrong-word or sound-a-like substitutions may have occurred due to the inherent limitations of voice recognition software.     Carmita Boom, Gwenyth Allegra, MD 02/14/20 2325

## 2020-02-15 ENCOUNTER — Inpatient Hospital Stay (HOSPITAL_COMMUNITY): Payer: Medicare Other

## 2020-02-15 ENCOUNTER — Encounter (HOSPITAL_COMMUNITY)
Admission: EM | Disposition: A | Discharge status: Skilled Nursing Facility | Payer: Self-pay | Source: Home / Self Care | Attending: Family Medicine

## 2020-02-15 ENCOUNTER — Inpatient Hospital Stay (HOSPITAL_COMMUNITY): Payer: Medicare Other | Admitting: Anesthesiology

## 2020-02-15 ENCOUNTER — Encounter (HOSPITAL_COMMUNITY): Payer: Self-pay | Admitting: Internal Medicine

## 2020-02-15 HISTORY — PX: TOTAL HIP ARTHROPLASTY: SHX124

## 2020-02-15 LAB — BASIC METABOLIC PANEL
Anion gap: 8 (ref 5–15)
BUN: 16 mg/dL (ref 8–23)
CO2: 23 mmol/L (ref 22–32)
Calcium: 9 mg/dL (ref 8.9–10.3)
Chloride: 108 mmol/L (ref 98–111)
Creatinine, Ser: 1.38 mg/dL — ABNORMAL HIGH (ref 0.61–1.24)
GFR, Estimated: 51 mL/min — ABNORMAL LOW (ref >60)
Glucose, Bld: 161 mg/dL — ABNORMAL HIGH (ref 70–99)
Potassium: 4.2 mmol/L (ref 3.5–5.1)
Sodium: 139 mmol/L (ref 135–145)

## 2020-02-15 LAB — CBG MONITORING, ED
Glucose-Capillary: 136 mg/dL — ABNORMAL HIGH (ref 70–99)
Glucose-Capillary: 128 mg/dL — ABNORMAL HIGH (ref 70–99)
Glucose-Capillary: 124 mg/dL — ABNORMAL HIGH (ref 70–99)

## 2020-02-15 LAB — CBC
HCT: 36.1 % — ABNORMAL LOW (ref 39.0–52.0)
Hemoglobin: 11.5 g/dL — ABNORMAL LOW (ref 13.0–17.0)
MCH: 32.3 pg (ref 26.0–34.0)
MCHC: 31.9 g/dL (ref 30.0–36.0)
MCV: 101.4 fL — ABNORMAL HIGH (ref 80.0–100.0)
Platelets: 264 10*3/uL (ref 150–400)
RBC: 3.56 MIL/uL — ABNORMAL LOW (ref 4.22–5.81)
RDW: 12.6 % (ref 11.5–15.5)
WBC: 12.9 10*3/uL — ABNORMAL HIGH (ref 4.0–10.5)
nRBC: 0 % (ref 0.0–0.2)

## 2020-02-15 LAB — GLUCOSE, CAPILLARY: Glucose-Capillary: 146 mg/dL — ABNORMAL HIGH (ref 70–99)

## 2020-02-15 SURGERY — ARTHROPLASTY, HIP, TOTAL, ANTERIOR APPROACH
Anesthesia: General | Site: Hip | Laterality: Left

## 2020-02-15 MED ORDER — POVIDONE-IODINE 10 % EX SWAB: 2.0000 "application " | Freq: Once | CUTANEOUS | Status: DC

## 2020-02-15 MED ORDER — ROCURONIUM BROMIDE 10 MG/ML (PF) SYRINGE
PREFILLED_SYRINGE | INTRAVENOUS | Status: DC | PRN
  Administered 2020-02-15: 70 mg via INTRAVENOUS

## 2020-02-15 MED ORDER — ACETAMINOPHEN 10 MG/ML IV SOLN
INTRAVENOUS | Status: AC
  Filled 2020-02-15: qty 100

## 2020-02-15 MED ORDER — FENTANYL CITRATE (PF) 100 MCG/2ML IJ SOLN: 50.0000 ug | Freq: Once | INTRAMUSCULAR | Status: AC

## 2020-02-15 MED ORDER — BUPIVACAINE-EPINEPHRINE 0.5% -1:200000 IJ SOLN
INTRAMUSCULAR | Status: AC
  Filled 2020-02-15: qty 1

## 2020-02-15 MED ORDER — FENTANYL CITRATE (PF) 100 MCG/2ML IJ SOLN
INTRAMUSCULAR | Status: AC
  Filled 2020-02-15: qty 2

## 2020-02-15 MED ORDER — EPHEDRINE SULFATE-NACL 50-0.9 MG/10ML-% IV SOSY
PREFILLED_SYRINGE | INTRAVENOUS | Status: DC | PRN
  Administered 2020-02-15: 7.5 mg via INTRAVENOUS

## 2020-02-15 MED ORDER — KETOROLAC TROMETHAMINE 30 MG/ML IJ SOLN
INTRAMUSCULAR | Status: DC | PRN
  Administered 2020-02-15: 30 mg via INTRA_ARTICULAR

## 2020-02-15 MED ORDER — HYDROCODONE-ACETAMINOPHEN 5-325 MG PO TABS
1.0000 | ORAL_TABLET | Freq: Three times a day (TID) | ORAL | Status: DC | PRN
  Administered 2020-02-15: 1 via ORAL
  Filled 2020-02-15: qty 1

## 2020-02-15 MED ORDER — HYDROMORPHONE HCL 1 MG/ML IJ SOLN
INTRAMUSCULAR | Status: AC
  Administered 2020-02-15: 0.25 mg via INTRAVENOUS
  Filled 2020-02-15: qty 1

## 2020-02-15 MED ORDER — PHENYLEPHRINE 40 MCG/ML (10ML) SYRINGE FOR IV PUSH (FOR BLOOD PRESSURE SUPPORT)
PREFILLED_SYRINGE | INTRAVENOUS | Status: DC | PRN
  Administered 2020-02-15: 80 ug via INTRAVENOUS

## 2020-02-15 MED ORDER — PHENOL 1.4 % MT LIQD: 1.0000 | OROMUCOSAL | Status: DC | PRN

## 2020-02-15 MED ORDER — CEFAZOLIN SODIUM-DEXTROSE 2-4 GM/100ML-% IV SOLN
2.0000 g | Freq: Four times a day (QID) | INTRAVENOUS | Status: AC
  Administered 2020-02-15 – 2020-02-16 (×2): 2 g via INTRAVENOUS
  Filled 2020-02-15 (×2): qty 100

## 2020-02-15 MED ORDER — ONDANSETRON HCL 4 MG/2ML IJ SOLN
4.0000 mg | Freq: Once | INTRAMUSCULAR | Status: AC | PRN
  Administered 2020-02-15: 4 mg via INTRAVENOUS

## 2020-02-15 MED ORDER — SUGAMMADEX SODIUM 200 MG/2ML IV SOLN
INTRAVENOUS | Status: DC | PRN
  Administered 2020-02-15: 180 mg via INTRAVENOUS

## 2020-02-15 MED ORDER — CHLORHEXIDINE GLUCONATE 0.12 % MT SOLN
OROMUCOSAL | Status: AC
  Administered 2020-02-15: 15 mL via OROMUCOSAL
  Filled 2020-02-15: qty 15

## 2020-02-15 MED ORDER — SODIUM CHLORIDE 0.9 % IR SOLN
Status: DC | PRN
  Administered 2020-02-15: 3000 mL

## 2020-02-15 MED ORDER — FENTANYL CITRATE (PF) 100 MCG/2ML IJ SOLN
INTRAMUSCULAR | Status: DC | PRN
  Administered 2020-02-15: 100 ug via INTRAVENOUS
  Administered 2020-02-15 (×2): 50 ug via INTRAVENOUS

## 2020-02-15 MED ORDER — MEPERIDINE HCL 25 MG/ML IJ SOLN: 6.2500 mg | INTRAMUSCULAR | Status: DC | PRN

## 2020-02-15 MED ORDER — CHLORHEXIDINE GLUCONATE 0.12 % MT SOLN
15.0000 mL | OROMUCOSAL | Status: AC
  Filled 2020-02-15: qty 15

## 2020-02-15 MED ORDER — ACETAMINOPHEN 500 MG PO TABS: 1000.0000 mg | ORAL_TABLET | Freq: Once | ORAL | Status: DC

## 2020-02-15 MED ORDER — METHOCARBAMOL 500 MG PO TABS: 500.0000 mg | ORAL_TABLET | Freq: Four times a day (QID) | ORAL | Status: DC | PRN

## 2020-02-15 MED ORDER — MORPHINE SULFATE (PF) 2 MG/ML IV SOLN
0.5000 mg | INTRAVENOUS | Status: DC | PRN
  Administered 2020-02-15 (×2): 0.5 mg via INTRAVENOUS
  Filled 2020-02-15 (×2): qty 1

## 2020-02-15 MED ORDER — BUPIVACAINE-EPINEPHRINE (PF) 0.5% -1:200000 IJ SOLN
INTRAMUSCULAR | Status: DC | PRN
  Administered 2020-02-15: 30 mL

## 2020-02-15 MED ORDER — LIDOCAINE 2% (20 MG/ML) 5 ML SYRINGE
INTRAMUSCULAR | Status: DC | PRN
  Administered 2020-02-15: 60 mg via INTRAVENOUS

## 2020-02-15 MED ORDER — LIDOCAINE 2% (20 MG/ML) 5 ML SYRINGE
INTRAMUSCULAR | Status: AC
  Filled 2020-02-15: qty 5

## 2020-02-15 MED ORDER — METOCLOPRAMIDE HCL 5 MG PO TABS: 5.0000 mg | ORAL_TABLET | Freq: Three times a day (TID) | ORAL | Status: DC | PRN

## 2020-02-15 MED ORDER — ASPIRIN EC 325 MG PO TBEC
325.0000 mg | DELAYED_RELEASE_TABLET | Freq: Every day | ORAL | Status: DC
  Administered 2020-02-16 – 2020-02-20 (×5): 325 mg via ORAL
  Filled 2020-02-15 (×5): qty 1

## 2020-02-15 MED ORDER — SODIUM CHLORIDE 0.9 % IV SOLN
INTRAVENOUS | Status: AC | PRN
  Administered 2020-02-15: 1000 mL

## 2020-02-15 MED ORDER — MENTHOL 3 MG MT LOZG: 1.0000 | LOZENGE | OROMUCOSAL | Status: DC | PRN

## 2020-02-15 MED ORDER — PROPOFOL 500 MG/50ML IV EMUL
INTRAVENOUS | Status: DC | PRN
  Administered 2020-02-15: 30 ug/kg/min via INTRAVENOUS

## 2020-02-15 MED ORDER — ALBUMIN HUMAN 5 % IV SOLN: INTRAVENOUS | Status: DC | PRN

## 2020-02-15 MED ORDER — DOCUSATE SODIUM 100 MG PO CAPS
100.0000 mg | ORAL_CAPSULE | Freq: Two times a day (BID) | ORAL | Status: DC
  Administered 2020-02-15 – 2020-02-20 (×8): 100 mg via ORAL
  Filled 2020-02-15 (×10): qty 1

## 2020-02-15 MED ORDER — CEFAZOLIN SODIUM-DEXTROSE 2-4 GM/100ML-% IV SOLN
2.0000 g | INTRAVENOUS | Status: AC
  Administered 2020-02-15: 2 g via INTRAVENOUS

## 2020-02-15 MED ORDER — CHLORHEXIDINE GLUCONATE 4 % EX LIQD: 60.0000 mL | Freq: Once | CUTANEOUS | Status: DC

## 2020-02-15 MED ORDER — FENTANYL CITRATE (PF) 250 MCG/5ML IJ SOLN
INTRAMUSCULAR | Status: AC
  Filled 2020-02-15: qty 5

## 2020-02-15 MED ORDER — LACTATED RINGERS IV SOLN: INTRAVENOUS | Status: DC

## 2020-02-15 MED ORDER — CEFAZOLIN SODIUM-DEXTROSE 2-4 GM/100ML-% IV SOLN
INTRAVENOUS | Status: AC
  Filled 2020-02-15: qty 100

## 2020-02-15 MED ORDER — LISINOPRIL 5 MG PO TABS
5.0000 mg | ORAL_TABLET | Freq: Every day | ORAL | Status: DC
  Administered 2020-02-15 – 2020-02-20 (×6): 5 mg via ORAL
  Filled 2020-02-15 (×6): qty 1

## 2020-02-15 MED ORDER — PROPOFOL 10 MG/ML IV BOLUS
INTRAVENOUS | Status: DC | PRN
  Administered 2020-02-15: 120 mg via INTRAVENOUS

## 2020-02-15 MED ORDER — HYDROMORPHONE HCL 1 MG/ML IJ SOLN
0.2500 mg | INTRAMUSCULAR | Status: DC | PRN
  Administered 2020-02-15: 0.25 mg via INTRAVENOUS

## 2020-02-15 MED ORDER — ONDANSETRON HCL 4 MG/2ML IJ SOLN
INTRAMUSCULAR | Status: AC
  Filled 2020-02-15: qty 2

## 2020-02-15 MED ORDER — METHOCARBAMOL 1000 MG/10ML IJ SOLN
500.0000 mg | Freq: Four times a day (QID) | INTRAVENOUS | Status: DC | PRN
  Filled 2020-02-15: qty 5

## 2020-02-15 MED ORDER — SODIUM CHLORIDE (PF) 0.9 % IJ SOLN
INTRAMUSCULAR | Status: DC | PRN
  Administered 2020-02-15: 30 mL

## 2020-02-15 MED ORDER — ONDANSETRON HCL 4 MG PO TABS: 4.0000 mg | ORAL_TABLET | Freq: Four times a day (QID) | ORAL | Status: DC | PRN

## 2020-02-15 MED ORDER — ONDANSETRON HCL 4 MG/2ML IJ SOLN: 4.0000 mg | Freq: Four times a day (QID) | INTRAMUSCULAR | Status: DC | PRN

## 2020-02-15 MED ORDER — METOCLOPRAMIDE HCL 5 MG/ML IJ SOLN: 5.0000 mg | Freq: Three times a day (TID) | INTRAMUSCULAR | Status: DC | PRN

## 2020-02-15 MED ORDER — LACTATED RINGERS IV SOLN: INTRAVENOUS | Status: DC | PRN

## 2020-02-15 MED ORDER — ROCURONIUM BROMIDE 10 MG/ML (PF) SYRINGE
PREFILLED_SYRINGE | INTRAVENOUS | Status: AC
  Filled 2020-02-15: qty 10

## 2020-02-15 MED ORDER — KETOROLAC TROMETHAMINE 30 MG/ML IJ SOLN
INTRAMUSCULAR | Status: AC
  Filled 2020-02-15: qty 1

## 2020-02-15 MED ORDER — PHENYLEPHRINE HCL-NACL 10-0.9 MG/250ML-% IV SOLN
INTRAVENOUS | Status: DC | PRN
  Administered 2020-02-15: 30 ug/min via INTRAVENOUS

## 2020-02-15 MED ORDER — 0.9 % SODIUM CHLORIDE (POUR BTL) OPTIME
TOPICAL | Status: DC | PRN
  Administered 2020-02-15: 1000 mL

## 2020-02-15 MED ORDER — FENTANYL CITRATE (PF) 100 MCG/2ML IJ SOLN
INTRAMUSCULAR | Status: AC
  Administered 2020-02-15: 50 ug via INTRAVENOUS
  Filled 2020-02-15: qty 2

## 2020-02-15 MED ORDER — ACETAMINOPHEN 10 MG/ML IV SOLN
INTRAVENOUS | Status: DC | PRN
  Administered 2020-02-15: 1000 mg via INTRAVENOUS

## 2020-02-15 SURGICAL SUPPLY — 71 items
ACETAB CUP W/GRIPTION 54 (Plate) ×2 IMPLANT
ADH SKN CLS APL DERMABOND .7 (GAUZE/BANDAGES/DRESSINGS) ×1
ALCOHOL 70% 16 OZ (MISCELLANEOUS) ×2 IMPLANT
APL PRP STRL LF DISP 70% ISPRP (MISCELLANEOUS) ×1
BALL HIP ARTICU EZE 36 8.5 (Hips) IMPLANT
BLADE CLIPPER SURG (BLADE) ×1 IMPLANT
CHLORAPREP W/TINT 26 (MISCELLANEOUS) ×2 IMPLANT
COVER SURGICAL LIGHT HANDLE (MISCELLANEOUS) ×2 IMPLANT
COVER WAND RF STERILE (DRAPES) ×1 IMPLANT
CUP ACETAB W/GRIPTION 54 (Plate) IMPLANT
DERMABOND ADVANCED (GAUZE/BANDAGES/DRESSINGS) ×1
DERMABOND ADVANCED .7 DNX12 (GAUZE/BANDAGES/DRESSINGS) ×2 IMPLANT
DRAPE C-ARM 42X72 X-RAY (DRAPES) ×2 IMPLANT
DRAPE STERI IOBAN 125X83 (DRAPES) ×2 IMPLANT
DRAPE U-SHAPE 47X51 STRL (DRAPES) ×6 IMPLANT
DRSG AQUACEL AG ADV 3.5X10 (GAUZE/BANDAGES/DRESSINGS) ×2 IMPLANT
DRSG MEPILEX BORDER 4X8 (GAUZE/BANDAGES/DRESSINGS) IMPLANT
ELECT BLADE 4.0 EZ CLEAN MEGAD (MISCELLANEOUS) ×2
ELECT PENCIL ROCKER SW 15FT (MISCELLANEOUS) ×2 IMPLANT
ELECT REM PT RETURN 9FT ADLT (ELECTROSURGICAL) ×2
ELECTRODE BLDE 4.0 EZ CLN MEGD (MISCELLANEOUS) ×1 IMPLANT
ELECTRODE REM PT RTRN 9FT ADLT (ELECTROSURGICAL) ×1 IMPLANT
EVACUATOR 1/8 PVC DRAIN (DRAIN) IMPLANT
GLOVE BIO SURGEON STRL SZ8.5 (GLOVE) ×4 IMPLANT
GLOVE BIOGEL M 7.0 STRL (GLOVE) ×2 IMPLANT
GLOVE BIOGEL PI IND STRL 7.5 (GLOVE) ×1 IMPLANT
GLOVE BIOGEL PI IND STRL 8.5 (GLOVE) ×1 IMPLANT
GLOVE BIOGEL PI INDICATOR 7.5 (GLOVE) ×1
GLOVE BIOGEL PI INDICATOR 8.5 (GLOVE) ×1
GOWN STRL REUS W/ TWL LRG LVL3 (GOWN DISPOSABLE) ×2 IMPLANT
GOWN STRL REUS W/ TWL XL LVL3 (GOWN DISPOSABLE) ×1 IMPLANT
GOWN STRL REUS W/TWL 2XL LVL3 (GOWN DISPOSABLE) ×2 IMPLANT
GOWN STRL REUS W/TWL LRG LVL3 (GOWN DISPOSABLE) ×4
GOWN STRL REUS W/TWL XL LVL3 (GOWN DISPOSABLE) ×2
HANDPIECE INTERPULSE COAX TIP (DISPOSABLE) ×2
HIP BALL ARTICU EZE 36 8.5 (Hips) ×2 IMPLANT
HOOD PEEL AWAY FACE SHEILD DIS (HOOD) ×5 IMPLANT
JET LAVAGE IRRISEPT WOUND (IRRIGATION / IRRIGATOR) ×2
KIT BASIN OR (CUSTOM PROCEDURE TRAY) ×2 IMPLANT
KIT TURNOVER KIT B (KITS) ×2 IMPLANT
LAVAGE JET IRRISEPT WOUND (IRRIGATION / IRRIGATOR) ×1 IMPLANT
LINER NEUTRAL 54X36MM PLUS 4 (Hips) ×1 IMPLANT
MANIFOLD NEPTUNE II (INSTRUMENTS) ×2 IMPLANT
MARKER SKIN DUAL TIP RULER LAB (MISCELLANEOUS) ×4 IMPLANT
NDL SPNL 18GX3.5 QUINCKE PK (NEEDLE) ×1 IMPLANT
NEEDLE SPNL 18GX3.5 QUINCKE PK (NEEDLE) ×2 IMPLANT
NS IRRIG 1000ML POUR BTL (IV SOLUTION) ×2 IMPLANT
PACK TOTAL JOINT (CUSTOM PROCEDURE TRAY) ×2 IMPLANT
PACK UNIVERSAL I (CUSTOM PROCEDURE TRAY) ×2 IMPLANT
PAD ARMBOARD 7.5X6 YLW CONV (MISCELLANEOUS) ×5 IMPLANT
SAW OSC TIP CART 19.5X105X1.3 (SAW) ×2 IMPLANT
SEALER BIPOLAR AQUA 6.0 (INSTRUMENTS) ×1 IMPLANT
SET HNDPC FAN SPRY TIP SCT (DISPOSABLE) ×1 IMPLANT
SOL PREP POV-IOD 4OZ 10% (MISCELLANEOUS) ×2 IMPLANT
STEM TRI LOC BPS GRIP SZ11 (Hips) IMPLANT
SUT ETHIBOND NAB CT1 #1 30IN (SUTURE) ×4 IMPLANT
SUT MNCRL AB 3-0 PS2 18 (SUTURE) ×2 IMPLANT
SUT MON AB 2-0 CT1 36 (SUTURE) ×2 IMPLANT
SUT VIC AB 1 CT1 27 (SUTURE) ×2
SUT VIC AB 1 CT1 27XBRD ANBCTR (SUTURE) ×1 IMPLANT
SUT VIC AB 2-0 CT1 27 (SUTURE) ×2
SUT VIC AB 2-0 CT1 TAPERPNT 27 (SUTURE) ×1 IMPLANT
SUT VLOC 180 0 24IN GS25 (SUTURE) ×2 IMPLANT
SYR 50ML LL SCALE MARK (SYRINGE) ×2 IMPLANT
TOWEL GREEN STERILE (TOWEL DISPOSABLE) ×2 IMPLANT
TOWEL GREEN STERILE FF (TOWEL DISPOSABLE) ×2 IMPLANT
TRAY CATH 16FR W/PLASTIC CATH (SET/KITS/TRAYS/PACK) IMPLANT
TRAY FOLEY W/BAG SLVR 16FR (SET/KITS/TRAYS/PACK) ×2
TRAY FOLEY W/BAG SLVR 16FR ST (SET/KITS/TRAYS/PACK) IMPLANT
TRI LOC BPS W/GRIP SZ11 (Hips) ×2 IMPLANT
WATER STERILE IRR 1000ML POUR (IV SOLUTION) ×3 IMPLANT

## 2020-02-15 NOTE — Anesthesia Preprocedure Evaluation (Addendum)
Anesthesia Evaluation  Patient identified by MRN, date of birth, ID band Patient awake    Airway Mallampati: II  TM Distance: >3 FB Neck ROM: Full    Dental  (+) Teeth Intact   Pulmonary former smoker,    Pulmonary exam normal        Cardiovascular hypertension, Pt. on medications  Rhythm:Regular Rate:Normal     Neuro/Psych    GI/Hepatic   Endo/Other  diabetes, Type 2  Renal/GU Renal InsufficiencyRenal disease     Musculoskeletal   Abdominal (+)  Abdomen: soft. Bowel sounds: normal.  Peds  Hematology   Anesthesia Other Findings   Reproductive/Obstetrics                            Anesthesia Physical Anesthesia Plan  ASA: III  Anesthesia Plan: General   Post-op Pain Management:    Induction: Intravenous  PONV Risk Score and Plan: 2 and Ondansetron and Treatment may vary due to age or medical condition  Airway Management Planned: Oral ETT  Additional Equipment:   Intra-op Plan:   Post-operative Plan: Extubation in OR  Informed Consent: I have reviewed the patients History and Physical, chart, labs and discussed the procedure including the risks, benefits and alternatives for the proposed anesthesia with the patient or authorized representative who has indicated his/her understanding and acceptance.       Plan Discussed with: CRNA and Surgeon  Anesthesia Plan Comments:         Anesthesia Quick Evaluation

## 2020-02-15 NOTE — OR Nursing (Signed)
Pt is awake,alert and oriented.Pt and/or family verbalized understanding of poc and discharge instructions. Reviewed admission and on going care with receiving RN. Pt is in NAD at this time and is ready to be transferred to floor. Will con't to monitor until pt is transferred. Belongings with family member whom is at the bedside. Report given to [facility]

## 2020-02-15 NOTE — Transfer of Care (Signed)
Immediate Anesthesia Transfer of Care Note  Patient: Douglas Melton  Procedure(s) Performed: TOTAL HIP ARTHROPLASTY  ANTERIOR APPROACH (Left Hip)  Patient Location: PACU  Anesthesia Type:General  Level of Consciousness: awake, alert  and patient cooperative  Airway & Oxygen Therapy: Patient Spontanous Breathing and Patient connected to face mask oxygen  Post-op Assessment: Report given to RN and Post -op Vital signs reviewed and stable  Post vital signs: Reviewed and stable  Last Vitals:  Vitals Value Taken Time  BP 139/61 02/15/20 1751  Temp    Pulse 88 02/15/20 1757  Resp 12 02/15/20 1750  SpO2 100 % 02/15/20 1757  Vitals shown include unvalidated device data.  Last Pain:  Vitals:   02/15/20 0911  TempSrc:   PainSc: 0-No pain         Complications: No complications documented.

## 2020-02-15 NOTE — ED Notes (Signed)
Report to Short stay 33.

## 2020-02-15 NOTE — H&P (View-Only) (Signed)
Reason for Consult:[name] Referring Physician: P Cordarryl Monrreal is an 84 y.o. male.  HPI: Douglas Melton was at home trying to take a drawer out of a chest. He caught his toe on a table and fell onto his left side. He had immediate hip pain and could not get up. He was brought to the ED where x-rays showed a left hip fx and orthopedic surgery was consulted. He lives with his wife and does use any assistive devices to ambulate.  Past Medical History:  Diagnosis Date  . Chronic kidney disease   . Diabetes mellitus without complication ([place])   . Glaucoma   . Hyperlipidemia   . Hypertension     Past Surgical History:  Procedure Laterality Date  . CHOLECYSTECTOMY      Family History  Problem Relation Age of Onset  . Heart disease Father   . Hypertension Father   . Other Mother     Social History:  reports that he has quit smoking. His smoking use included cigarettes. He has never used smokeless tobacco. He reports that he does not drink alcohol and does not use drugs.  Allergies:  Allergies  Allergen Reactions  . Adhesive [Tape] Rash  . Fluorescein-Benoxinate Rash    FA dye (Fluress- eyes)    . Latex Rash    Medications: I have reviewed the patient's current medications.  Results for orders placed or performed during the hospital encounter of 02/14/20 (from the past 48 hour(s))  CBC with Differential     Status: Abnormal   Collection Time: 02/14/20  4:31 PM  Result Value Ref Range   WBC 7.5 4.0 - 10.5 K/uL   RBC 3.66 (L) 4.22 - 5.81 MIL/uL   Hemoglobin 12.1 (L) 13.0 - 17.0 g/dL   HCT 38.1 (L) 39 - 52 %   MCV 104.1 (H) 80.0 - 100.0 fL   MCH 33.1 26.0 - 34.0 pg   MCHC 31.8 30.0 - 36.0 g/dL   RDW 12.7 11.5 - 15.5 %   Platelets 287 150 - 400 K/uL   nRBC 0.0 0.0 - 0.2 %   Neutrophils Relative % 61 %   Neutro Abs 4.6 1.7 - 7.7 K/uL   Lymphocytes Relative 26 %   Lymphs Abs 2.0 0.7 - 4.0 K/uL   Monocytes Relative 8 %   Monocytes Absolute 0.6 0.1 - 1.0 K/uL    Eosinophils Relative 3 %   Eosinophils Absolute 0.2 0.0 - 0.5 K/uL   Basophils Relative 2 %   Basophils Absolute 0.1 0.0 - 0.1 K/uL   Immature Granulocytes 0 %   Abs Immature Granulocytes 0.03 0.00 - 0.07 K/uL    Comment: Performed at [facility], [address], [place], [place] [address]  Comprehensive metabolic panel     Status: Abnormal   Collection Time: 02/14/20  4:31 PM  Result Value Ref Range   Sodium 139 135 - 145 mmol/L   Potassium 4.6 3.5 - 5.1 mmol/L   Chloride 110 98 - 111 mmol/L   CO2 19 (L) 22 - 32 mmol/L   Glucose, Bld 182 (H) 70 - 99 mg/dL    Comment: Glucose reference range applies only to samples taken after fasting for at least 8 hours.   BUN 18 8 - 23 mg/dL   Creatinine, Ser 1.55 (H) 0.61 - 1.24 mg/dL   Calcium 9.0 8.9 - 10.3 mg/dL   Total Protein 6.3 (L) 6.5 - 8.1 g/dL   Albumin 3.5 3.5 - 5.0  g/dL   AST 15 15 - 41 U/L   ALT 12 0 - 44 U/L   Alkaline Phosphatase 89 38 - 126 U/L   Total Bilirubin 0.6 0.3 - 1.2 mg/dL   GFR, Estimated 44 (L) >60 mL/min    Comment: (NOTE) Calculated using the CKD-EPI Creatinine Equation (2021)    Anion gap 10 5 - 15    Comment: Performed at [facility] [address] [place], [place] [address]  Protime-INR     Status: None   Collection Time: 02/14/20  4:31 PM  Result Value Ref Range   Prothrombin Time 14.1 11.4 - 15.2 seconds   INR 1.1 0.8 - 1.2    Comment: (NOTE) INR goal varies based on device and disease states. Performed at [facility], [place] [address], [place], [place] [address]   Respiratory Panel by RT PCR (Flu A&B, Covid) - Nasopharyngeal Swab     Status: None   Collection Time: 02/14/20  4:32 PM   Specimen: Nasopharyngeal Swab  Result Value Ref Range   SARS Coronavirus 2 by RT PCR NEGATIVE NEGATIVE    Comment: (NOTE) SARS-CoV-2 target nucleic acids are NOT DETECTED.  The SARS-CoV-2 RNA is generally detectable in upper respiratoy specimens during the acute phase of infection. The  lowest concentration of SARS-CoV-2 viral copies this assay can detect is 131 copies/mL. A negative result does not preclude SARS-Cov-2 infection and should not be used as the sole basis for treatment or other patient management decisions. A negative result may occur with  improper specimen collection/handling, submission of specimen other than nasopharyngeal swab, presence of viral mutation(s) within the areas targeted by this assay, and inadequate number of viral copies (<131 copies/mL). A negative result must be combined with clinical observations, patient history, and epidemiological information. The expected result is Negative.  Fact Sheet for Patients:  PinkCheek.be  Fact Sheet for Healthcare Providers:  GravelBags.it  This test is no t yet approved or cleared by the [place] FDA and  has been authorized for detection and/or diagnosis of SARS-CoV-2 by FDA under an Emergency Use Authorization (EUA). This EUA will remain  in effect (meaning this test can be used) for the duration of the COVID-19 declaration under Section 564(b)(1) of the Act, 21 U.S.C. section 360bbb-3(b)(1), unless the authorization is terminated or revoked sooner.     Influenza A by PCR NEGATIVE NEGATIVE   Influenza B by PCR NEGATIVE NEGATIVE    Comment: (NOTE) The Xpert Xpress SARS-CoV-2/FLU/RSV assay is intended as an aid in  the diagnosis of influenza from Nasopharyngeal swab specimens and  should not be used as a sole basis for treatment. Nasal washings and  aspirates are unacceptable for Xpert Xpress SARS-CoV-2/FLU/RSV  testing.  Fact Sheet for Patients: PinkCheek.be  Fact Sheet for Healthcare Providers: GravelBags.it  This test is not yet approved or cleared by the [place] FDA and  has been authorized for detection and/or diagnosis of SARS-CoV-2 by  FDA under an Emergency  Use Authorization (EUA). This EUA will remain  in effect (meaning this test can be used) for the duration of the  Covid-19 declaration under Section 564(b)(1) of the Act, 21  U.S.C. section 360bbb-3(b)(1), unless the authorization is  terminated or revoked. Performed at [facility], [place] [address], [place], [place] [address]   Type and screen [place]     Status: None   Collection Time: 02/14/20  4:40 PM  Result Value Ref Range  ABO/RH(D) O POS    Antibody Screen NEG    Sample Expiration      02/17/2020,2359 Performed at [facility], [place] [address], [place], [place] [address]   CBG monitoring, ED     Status: Abnormal   Collection Time: 02/14/20 11:18 PM  Result Value Ref Range   Glucose-Capillary 190 (H) 70 - 99 mg/dL    Comment: Glucose reference range applies only to samples taken after fasting for at least 8 hours.  CBC     Status: Abnormal   Collection Time: 02/15/20  3:00 AM  Result Value Ref Range   WBC 12.9 (H) 4.0 - 10.5 K/uL   RBC 3.56 (L) 4.22 - 5.81 MIL/uL   Hemoglobin 11.5 (L) 13.0 - 17.0 g/dL   HCT 36.1 (L) 39 - 52 %   MCV 101.4 (H) 80.0 - 100.0 fL   MCH 32.3 26.0 - 34.0 pg   MCHC 31.9 30.0 - 36.0 g/dL   RDW 12.6 11.5 - 15.5 %   Platelets 264 150 - 400 K/uL   nRBC 0.0 0.0 - 0.2 %    Comment: Performed at [facility], [place] [address], [place], [place] [address]  Basic metabolic panel     Status: Abnormal   Collection Time: 02/15/20  3:00 AM  Result Value Ref Range   Sodium 139 135 - 145 mmol/L   Potassium 4.2 3.5 - 5.1 mmol/L   Chloride 108 98 - 111 mmol/L   CO2 23 22 - 32 mmol/L   Glucose, Bld 161 (H) 70 - 99 mg/dL    Comment: Glucose reference range applies only to samples taken after fasting for at least 8 hours.   BUN 16 8 - 23 mg/dL   Creatinine, Ser 1.38 (H) 0.61 - 1.24 mg/dL   Calcium 9.0 8.9 - 10.3 mg/dL   GFR, Estimated 51 (L) >60 mL/min    Comment: (NOTE) Calculated using the CKD-EPI Creatinine Equation  (2021)    Anion gap 8 5 - 15    Comment: Performed at [facility] [address], [place], [place] [address]  CBG monitoring, ED     Status: Abnormal   Collection Time: 02/15/20  8:35 AM  Result Value Ref Range   Glucose-Capillary 136 (H) 70 - 99 mg/dL    Comment: Glucose reference range applies only to samples taken after fasting for at least 8 hours.   Comment 1 Notify RN    Comment 2 Document in Chart     DG Chest 1 View  Result Date: 02/14/2020 CLINICAL DATA:  Recent fall with chest pain, possible hip fracture, initial encounter EXAM: CHEST  1 VIEW COMPARISON:  12/17/2007 FINDINGS: Cardiac shadow is stable. Aortic calcifications are noted. The lungs are well aerated bilaterally. No focal infiltrate or sizable effusion is seen. No bony abnormality is noted. IMPRESSION: No active disease. Electronically Signed   By: Inez Catalina M.D.   On: 02/14/2020 17:37   DG Hip Unilat W or Wo Pelvis 2-3 Views Left  Result Date: 02/14/2020 CLINICAL DATA:  Fall with shortening of the left leg EXAM: DG HIP (WITH OR WITHOUT PELVIS) 2-3V LEFT COMPARISON:  None. FINDINGS: SI joints are non widened. Zipper artifact on the images. Right femoral head projects in joint. Pubic symphysis appears intact. Probable chronic fracture deformity at the left inferior pubic ramus. Acute mildly displaced left femoral neck fracture with mild cranial migration of the trochanter. No left femoral head dislocation. Extensive vascular calcification IMPRESSION:  Acute mildly displaced left femoral neck fracture. Probable chronic fracture deformity of the left inferior pubic ramus. Electronically Signed   By: Donavan Foil M.D.   On: 02/14/2020 17:34    Review of Systems  HENT: Negative for ear discharge, ear pain, hearing loss and tinnitus.   Eyes: Negative for photophobia and pain.  Respiratory: Negative for cough and shortness of breath.   Cardiovascular: Negative for chest pain.  Gastrointestinal: Negative for  abdominal pain, nausea and vomiting.  Genitourinary: Negative for dysuria, flank pain, frequency and urgency.  Musculoskeletal: Positive for arthralgias (Left hip). Negative for back pain, myalgias and neck pain.  Neurological: Negative for dizziness and headaches.  Hematological: Does not bruise/bleed easily.  Psychiatric/Behavioral: The patient is not nervous/anxious.    Blood pressure 126/69, pulse 89, temperature (!) 97.4 F (36.3 C), resp. rate 15, SpO2 98 %. Physical Exam Constitutional:      General: He is not in acute distress.    Appearance: He is well-developed. He is not diaphoretic.  HENT:     Head: Normocephalic and atraumatic.  Eyes:     General: No scleral icterus.       Right eye: No discharge.        Left eye: No discharge.     Conjunctiva/sclera: Conjunctivae normal.  Cardiovascular:     Rate and Rhythm: Normal rate and regular rhythm.  Pulmonary:     Effort: Pulmonary effort is normal. No respiratory distress.  Musculoskeletal:     Cervical back: Normal range of motion.     Comments: LLE No traumatic wounds, ecchymosis, or rash  Mod TTP hip  No knee or ankle effusion  Knee stable to varus/ valgus and anterior/posterior stress  Sens DPN, SPN, TN intact  Motor EHL, ext, flex, evers 5/5  DP 2+, PT 1+, No significant edema  Skin:    General: Skin is warm and dry.  Neurological:     Mental Status: He is alert.  Psychiatric:        Behavior: Behavior normal.     Assessment/Plan: [name] -- Plan THA this afternoon by Dr. Lyla Glassing. Please keep NPO. Multiple medical problems including anemia, diabetes, hypertension, CKD 3, hypothyroidism, and blindness -- per primary service    Lisette Abu, PA-C Orthopedic Surgery [phone] 02/15/2020, 9:46 AM

## 2020-02-15 NOTE — Consult Note (Signed)
Reason for Consult:[name] Referring Physician: P Thierry Melton is an 84 y.o. male.  HPI: Douglas Melton was at home trying to take a drawer out of a chest. He caught his toe on a table and fell onto his left side. He had immediate hip pain and could not get up. He was brought to the ED where x-rays showed a left hip fx and orthopedic surgery was consulted. He lives with his wife and does use any assistive devices to ambulate.  Past Medical History:  Diagnosis Date  . Chronic kidney disease   . Diabetes mellitus without complication ([place])   . Glaucoma   . Hyperlipidemia   . Hypertension     Past Surgical History:  Procedure Laterality Date  . CHOLECYSTECTOMY      Family History  Problem Relation Age of Onset  . Heart disease Father   . Hypertension Father   . Other Mother     Social History:  reports that he has quit smoking. His smoking use included cigarettes. He has never used smokeless tobacco. He reports that he does not drink alcohol and does not use drugs.  Allergies:  Allergies  Allergen Reactions  . Adhesive [Tape] Rash  . Fluorescein-Benoxinate Rash    FA dye (Fluress- eyes)    . Latex Rash    Medications: I have reviewed the patient's current medications.  Results for orders placed or performed during the hospital encounter of 02/14/20 (from the past 48 hour(s))  CBC with Differential     Status: Abnormal   Collection Time: 02/14/20  4:31 PM  Result Value Ref Range   WBC 7.5 4.0 - 10.5 K/uL   RBC 3.66 (L) 4.22 - 5.81 MIL/uL   Hemoglobin 12.1 (L) 13.0 - 17.0 g/dL   HCT 38.1 (L) 39 - 52 %   MCV 104.1 (H) 80.0 - 100.0 fL   MCH 33.1 26.0 - 34.0 pg   MCHC 31.8 30.0 - 36.0 g/dL   RDW 12.7 11.5 - 15.5 %   Platelets 287 150 - 400 K/uL   nRBC 0.0 0.0 - 0.2 %   Neutrophils Relative % 61 %   Neutro Abs 4.6 1.7 - 7.7 K/uL   Lymphocytes Relative 26 %   Lymphs Abs 2.0 0.7 - 4.0 K/uL   Monocytes Relative 8 %   Monocytes Absolute 0.6 0.1 - 1.0 K/uL    Eosinophils Relative 3 %   Eosinophils Absolute 0.2 0.0 - 0.5 K/uL   Basophils Relative 2 %   Basophils Absolute 0.1 0.0 - 0.1 K/uL   Immature Granulocytes 0 %   Abs Immature Granulocytes 0.03 0.00 - 0.07 K/uL    Comment: Performed at [facility], [address] [place], [place] [address]  Comprehensive metabolic panel     Status: Abnormal   Collection Time: 02/14/20  4:31 PM  Result Value Ref Range   Sodium 139 135 - 145 mmol/L   Potassium 4.6 3.5 - 5.1 mmol/L   Chloride 110 98 - 111 mmol/L   CO2 19 (L) 22 - 32 mmol/L   Glucose, Bld 182 (H) 70 - 99 mg/dL    Comment: Glucose reference range applies only to samples taken after fasting for at least 8 hours.   BUN 18 8 - 23 mg/dL   Creatinine, Ser 1.55 (H) 0.61 - 1.24 mg/dL   Calcium 9.0 8.9 - 10.3 mg/dL   Total Protein 6.3 (L) 6.5 - 8.1 g/dL   Albumin 3.5 3.5 - 5.0  g/dL   AST 15 15 - 41 U/L   ALT 12 0 - 44 U/L   Alkaline Phosphatase 89 38 - 126 U/L   Total Bilirubin 0.6 0.3 - 1.2 mg/dL   GFR, Estimated 44 (L) >60 mL/min    Comment: (NOTE) Calculated using the CKD-EPI Creatinine Equation (2021)    Anion gap 10 5 - 15    Comment: Performed at [facility] [address] [place], [place] [address]  Protime-INR     Status: None   Collection Time: 02/14/20  4:31 PM  Result Value Ref Range   Prothrombin Time 14.1 11.4 - 15.2 seconds   INR 1.1 0.8 - 1.2    Comment: (NOTE) INR goal varies based on device and disease states. Performed at [facility], [place] [address], [place], [place] [address]   Respiratory Panel by RT PCR (Flu A&B, Covid) - Nasopharyngeal Swab     Status: None   Collection Time: 02/14/20  4:32 PM   Specimen: Nasopharyngeal Swab  Result Value Ref Range   SARS Coronavirus 2 by RT PCR NEGATIVE NEGATIVE    Comment: (NOTE) SARS-CoV-2 target nucleic acids are NOT DETECTED.  The SARS-CoV-2 RNA is generally detectable in upper respiratoy specimens during the acute phase of infection. The  lowest concentration of SARS-CoV-2 viral copies this assay can detect is 131 copies/mL. A negative result does not preclude SARS-Cov-2 infection and should not be used as the sole basis for treatment or other patient management decisions. A negative result may occur with  improper specimen collection/handling, submission of specimen other than nasopharyngeal swab, presence of viral mutation(s) within the areas targeted by this assay, and inadequate number of viral copies (<131 copies/mL). A negative result must be combined with clinical observations, patient history, and epidemiological information. The expected result is Negative.  Fact Sheet for Patients:  PinkCheek.be  Fact Sheet for Healthcare Providers:  GravelBags.it  This test is no t yet approved or cleared by the [place] FDA and  has been authorized for detection and/or diagnosis of SARS-CoV-2 by FDA under an Emergency Use Authorization (EUA). This EUA will remain  in effect (meaning this test can be used) for the duration of the COVID-19 declaration under Section 564(b)(1) of the Act, 21 U.S.C. section 360bbb-3(b)(1), unless the authorization is terminated or revoked sooner.     Influenza A by PCR NEGATIVE NEGATIVE   Influenza B by PCR NEGATIVE NEGATIVE    Comment: (NOTE) The Xpert Xpress SARS-CoV-2/FLU/RSV assay is intended as an aid in  the diagnosis of influenza from Nasopharyngeal swab specimens and  should not be used as a sole basis for treatment. Nasal washings and  aspirates are unacceptable for Xpert Xpress SARS-CoV-2/FLU/RSV  testing.  Fact Sheet for Patients: PinkCheek.be  Fact Sheet for Healthcare Providers: GravelBags.it  This test is not yet approved or cleared by the [place] FDA and  has been authorized for detection and/or diagnosis of SARS-CoV-2 by  FDA under an Emergency  Use Authorization (EUA). This EUA will remain  in effect (meaning this test can be used) for the duration of the  Covid-19 declaration under Section 564(b)(1) of the Act, 21  U.S.C. section 360bbb-3(b)(1), unless the authorization is  terminated or revoked. Performed at [facility], [place] [address], [place], [place] [address]   Type and screen [place]     Status: None   Collection Time: 02/14/20  4:40 PM  Result Value Ref Range  ABO/RH(D) O POS    Antibody Screen NEG    Sample Expiration      02/17/2020,2359 Performed at [facility], [place] [address], [place], [place] [address]   CBG monitoring, ED     Status: Abnormal   Collection Time: 02/14/20 11:18 PM  Result Value Ref Range   Glucose-Capillary 190 (H) 70 - 99 mg/dL    Comment: Glucose reference range applies only to samples taken after fasting for at least 8 hours.  CBC     Status: Abnormal   Collection Time: 02/15/20  3:00 AM  Result Value Ref Range   WBC 12.9 (H) 4.0 - 10.5 K/uL   RBC 3.56 (L) 4.22 - 5.81 MIL/uL   Hemoglobin 11.5 (L) 13.0 - 17.0 g/dL   HCT 36.1 (L) 39 - 52 %   MCV 101.4 (H) 80.0 - 100.0 fL   MCH 32.3 26.0 - 34.0 pg   MCHC 31.9 30.0 - 36.0 g/dL   RDW 12.6 11.5 - 15.5 %   Platelets 264 150 - 400 K/uL   nRBC 0.0 0.0 - 0.2 %    Comment: Performed at [facility], [place] [address] [place], [place] [address]  Basic metabolic panel     Status: Abnormal   Collection Time: 02/15/20  3:00 AM  Result Value Ref Range   Sodium 139 135 - 145 mmol/L   Potassium 4.2 3.5 - 5.1 mmol/L   Chloride 108 98 - 111 mmol/L   CO2 23 22 - 32 mmol/L   Glucose, Bld 161 (H) 70 - 99 mg/dL    Comment: Glucose reference range applies only to samples taken after fasting for at least 8 hours.   BUN 16 8 - 23 mg/dL   Creatinine, Ser 1.38 (H) 0.61 - 1.24 mg/dL   Calcium 9.0 8.9 - 10.3 mg/dL   GFR, Estimated 51 (L) >60 mL/min    Comment: (NOTE) Calculated using the CKD-EPI Creatinine Equation  (2021)    Anion gap 8 5 - 15    Comment: Performed at [facility] [address], [place], [place] [address]  CBG monitoring, ED     Status: Abnormal   Collection Time: 02/15/20  8:35 AM  Result Value Ref Range   Glucose-Capillary 136 (H) 70 - 99 mg/dL    Comment: Glucose reference range applies only to samples taken after fasting for at least 8 hours.   Comment 1 Notify RN    Comment 2 Document in Chart     DG Chest 1 View  Result Date: 02/14/2020 CLINICAL DATA:  Recent fall with chest pain, possible hip fracture, initial encounter EXAM: CHEST  1 VIEW COMPARISON:  12/17/2007 FINDINGS: Cardiac shadow is stable. Aortic calcifications are noted. The lungs are well aerated bilaterally. No focal infiltrate or sizable effusion is seen. No bony abnormality is noted. IMPRESSION: No active disease. Electronically Signed   By: Inez Catalina M.D.   On: 02/14/2020 17:37   DG Hip Unilat W or Wo Pelvis 2-3 Views Left  Result Date: 02/14/2020 CLINICAL DATA:  Fall with shortening of the left leg EXAM: DG HIP (WITH OR WITHOUT PELVIS) 2-3V LEFT COMPARISON:  None. FINDINGS: SI joints are non widened. Zipper artifact on the images. Right femoral head projects in joint. Pubic symphysis appears intact. Probable chronic fracture deformity at the left inferior pubic ramus. Acute mildly displaced left femoral neck fracture with mild cranial migration of the trochanter. No left femoral head dislocation. Extensive vascular calcification IMPRESSION:  Acute mildly displaced left femoral neck fracture. Probable chronic fracture deformity of the left inferior pubic ramus. Electronically Signed   By: Donavan Foil M.D.   On: 02/14/2020 17:34    Review of Systems  HENT: Negative for ear discharge, ear pain, hearing loss and tinnitus.   Eyes: Negative for photophobia and pain.  Respiratory: Negative for cough and shortness of breath.   Cardiovascular: Negative for chest pain.  Gastrointestinal: Negative for  abdominal pain, nausea and vomiting.  Genitourinary: Negative for dysuria, flank pain, frequency and urgency.  Musculoskeletal: Positive for arthralgias (Left hip). Negative for back pain, myalgias and neck pain.  Neurological: Negative for dizziness and headaches.  Hematological: Does not bruise/bleed easily.  Psychiatric/Behavioral: The patient is not nervous/anxious.    Blood pressure 126/69, pulse 89, temperature (!) 97.4 F (36.3 C), resp. rate 15, SpO2 98 %. Physical Exam Constitutional:      General: He is not in acute distress.    Appearance: He is well-developed. He is not diaphoretic.  HENT:     Head: Normocephalic and atraumatic.  Eyes:     General: No scleral icterus.       Right eye: No discharge.        Left eye: No discharge.     Conjunctiva/sclera: Conjunctivae normal.  Cardiovascular:     Rate and Rhythm: Normal rate and regular rhythm.  Pulmonary:     Effort: Pulmonary effort is normal. No respiratory distress.  Musculoskeletal:     Cervical back: Normal range of motion.     Comments: LLE No traumatic wounds, ecchymosis, or rash  Mod TTP hip  No knee or ankle effusion  Knee stable to varus/ valgus and anterior/posterior stress  Sens DPN, SPN, TN intact  Motor EHL, ext, flex, evers 5/5  DP 2+, PT 1+, No significant edema  Skin:    General: Skin is warm and dry.  Neurological:     Mental Status: He is alert.  Psychiatric:        Behavior: Behavior normal.     Assessment/Plan: [name] -- Plan THA this afternoon by Dr. Lyla Glassing. Please keep NPO. Multiple medical problems including anemia, diabetes, hypertension, CKD 3, hypothyroidism, and blindness -- per primary service    Lisette Abu, PA-C Orthopedic Surgery [phone] 02/15/2020, 9:46 AM

## 2020-02-15 NOTE — Anesthesia Procedure Notes (Signed)
Procedure Name: Intubation Performed by: Cleda Daub, CRNA Pre-anesthesia Checklist: Patient identified, Emergency Drugs available, Suction available and Patient being monitored Patient Re-evaluated:Patient Re-evaluated prior to induction Oxygen Delivery Method: Circle system utilized Preoxygenation: Pre-oxygenation with 100% oxygen Induction Type: IV induction Ventilation: Mask ventilation without difficulty Laryngoscope Size: Miller and 2 Grade View: Grade I Tube type: Oral Tube size: 7.5 mm Number of attempts: 1 Airway Equipment and Method: Stylet and Oral airway Placement Confirmation: ETT inserted through vocal cords under direct vision,  positive ETCO2 and breath sounds checked- equal and bilateral Secured at: 23 cm Tube secured with: Tape Dental Injury: Teeth and Oropharynx as per pre-operative assessment

## 2020-02-15 NOTE — Interval H&P Note (Signed)
History and Physical Interval Note:  02/15/2020 3:51 PM  Douglas Melton  has presented today for surgery, with the diagnosis of RIGHT FRACTURE.  The various methods of treatment have been discussed with the patient and family. After consideration of risks, benefits and other options for treatment, the patient has consented to  Procedure(s): TOTAL HIP ARTHROPLASTY VS TOTAL ANTERIOR APPROACH (Left) as a surgical intervention.  The patient's history has been reviewed, patient examined, no change in status, stable for surgery.  I have reviewed the patient's chart and labs.  Questions were answered to the patient's satisfaction.     Hilton Cork Antania Hoefling

## 2020-02-15 NOTE — Op Note (Signed)
OPERATIVE REPORT  SURGEON: Rod Can, MD   ASSISTANT: April Greene, RNFA  PREOPERATIVE DIAGNOSIS: Displaced Left femoral neck fracture.   POSTOPERATIVE DIAGNOSIS: Displaced Left femoral neck fracture.   PROCEDURE: Left total hip arthroplasty, anterior approach.   IMPLANTS: DePuy Tri Lock stem, size 11, hi offset. DePuy Pinnacle Cup, size 54 mm. DePuy Altrx liner, size 36 by 54 mm, +4 neutral. DePuy metal head ball, size 36 + 8.5 mm.  ANESTHESIA:  General  ANTIBIOTICS: 2g ancef.  ESTIMATED BLOOD LOSS:-600 mL    DRAINS: None.  COMPLICATIONS: None   CONDITION: PACU - hemodynamically stable.   BRIEF CLINICAL NOTE: Douglas Melton is a 84 y.o. male with a displaced Left femoral neck fracture. The patient was admitted to the hospitalist service and underwent perioperative risk stratification and medical optimization. The risks, benefits, and alternatives to total hip arthroplasty were explained, and the patient elected to proceed.  PROCEDURE IN DETAIL: The patient was taken to the operating room and general anesthesia was induced on the hospital bed.  The patient was then positioned on the Hana table.  All bony prominences were well padded.  The hip was prepped and draped in the normal sterile surgical fashion.  A time-out was called verifying side and site of surgery. Antibiotics were given within 60 minutes of beginning the procedure.  The direct anterior approach to the hip was performed through the Hueter interval.  Lateral femoral circumflex vessels were treated with the Auqumantys. The anterior capsule was exposed and an inverted T capsulotomy was made.  Fracture hematoma was encountered and evacuated. The patient was found to have a comminuted Left subcapital femoral neck fracture.  I freshened the femoral neck cut with a saw.  I removed the femoral neck fragment.  A corkscrew was placed into the head and the head was removed.  This was passed to the back table and was  measured.   Acetabular exposure was achieved, and the pulvinar and labrum were excised. Sequential reaming of the acetabulum was then performed up to a size 53 mm reamer. A 54 mm cup was then opened and impacted into place at approximately 40 degrees of abduction and 20 degrees of anteversion. The final polyethylene liner was impacted into place.    I then gained femoral exposure taking care to protect the abductors and greater trochanter.  This was performed using standard external rotation, extension, and adduction.  The capsule was peeled off the inner aspect of the greater trochanter, taking care to preserve the short external rotators. A cookie cutter was used to enter the femoral canal, and then the femoral canal finder was used to confirm location.  I then sequentially broached up to a size 11.  Calcar planer was used on the femoral neck remnant.  I paced a hi neck and a trial head ball. The hip was reduced.  Leg lengths were checked fluoroscopically.  The hip was dislocated and trial components were removed.  I placed the real stem followed by the real spacer and head ball.  The hip was reduced.  Please note that lengthening of several millimeters was required to achieve adequate soft tissue tension and stability. Fluoroscopy was used to confirm component position and leg lengths.  At 90 degrees of external rotation and extension, the hip was stable to an anterior directed force.   The wound was copiously irrigated with Irrisept solution and normal saline using pule lavage.  Marcaine solution was injected into the periarticular soft tissue.  The wound was closed  in layers using #1 Vicryl and V-Loc for the fascia, 2-0 Vicryl for the subcutaneous fat, 2-0 Monocryl for the deep dermal layer, 3-0 running Monocryl subcuticular stitch and glue for the skin.  Once the glue was fully dried, an Aquacell Ag dressing was applied.  The patient was then awakened from anesthesia and transported to the recovery room  in stable condition.  Sponge, needle, and instrument counts were correct at the end of the case x2.  The patient tolerated the procedure well and there were no known complications.  Please note that a surgical assistant was a medical necessity for this procedure to perform it in a safe and expeditious manner. Assistant was necessary to provide appropriate retraction of vital neurovascular structures, to prevent femoral fracture, and to allow for anatomic placement of the prosthesis.

## 2020-02-15 NOTE — Discharge Instructions (Signed)
°Dr. Jayne Peckenpaugh °Joint Replacement Specialist °[facility] °[address] °[place],  [address] °[phone] ° ° °TOTAL HIP REPLACEMENT POSTOPERATIVE DIRECTIONS ° ° ° °Hip Rehabilitation, Guidelines Following Surgery  ° °WEIGHT BEARING °Weight bearing as tolerated with assist device (walker, cane, etc) as directed, use it as long as suggested by your surgeon or therapist, typically at least 4-6 weeks. ° °The results of a hip operation are greatly improved after range of motion and muscle strengthening exercises. Follow all safety measures which are given to protect your hip. If any of these exercises cause increased pain or swelling in your joint, decrease the amount until you are comfortable again. Then slowly increase the exercises. Call your caregiver if you have problems or questions.  ° °HOME CARE INSTRUCTIONS  °Most of the following instructions are designed to prevent the dislocation of your new hip.  °Remove items at home which could result in a fall. This includes throw rugs or furniture in walking pathways.  °Continue medications as instructed at time of discharge. °· You may have some home medications which will be placed on hold until you complete the course of blood thinner medication. °· You may start showering once you are discharged home. Do not remove your dressing. °Do not put on socks or shoes without following the instructions of your caregivers.   °Sit on chairs with arms. Use the chair arms to help push yourself up when arising.  °Arrange for the use of a toilet seat elevator so you are not sitting low.  °· Walk with walker as instructed.  °You may resume a sexual relationship in one month or when given the OK by your caregiver.  °Use walker as long as suggested by your caregivers.  °You may put full weight on your legs and walk as much as is comfortable. °Avoid periods of inactivity such as sitting longer than an hour when not asleep. This helps prevent  blood clots.  °You may return to work once you are cleared by your surgeon.  °Do not drive a car for 6 weeks or until released by your surgeon.  °Do not drive while taking narcotics.  °Wear elastic stockings for two weeks following surgery during the day but you may remove then at night.  °Make sure you keep all of your appointments after your operation with all of your doctors and caregivers. You should call the office at the above phone number and make an appointment for approximately two weeks after the date of your surgery. °Please pick up a stool softener and laxative for home use as long as you are requiring pain medications. °· ICE to the affected hip every three hours for 30 minutes at a time and then as needed for pain and swelling. Continue to use ice on the hip for pain and swelling from surgery. You may notice swelling that will progress down to the foot and ankle.  This is normal after surgery.  Elevate the leg when you are not up walking on it.   °It is important for you to complete the blood thinner medication as prescribed by your doctor. °· Continue to use the breathing machine which will help keep your temperature down.  It is common for your temperature to cycle up and down following surgery, especially at night when you are not up moving around and exerting yourself.  The breathing machine keeps your lungs expanded and your temperature down. ° °RANGE OF MOTION AND STRENGTHENING EXERCISES  °These exercises are   designed to help you keep full movement of your hip joint. Follow your caregiver's or physical therapist's instructions. Perform all exercises about fifteen times, three times per day or as directed. Exercise both hips, even if you have had only one joint replacement. These exercises can be done on a training (exercise) mat, on the floor, on a table or on a bed. Use whatever works the best and is most comfortable for you. Use music or television while you are exercising so that the exercises  are a pleasant break in your day. This will make your life better with the exercises acting as a break in routine you can look forward to.  °Lying on your back, slowly slide your foot toward your buttocks, raising your knee up off the floor. Then slowly slide your foot back down until your leg is straight again.  °Lying on your back spread your legs as far apart as you can without causing discomfort.  °Lying on your side, raise your upper leg and foot straight up from the floor as far as is comfortable. Slowly lower the leg and repeat.  °Lying on your back, tighten up the muscle in the front of your thigh (quadriceps muscles). You can do this by keeping your leg straight and trying to raise your heel off the floor. This helps strengthen the largest muscle supporting your knee.  °Lying on your back, tighten up the muscles of your buttocks both with the legs straight and with the knee bent at a comfortable angle while keeping your heel on the floor.  ° °SKILLED REHAB INSTRUCTIONS: °If the patient is transferred to a skilled rehab facility following release from the hospital, a list of the current medications will be sent to the facility for the patient to continue.  When discharged from the skilled rehab facility, please have the facility set up the patient's Home Health Physical Therapy prior to being released. Also, the skilled facility will be responsible for providing the patient with their medications at time of release from the facility to include their pain medication and their blood thinner medication. If the patient is still at the rehab facility at time of the two week follow up appointment, the skilled rehab facility will also need to assist the patient in arranging follow up appointment in our office and any transportation needs. ° °MAKE SURE YOU:  °Understand these instructions.  °Will watch your condition.  °Will get help right away if you are not doing well or get worse. ° °Pick up stool softner and  laxative for home use following surgery while on pain medications. °Do not remove your dressing. °The dressing is waterproof--it is OK to take showers. °Continue to use ice for pain and swelling after surgery. °Do not use any lotions or creams on the incision until instructed by your surgeon. °Total Hip Protocol. ° ° °

## 2020-02-15 NOTE — Progress Notes (Signed)
PROGRESS NOTE    Douglas Melton  WUJ:[id number] DOB: February 21, 1936 DOA: 02/14/2020 PCP: [name], MD   Brief Narrative: 84 years old male with medical history significant for chronic anemia, diabetes, hypertension, CKD stage III, hypothyroidism, blindness presents s/p fall at home.  He denies any LOC or head injury.  He is found to have left femoral neck fracture on x-ray.  He is also found to have prolonged QTC which resolved.  Orthopedics consulted,  Patient is scheduled to have left hip arthroplasty today.  Assessment & Plan:   Principal Problem:   Left displaced femoral neck fracture (HCC) Active Problems:   Diabetes mellitus without complication ([place])   Hypertension associated with diabetes ([place])   CKD (chronic kidney disease), stage III ([place])   Hypothyroidism   Left displaced femoral neck fracture - Orthopedics consulted in ED,  Scheduled to have Left total hip arthroplasty today. - Pain control with Norco every 6 hours as needed for moderate pain and morphine 0.2 mg every 4 hours as needed for severe pain - One-time dose of Compazine for nausea - SCDs  Prolonged QT > Improved - QTC elevated to 539 - Avoid QTC prolonging medications - Monitor on telemetry - Did receive one-time dose of Compazine for nausea.  CKD 3 - Creatinine at baseline of 1.5  - Avid nephrotoxic medications.  Hypothyroidism - Continue home Synthroid  Diabetes - Takes Metformin at home - Sliding scale insulin here.  Hypertension -Continue home lisinopril  DVT prophylaxis: SCDS Code Status: Full Family Communication:  No one at bed side. Disposition Plan:  Status is: Inpatient  Remains inpatient appropriate because:Inpatient level of care appropriate due to severity of illness   Dispo: The patient is from: Home              Anticipated d/c is to: SNF              Anticipated d/c date is: 2 days              Patient currently is not medically stable to d/c.   Consultants:     orthopeadics  Procedures:  Left THA  Antimicrobials:  Anti-infectives (From admission, onward)   None      Subjective: Patient is seen and examined at bedside.  Overnight events noted.  Patient reports pain is better controlled after he received pain medications.  He is aware that he is going to have surgery today.  Objective: Vitals:   02/15/20 0900 02/15/20 0904 02/15/20 0908 02/15/20 1000  BP: 126/69   (!) 142/76  Pulse:  92 89 83  Resp: (!) 26 19 15 15   Temp:      TempSrc:      SpO2: 95% 98% 98% 98%   No intake or output data in the 24 hours ending 02/15/20 1211 There were no vitals filed for this visit.  Examination:  General exam: Appears calm and comfortable. Respiratory system: Clear to auscultation. Respiratory effort normal. Cardiovascular system: S1 & S2 heard, RRR. No JVD, murmurs, rubs, gallops or clicks. No pedal edema. Gastrointestinal system: Abdomen is nondistended, soft and nontender. No organomegaly or masses felt. Normal bowel sounds heard. Central nervous system: Alert and oriented. No focal neurological deficits. Extremities: Left hip tenderness noted. ROM: restricted. Skin: No rashes, lesions or ulcers Psychiatry: Judgement and insight appear normal. Mood & affect appropriate.     Data Reviewed: I have personally reviewed following labs and imaging studies  CBC: Recent Labs  Lab 02/14/20 1631 02/15/20 0300  WBC 7.5 12.9*  NEUTROABS 4.6  --   HGB 12.1* 11.5*  HCT 38.1* 36.1*  MCV 104.1* 101.4*  PLT 287 542   Basic Metabolic Panel: Recent Labs  Lab 02/14/20 1631 02/15/20 0300  NA 139 139  K 4.6 4.2  CL 110 108  CO2 19* 23  GLUCOSE 182* 161*  BUN 18 16  CREATININE 1.55* 1.38*  CALCIUM 9.0 9.0   GFR: CrCl cannot be calculated (Unknown ideal weight.). Liver Function Tests: Recent Labs  Lab 02/14/20 1631  AST 15  ALT 12  ALKPHOS 89  BILITOT 0.6  PROT 6.3*  ALBUMIN 3.5   No results for input(s): LIPASE, AMYLASE in the  last 168 hours. No results for input(s): AMMONIA in the last 168 hours. Coagulation Profile: Recent Labs  Lab 02/14/20 1631  INR 1.1   Cardiac Enzymes: No results for input(s): CKTOTAL, CKMB, CKMBINDEX, TROPONINI in the last 168 hours. BNP (last 3 results) No results for input(s): PROBNP in the last 8760 hours. HbA1C: No results for input(s): HGBA1C in the last 72 hours. CBG: Recent Labs  Lab 02/14/20 2318 02/15/20 0835  GLUCAP 190* 136*   Lipid Profile: No results for input(s): CHOL, HDL, LDLCALC, TRIG, CHOLHDL, LDLDIRECT in the last 72 hours. Thyroid Function Tests: No results for input(s): TSH, T4TOTAL, FREET4, T3FREE, THYROIDAB in the last 72 hours. Anemia Panel: No results for input(s): VITAMINB12, FOLATE, FERRITIN, TIBC, IRON, RETICCTPCT in the last 72 hours. Sepsis Labs: No results for input(s): PROCALCITON, LATICACIDVEN in the last 168 hours.  Recent Results (from the past 240 hour(s))  Respiratory Panel by RT PCR (Flu A&B, Covid) - Nasopharyngeal Swab     Status: None   Collection Time: 02/14/20  4:32 PM   Specimen: Nasopharyngeal Swab  Result Value Ref Range Status   SARS Coronavirus 2 by RT PCR NEGATIVE NEGATIVE Final    Comment: (NOTE) SARS-CoV-2 target nucleic acids are NOT DETECTED.  The SARS-CoV-2 RNA is generally detectable in upper respiratoy specimens during the acute phase of infection. The lowest concentration of SARS-CoV-2 viral copies this assay can detect is 131 copies/mL. A negative result does not preclude SARS-Cov-2 infection and should not be used as the sole basis for treatment or other patient management decisions. A negative result may occur with  improper specimen collection/handling, submission of specimen other than nasopharyngeal swab, presence of viral mutation(s) within the areas targeted by this assay, and inadequate number of viral copies (<131 copies/mL). A negative result must be combined with clinical observations, patient  history, and epidemiological information. The expected result is Negative.  Fact Sheet for Patients:  PinkCheek.be  Fact Sheet for Healthcare Providers:  GravelBags.it  This test is no t yet approved or cleared by the [place] FDA and  has been authorized for detection and/or diagnosis of SARS-CoV-2 by FDA under an Emergency Use Authorization (EUA). This EUA will remain  in effect (meaning this test can be used) for the duration of the COVID-19 declaration under Section 564(b)(1) of the Act, 21 U.S.C. section 360bbb-3(b)(1), unless the authorization is terminated or revoked sooner.     Influenza A by PCR NEGATIVE NEGATIVE Final   Influenza B by PCR NEGATIVE NEGATIVE Final    Comment: (NOTE) The Xpert Xpress SARS-CoV-2/FLU/RSV assay is intended as an aid in  the diagnosis of influenza from Nasopharyngeal swab specimens and  should not be used as a sole basis for treatment. Nasal washings and  aspirates are unacceptable for Xpert Xpress SARS-CoV-2/FLU/RSV  testing.  Fact Sheet for Patients: PinkCheek.be  Fact Sheet for Healthcare Providers: GravelBags.it  This test is not yet approved or cleared by the [place] FDA and  has been authorized for detection and/or diagnosis of SARS-CoV-2 by  FDA under an Emergency Use Authorization (EUA). This EUA will remain  in effect (meaning this test can be used) for the duration of the  Covid-19 declaration under Section 564(b)(1) of the Act, 21  U.S.C. section 360bbb-3(b)(1), unless the authorization is  terminated or revoked. Performed at [facility], [place] [address] [place], [place] [address]     Radiology Studies: DG Chest 1 View  Result Date: 02/14/2020 CLINICAL DATA:  Recent fall with chest pain, possible hip fracture, initial encounter EXAM: CHEST  1 VIEW COMPARISON:  12/17/2007 FINDINGS: Cardiac  shadow is stable. Aortic calcifications are noted. The lungs are well aerated bilaterally. No focal infiltrate or sizable effusion is seen. No bony abnormality is noted. IMPRESSION: No active disease. Electronically Signed   By: Inez Catalina M.D.   On: 02/14/2020 17:37   DG Knee Left Port  Result Date: 02/15/2020 CLINICAL DATA:  Left femoral neck fracture EXAM: PORTABLE LEFT KNEE - 1-2 VIEW COMPARISON:  None. FINDINGS: No evidence of fracture, dislocation, or joint effusion. Mild tricompartmental joint space narrowing. Bidirectional patellar enthesophytes. Extensive vascular calcifications. No focal soft tissue swelling. IMPRESSION: 1. No acute osseous abnormality, left knee. 2. Mild tricompartmental osteoarthritis. Electronically Signed   By: Davina Poke D.O.   On: 02/15/2020 11:10   DG Hip Unilat W or Wo Pelvis 2-3 Views Left  Result Date: 02/14/2020 CLINICAL DATA:  Fall with shortening of the left leg EXAM: DG HIP (WITH OR WITHOUT PELVIS) 2-3V LEFT COMPARISON:  None. FINDINGS: SI joints are non widened. Zipper artifact on the images. Right femoral head projects in joint. Pubic symphysis appears intact. Probable chronic fracture deformity at the left inferior pubic ramus. Acute mildly displaced left femoral neck fracture with mild cranial migration of the trochanter. No left femoral head dislocation. Extensive vascular calcification IMPRESSION: Acute mildly displaced left femoral neck fracture. Probable chronic fracture deformity of the left inferior pubic ramus. Electronically Signed   By: Donavan Foil M.D.   On: 02/14/2020 17:34   Scheduled Meds: . acetaminophen  1,000 mg Oral Once  . dorzolamide-timolol  1 drop Left Eye BID  . insulin aspart  0-9 Units Subcutaneous TID WC  . levothyroxine  88 mcg Oral QAC breakfast  . lisinopril  5 mg Oral Daily  . multivitamin with minerals  1 tablet Oral Daily   Continuous Infusions:   LOS: 1 day    Time spent: 35 mins    Sulma Ruffino,  MD [facility]   If 7PM-7AM, please contact night-coverage

## 2020-02-15 NOTE — ED Notes (Signed)
Change of shift first contact. Pt resting in bed. DP pulses identified 3+ bilaterally. Reporting L Hip pain. Family at bedside.

## 2020-02-16 ENCOUNTER — Other Ambulatory Visit: Payer: Self-pay

## 2020-02-16 LAB — CBC
HCT: 27.8 % — ABNORMAL LOW (ref 39.0–52.0)
Hemoglobin: 8.9 g/dL — ABNORMAL LOW (ref 13.0–17.0)
MCH: 33 pg (ref 26.0–34.0)
MCHC: 32 g/dL (ref 30.0–36.0)
MCV: 103 fL — ABNORMAL HIGH (ref 80.0–100.0)
Platelets: 225 10*3/uL (ref 150–400)
RBC: 2.7 MIL/uL — ABNORMAL LOW (ref 4.22–5.81)
RDW: 13.2 % (ref 11.5–15.5)
WBC: 15.3 10*3/uL — ABNORMAL HIGH (ref 4.0–10.5)
nRBC: 0 % (ref 0.0–0.2)

## 2020-02-16 LAB — BASIC METABOLIC PANEL
Anion gap: 6 (ref 5–15)
BUN: 17 mg/dL (ref 8–23)
CO2: 26 mmol/L (ref 22–32)
Calcium: 8.5 mg/dL — ABNORMAL LOW (ref 8.9–10.3)
Chloride: 107 mmol/L (ref 98–111)
Creatinine, Ser: 1.65 mg/dL — ABNORMAL HIGH (ref 0.61–1.24)
GFR, Estimated: 41 mL/min — ABNORMAL LOW (ref >60)
Glucose, Bld: 189 mg/dL — ABNORMAL HIGH (ref 70–99)
Potassium: 5.2 mmol/L — ABNORMAL HIGH (ref 3.5–5.1)
Sodium: 139 mmol/L (ref 135–145)

## 2020-02-16 LAB — HEMOGLOBIN A1C
Hgb A1c MFr Bld: 5.7 % — ABNORMAL HIGH (ref 4.8–5.6)
Mean Plasma Glucose: 117 mg/dL

## 2020-02-16 LAB — HEMOGLOBIN AND HEMATOCRIT, BLOOD
HCT: 28.9 % — ABNORMAL LOW (ref 39.0–52.0)
Hemoglobin: 9.1 g/dL — ABNORMAL LOW (ref 13.0–17.0)

## 2020-02-16 LAB — GLUCOSE, CAPILLARY
Glucose-Capillary: 158 mg/dL — ABNORMAL HIGH (ref 70–99)
Glucose-Capillary: 162 mg/dL — ABNORMAL HIGH (ref 70–99)
Glucose-Capillary: 188 mg/dL — ABNORMAL HIGH (ref 70–99)
Glucose-Capillary: 210 mg/dL — ABNORMAL HIGH (ref 70–99)

## 2020-02-16 LAB — POTASSIUM: Potassium: 4.4 mmol/L (ref 3.5–5.1)

## 2020-02-16 NOTE — Evaluation (Signed)
Physical Therapy Evaluation Patient Details Name: Douglas Melton MRN: [id number] DOB: 07/25/1935 Today's Date: 02/16/2020   History of Present Illness  Douglas Melton is a 84 y.o. male with medical history significant of anemia, diabetes, hypertension, CKD 3, hypothyroidism, blindness who presents following a fall at home.  Patient was attempting to move a drawer anterior chest of drawers when he tripped over a table  Clinical Impression  Patient received in bed, he is agreeable to PT session. Reports minimal pain. Patient is blind. He required min assist for supine to sit. Min assist for sit to stand. Upon standing, patient reports he feels like he may pass out. Therefore returned to sitting on bed. He would feel better with sitting and attempt to stand 2x more with same result upon standing. Unable to attempt ambulation due to feeling faint. He will continue to benefit from skilled PT while here to improve functional mobility and independence. He is motivated to improve and I am hopeful that he will ambulate well and be able to return home at discharge.     Follow Up Recommendations Home health PT;Supervision - Intermittent    Equipment Recommendations  None recommended by PT    Recommendations for Other Services       Precautions / Restrictions Precautions Precautions: Fall Precaution Comments: patient is blind Restrictions Weight Bearing Restrictions: Yes LLE Weight Bearing: Weight bearing as tolerated      Mobility  Bed Mobility Overal bed mobility: Needs Assistance Bed Mobility: Supine to Sit;Sit to Supine     Supine to sit: Min assist Sit to supine: Min assist   General bed mobility comments: required min assist to bring legs on and off bed, min assist to raise trunk to seated position. Patient Response: Cooperative  Transfers Overall transfer level: Needs assistance Equipment used: Rolling walker (2 wheeled) Transfers: Sit to/from Stand Sit to Stand: Min  assist         General transfer comment: required min assist for sit to stand. Upon standing patient reports feels like he is about to pass out. therefore returned to sitting. Attempted this 2 more times with same result.  Ambulation/Gait             General Gait Details: unable due to being faint with standing.  Stairs            Wheelchair Mobility    Modified Rankin (Stroke Patients Only)       Balance Overall balance assessment: Needs assistance Sitting-balance support: Feet supported Sitting balance-Leahy Scale: Good     Standing balance support: Bilateral upper extremity supported;During functional activity Standing balance-Leahy Scale: Fair Standing balance comment: reliant on B UE support                             Pertinent Vitals/Pain Pain Assessment: Faces Faces Pain Scale: Hurts a little bit Pain Location: L hip Pain Descriptors / Indicators: Aching;Grimacing;Guarding;Operative site guarding;Discomfort Pain Intervention(s): Limited activity within patient's tolerance;Monitored during session;Repositioned    Home Living Family/patient expects to be discharged to:: Private residence Living Arrangements: Spouse/significant other Available Help at Discharge: Family;Available 24 hours/day Type of Home: House Home Access: Other (comment) (patient has chair lift at basement stairs)     Home Layout: One level Home Equipment: Walker - 2 wheels;Grab bars - tub/shower;Shower seat      Prior Function Level of Independence: Independent         Comments: patient reports he does not use  AD at baseline     Hand Dominance        Extremity/Trunk Assessment   Upper Extremity Assessment Upper Extremity Assessment: Overall WFL for tasks assessed    Lower Extremity Assessment Lower Extremity Assessment: LLE deficits/detail LLE Coordination: decreased gross motor    Cervical / Trunk Assessment Cervical / Trunk Assessment: Normal   Communication   Communication: No difficulties  Cognition Arousal/Alertness: Awake/alert Behavior During Therapy: WFL for tasks assessed/performed Overall Cognitive Status: Within Functional Limits for tasks assessed                                        General Comments      Exercises     Assessment/Plan    PT Assessment Patient needs continued PT services  PT Problem List Decreased strength;Decreased mobility;Decreased activity tolerance;Decreased balance;Pain       PT Treatment Interventions DME instruction;Therapeutic activities;Gait training;Therapeutic exercise;Patient/family education;Functional mobility training;Balance training;Neuromuscular re-education    PT Goals (Current goals can be found in the Care Plan section)  Acute Rehab PT Goals Patient Stated Goal: to hopefully return home if he can get around PT Goal Formulation: With patient/family Time For Goal Achievement: 02/29/20 Potential to Achieve Goals: Fair    Frequency Min 5X/week   Barriers to discharge        Co-evaluation               AM-PAC PT "6 Clicks" Mobility  Outcome Measure Help needed turning from your back to your side while in a flat bed without using bedrails?: A Little Help needed moving from lying on your back to sitting on the side of a flat bed without using bedrails?: A Little Help needed moving to and from a bed to a chair (including a wheelchair)?: A Lot Help needed standing up from a chair using your arms (e.g., wheelchair or bedside chair)?: A Little Help needed to walk in hospital room?: Total Help needed climbing 3-5 steps with a railing? : Total 6 Click Score: 13    End of Session Equipment Utilized During Treatment: Gait belt Activity Tolerance: Treatment limited secondary to medical complications (Comment) (patient very light headed with standing. Unable to attempt walking) Patient left: in bed;with call bell/phone within reach;with bed alarm  set;with family/visitor present Nurse Communication: Mobility status PT Visit Diagnosis: Muscle weakness (generalized) (M62.81);Difficulty in walking, not elsewhere classified (R26.2);History of falling (Z91.81);Other abnormalities of gait and mobility (R26.89)    Time: 3382-5053 PT Time Calculation (min) (ACUTE ONLY): 34 min   Charges:   PT Evaluation $PT Eval Moderate Complexity: 1 Mod PT Treatments $Therapeutic Activity: 8-22 mins        Ashani Pumphrey, PT, GCS 02/16/20,1:46 PM

## 2020-02-16 NOTE — Anesthesia Postprocedure Evaluation (Signed)
Anesthesia Post Note  Patient: Douglas Melton  Procedure(s) Performed: TOTAL HIP ARTHROPLASTY  ANTERIOR APPROACH (Left Hip)     Patient location during evaluation: PACU Anesthesia Type: General Level of consciousness: awake and alert Pain management: pain level controlled Vital Signs Assessment: post-procedure vital signs reviewed and stable Respiratory status: spontaneous breathing, nonlabored ventilation, respiratory function stable and patient connected to nasal cannula oxygen Cardiovascular status: blood pressure returned to baseline and stable Postop Assessment: no apparent nausea or vomiting Anesthetic complications: no   No complications documented.  Last Vitals:  Vitals:   02/16/20 1144 02/16/20 1418  BP: 112/60 103/69  Pulse: 98 99  Resp: 20 18  Temp: 37.1 C 37 C  SpO2: 96% 98%    Last Pain:  Vitals:   02/16/20 1418  TempSrc: Oral  PainSc:                  March Rummage Briah Nary

## 2020-02-16 NOTE — Progress Notes (Signed)
    Subjective: 1 Day Post-Op Procedure(s) (LRB): TOTAL HIP ARTHROPLASTY  ANTERIOR APPROACH (Left) Patient reports pain as 2 on 0-10 scale.   Denies CP or SOB.  Voiding without difficulty. Positive flatus. Objective: Vital signs in last 24 hours: Temp:  [97.6 F (36.4 C)-99.3 F (37.4 C)] 98.8 F (37.1 C) (10/23 1144) Pulse Rate:  [90-105] 98 (10/23 1144) Resp:  [12-20] 20 (10/23 1144) BP: (107-160)/(46-91) 112/60 (10/23 1144) SpO2:  [96 %-100 %] 96 % (10/23 1144) Weight:  [68 kg] 68 kg (10/22 2021)  Intake/Output from previous day: 10/22 0701 - 10/23 0700 In: 2430 [P.O.:180; I.V.:1600; IV Piggyback:650] Out: 1300 [Urine:700; Blood:600] Intake/Output this shift: Total I/O In: 240 [P.O.:240] Out: 100 [Urine:100]  Labs: Recent Labs    02/14/20 1631 02/15/20 0300 02/16/20 0149 02/16/20 1029  HGB 12.1* 11.5* 8.9* 9.1*   Recent Labs    02/15/20 0300 02/15/20 0300 02/16/20 0149 02/16/20 1029  WBC 12.9*  --  15.3*  --   RBC 3.56*  --  2.70*  --   HCT 36.1*   < > 27.8* 28.9*  PLT 264  --  225  --    < > = values in this interval not displayed.   Recent Labs    02/15/20 0300 02/15/20 0300 02/16/20 0149 02/16/20 1029  NA 139  --  139  --   K 4.2   < > 5.2* 4.4  CL 108  --  107  --   CO2 23  --  26  --   BUN 16  --  17  --   CREATININE 1.38*  --  1.65*  --   GLUCOSE 161*  --  189*  --   CALCIUM 9.0  --  8.5*  --    < > = values in this interval not displayed.   Recent Labs    02/14/20 1631  INR 1.1    Physical Exam: Neurologically intact ABD soft Intact pulses distally Incision: dressing C/D/I and no drainage Compartment soft Body mass index is 19.26 kg/m.   Assessment/Plan: 1 Day Post-Op Procedure(s) (LRB): TOTAL HIP ARTHROPLASTY  ANTERIOR APPROACH (Left) Advance diet Up with therapy  Patient doing well   Dahlia Bailiff for Dr. Melina Schools [facility] [phone] 02/16/2020, 1:31 PM

## 2020-02-16 NOTE — Progress Notes (Signed)
PROGRESS NOTE    Douglas Melton  WLN:[id number] DOB: [birth year] DOA: 02/14/2020 PCP: [name], MD   Brief Narrative: 84 years old male with medical history significant for chronic anemia, diabetes, hypertension, CKD stage III, hypothyroidism, blindness presents s/p fall at home.  He denies any LOC or head injury.  He is found to have left femoral neck fracture on x-ray.  He is also found to have prolonged QTC which resolved.  Orthopedics consulted,  Patient underwent left hip arthroplasty  10/22, tolerated well. He is doing much better, PT recommended home with Home PT.  Assessment & Plan:   Principal Problem:   Left displaced femoral neck fracture (HCC) Active Problems:   Diabetes mellitus without complication ([place])   Hypertension associated with diabetes ([place])   CKD (chronic kidney disease), stage III ([place])   Hypothyroidism   Left displaced femoral neck fracture - Orthopedics consulted in ED,  underwent Left total hip arthroplasty 02/15/20. - Pain control with Norco every 6 hours as needed for moderate pain and morphine 0.2 mg every 4 hours as needed for severe pain - One-time dose of Compazine for nausea. - SCDs  Prolonged QT > Improved - QTC elevated to 539 - Avoid QTC prolonging medications - Monitor on telemetry - Did receive one-time dose of Compazine for nausea.  CKD 3 - Creatinine at baseline of 1.5  - Avid nephrotoxic medications.  Hypothyroidism - Continue home Synthroid  Diabetes - Takes Metformin at home. - Sliding scale insulin here.  Hypertension -Continue home lisinopril  DVT prophylaxis: SCDS Code Status: Full Family Communication:  No one at bed side. Disposition Plan:  Status is: Inpatient  Remains inpatient appropriate because:Inpatient level of care appropriate due to severity of illness   Dispo: The patient is from: Home              Anticipated d/c is to: SNF              Anticipated d/c date is: 2 days              Patient  currently is not medically stable to d/c.   Consultants:   orthopeadics  Procedures:  Left THA  Antimicrobials:  Anti-infectives (From admission, onward)   Start     Dose/Rate Route Frequency Ordered Stop   02/16/20 0600  ceFAZolin (ANCEF) IVPB 2g/100 mL premix        2 g 200 mL/hr over 30 Minutes Intravenous On call to O.R. 02/15/20 1428 02/15/20 1608   02/15/20 2200  ceFAZolin (ANCEF) IVPB 2g/100 mL premix        2 g 200 mL/hr over 30 Minutes Intravenous Every 6 hours 02/15/20 2010 02/16/20 0454   02/15/20 1430  ceFAZolin (ANCEF) 2-4 GM/100ML-% IVPB       Note to Pharmacy: Ladoris Gene   : cabinet override      02/15/20 1430 02/15/20 1638      Subjective: Patient is seen and examined at bedside.  Overnight events noted.  Patient underwent Left Hip hemiarthroplasty, tolerated well, reports pain is better controlled, participated in physical therapy.   Objective: Vitals:   02/16/20 0016 02/16/20 0432 02/16/20 0728 02/16/20 1144  BP: (!) 107/58 (!) 111/58 (!) 111/46 112/60  Pulse: 91 98 99 98  Resp: 18 14 18 20   Temp: 97.8 F (36.6 C) 99.3 F (37.4 C) 98.7 F (37.1 C) 98.8 F (37.1 C)  TempSrc: Oral Oral Oral Oral  SpO2: 97% 97% 96% 96%  Weight:  Height:        Intake/Output Summary (Last 24 hours) at 02/16/2020 1350 Last data filed at 02/16/2020 1147 Gross per 24 hour  Intake 2670 ml  Output 1400 ml  Net 1270 ml   Filed Weights   02/15/20 2021  Weight: 68 kg    Examination:  General exam: Appears calm and comfortable. Respiratory system: Clear to auscultation. Respiratory effort normal. Cardiovascular system: S1 & S2 heard, RRR. No JVD, murmurs, rubs, gallops or clicks. No pedal edema. Gastrointestinal system: Abdomen is nondistended, soft and nontender. No organomegaly or masses felt. Normal bowel sounds heard. Central nervous system: Alert and oriented. No focal neurological deficits. Extremities: Left hip tenderness noted. ROM:  restricted. Skin: No rashes, lesions or ulcers Psychiatry: Judgement and insight appear normal. Mood & affect appropriate.     Data Reviewed: I have personally reviewed following labs and imaging studies  CBC: Recent Labs  Lab 02/14/20 1631 02/15/20 0300 02/16/20 0149 02/16/20 1029  WBC 7.5 12.9* 15.3*  --   NEUTROABS 4.6  --   --   --   HGB 12.1* 11.5* 8.9* 9.1*  HCT 38.1* 36.1* 27.8* 28.9*  MCV 104.1* 101.4* 103.0*  --   PLT 287 264 225  --    Basic Metabolic Panel: Recent Labs  Lab 02/14/20 1631 02/15/20 0300 02/16/20 0149 02/16/20 1029  NA 139 139 139  --   K 4.6 4.2 5.2* 4.4  CL 110 108 107  --   CO2 19* 23 26  --   GLUCOSE 182* 161* 189*  --   BUN 18 16 17   --   CREATININE 1.55* 1.38* 1.65*  --   CALCIUM 9.0 9.0 8.5*  --    GFR: Estimated Creatinine Clearance: 33.2 mL/min (A) (by C-G formula based on SCr of 1.65 mg/dL (H)). Liver Function Tests: Recent Labs  Lab 02/14/20 1631  AST 15  ALT 12  ALKPHOS 89  BILITOT 0.6  PROT 6.3*  ALBUMIN 3.5   No results for input(s): LIPASE, AMYLASE in the last 168 hours. No results for input(s): AMMONIA in the last 168 hours. Coagulation Profile: Recent Labs  Lab 02/14/20 1631  INR 1.1   Cardiac Enzymes: No results for input(s): CKTOTAL, CKMB, CKMBINDEX, TROPONINI in the last 168 hours. BNP (last 3 results) No results for input(s): PROBNP in the last 8760 hours. HbA1C: Recent Labs    02/14/20 2234  HGBA1C 5.7*   CBG: Recent Labs  Lab 02/15/20 1242 02/15/20 1341 02/15/20 1755 02/16/20 0646 02/16/20 1142  GLUCAP 128* 124* 146* 158* 162*   Lipid Profile: No results for input(s): CHOL, HDL, LDLCALC, TRIG, CHOLHDL, LDLDIRECT in the last 72 hours. Thyroid Function Tests: No results for input(s): TSH, T4TOTAL, FREET4, T3FREE, THYROIDAB in the last 72 hours. Anemia Panel: No results for input(s): VITAMINB12, FOLATE, FERRITIN, TIBC, IRON, RETICCTPCT in the last 72 hours. Sepsis Labs: No results for  input(s): PROCALCITON, LATICACIDVEN in the last 168 hours.  Recent Results (from the past 240 hour(s))  Respiratory Panel by RT PCR (Flu A&B, Covid) - Nasopharyngeal Swab     Status: None   Collection Time: 02/14/20  4:32 PM   Specimen: Nasopharyngeal Swab  Result Value Ref Range Status   SARS Coronavirus 2 by RT PCR NEGATIVE NEGATIVE Final    Comment: (NOTE) SARS-CoV-2 target nucleic acids are NOT DETECTED.  The SARS-CoV-2 RNA is generally detectable in upper respiratoy specimens during the acute phase of infection. The lowest concentration of SARS-CoV-2 viral copies this assay  can detect is 131 copies/mL. A negative result does not preclude SARS-Cov-2 infection and should not be used as the sole basis for treatment or other patient management decisions. A negative result may occur with  improper specimen collection/handling, submission of specimen other than nasopharyngeal swab, presence of viral mutation(s) within the areas targeted by this assay, and inadequate number of viral copies (<131 copies/mL). A negative result must be combined with clinical observations, patient history, and epidemiological information. The expected result is Negative.  Fact Sheet for Patients:  PinkCheek.be  Fact Sheet for Healthcare Providers:  GravelBags.it  This test is no t yet approved or cleared by the [place] FDA and  has been authorized for detection and/or diagnosis of SARS-CoV-2 by FDA under an Emergency Use Authorization (EUA). This EUA will remain  in effect (meaning this test can be used) for the duration of the COVID-19 declaration under Section 564(b)(1) of the Act, 21 U.S.C. section 360bbb-3(b)(1), unless the authorization is terminated or revoked sooner.     Influenza A by PCR NEGATIVE NEGATIVE Final   Influenza B by PCR NEGATIVE NEGATIVE Final    Comment: (NOTE) The Xpert Xpress SARS-CoV-2/FLU/RSV assay is intended  as an aid in  the diagnosis of influenza from Nasopharyngeal swab specimens and  should not be used as a sole basis for treatment. Nasal washings and  aspirates are unacceptable for Xpert Xpress SARS-CoV-2/FLU/RSV  testing.  Fact Sheet for Patients: PinkCheek.be  Fact Sheet for Healthcare Providers: GravelBags.it  This test is not yet approved or cleared by the [place] FDA and  has been authorized for detection and/or diagnosis of SARS-CoV-2 by  FDA under an Emergency Use Authorization (EUA). This EUA will remain  in effect (meaning this test can be used) for the duration of the  Covid-19 declaration under Section 564(b)(1) of the Act, 21  U.S.C. section 360bbb-3(b)(1), unless the authorization is  terminated or revoked. Performed at [facility], [place] [address], [place], [place] [address]     Radiology Studies: DG Chest 1 View  Result Date: 02/14/2020 CLINICAL DATA:  Recent fall with chest pain, possible hip fracture, initial encounter EXAM: CHEST  1 VIEW COMPARISON:  12/17/2007 FINDINGS: Cardiac shadow is stable. Aortic calcifications are noted. The lungs are well aerated bilaterally. No focal infiltrate or sizable effusion is seen. No bony abnormality is noted. IMPRESSION: No active disease. Electronically Signed   By: Inez Catalina M.D.   On: 02/14/2020 17:37   Pelvis Portable  Result Date: 02/15/2020 CLINICAL DATA:  Postsurgical changes EXAM: PORTABLE PELVIS 1-2 VIEWS COMPARISON:  Radiograph same day FINDINGS: The patient is status post left total hip arthroplasty without without a periprosthetic fracture. Is there is a probable chronic appearing inferior pubic rami fracture on the left. Overlying subcutaneous emphysema is seen. IMPRESSION: Status post left total hip arthroplasty without acute complication. Electronically Signed   By: Prudencio Pair M.D.   On: 02/15/2020 19:06   DG Knee Left Port  Result Date:  02/15/2020 CLINICAL DATA:  Left femoral neck fracture EXAM: PORTABLE LEFT KNEE - 1-2 VIEW COMPARISON:  None. FINDINGS: No evidence of fracture, dislocation, or joint effusion. Mild tricompartmental joint space narrowing. Bidirectional patellar enthesophytes. Extensive vascular calcifications. No focal soft tissue swelling. IMPRESSION: 1. No acute osseous abnormality, left knee. 2. Mild tricompartmental osteoarthritis. Electronically Signed   By: Davina Poke D.O.   On: 02/15/2020 11:10   DG C-Arm 1-60 Min  Result Date: 02/15/2020 CLINICAL DATA:  Left hip replacement EXAM:  OPERATIVE LEFT HIP (WITH PELVIS IF PERFORMED) 4 VIEWS TECHNIQUE: Fluoroscopic spot image(s) were submitted for interpretation post-operatively. COMPARISON:  02/14/2020 FINDINGS: Multiple intraoperative spot hip replacement images demonstrate. Normal AP changes of left alignment. No visible hardware or bony complicating feature. IMPRESSION: Left hip replacement.  No visible complicating feature. Electronically Signed   By: Rolm Baptise M.D.   On: 02/15/2020 17:46   DG HIP OPERATIVE UNILAT W OR W/O PELVIS LEFT  Result Date: 02/15/2020 CLINICAL DATA:  Left hip replacement EXAM: OPERATIVE LEFT HIP (WITH PELVIS IF PERFORMED) 4 VIEWS TECHNIQUE: Fluoroscopic spot image(s) were submitted for interpretation post-operatively. COMPARISON:  02/14/2020 FINDINGS: Multiple intraoperative spot hip replacement images demonstrate. Normal AP changes of left alignment. No visible hardware or bony complicating feature. IMPRESSION: Left hip replacement.  No visible complicating feature. Electronically Signed   By: Rolm Baptise M.D.   On: 02/15/2020 17:46   DG Hip Unilat W or Wo Pelvis 2-3 Views Left  Result Date: 02/14/2020 CLINICAL DATA:  Fall with shortening of the left leg EXAM: DG HIP (WITH OR WITHOUT PELVIS) 2-3V LEFT COMPARISON:  None. FINDINGS: SI joints are non widened. Zipper artifact on the images. Right femoral head projects in joint.  Pubic symphysis appears intact. Probable chronic fracture deformity at the left inferior pubic ramus. Acute mildly displaced left femoral neck fracture with mild cranial migration of the trochanter. No left femoral head dislocation. Extensive vascular calcification IMPRESSION: Acute mildly displaced left femoral neck fracture. Probable chronic fracture deformity of the left inferior pubic ramus. Electronically Signed   By: Donavan Foil M.D.   On: 02/14/2020 17:34   Scheduled Meds: . aspirin EC  325 mg Oral Q breakfast  . docusate sodium  100 mg Oral BID  . dorzolamide-timolol  1 drop Left Eye BID  . insulin aspart  0-9 Units Subcutaneous TID WC  . levothyroxine  88 mcg Oral QAC breakfast  . lisinopril  5 mg Oral Daily  . multivitamin with minerals  1 tablet Oral Daily   Continuous Infusions: . methocarbamol (ROBAXIN) IV       LOS: 2 days    Time spent: 25 mins    Shawna Clamp, MD [facility]   If 7PM-7AM, please contact night-coverage

## 2020-02-16 NOTE — Plan of Care (Signed)
  Problem: Coping: Goal: Level of anxiety will decrease Outcome: Progressing   Problem: Pain Managment: Goal: General experience of comfort will improve Outcome: Progressing   Problem: Safety: Goal: Ability to remain free from injury will improve Outcome: Progressing   Problem: Skin Integrity: Goal: Risk for impaired skin integrity will decrease Outcome: Progressing   

## 2020-02-16 NOTE — Plan of Care (Signed)
  Problem: Activity: Goal: Ability to ambulate and perform ADLs will improve Outcome: Progressing   Problem: Pain Management: Goal: Pain level will decrease Outcome: Progressing   Problem: Education: Goal: Knowledge of General Education information will improve Description: Including pain rating scale, medication(s)/side effects and non-pharmacologic comfort measures Outcome: Progressing   Problem: Nutrition: Goal: Adequate nutrition will be maintained Outcome: Progressing   Problem: Coping: Goal: Level of anxiety will decrease Outcome: Progressing

## 2020-02-17 LAB — CBC
HCT: 24.3 % — ABNORMAL LOW (ref 39.0–52.0)
Hemoglobin: 7.8 g/dL — ABNORMAL LOW (ref 13.0–17.0)
MCH: 33.2 pg (ref 26.0–34.0)
MCHC: 32.1 g/dL (ref 30.0–36.0)
MCV: 103.4 fL — ABNORMAL HIGH (ref 80.0–100.0)
Platelets: 189 10*3/uL (ref 150–400)
RBC: 2.35 MIL/uL — ABNORMAL LOW (ref 4.22–5.81)
RDW: 13.4 % (ref 11.5–15.5)
WBC: 9.6 10*3/uL (ref 4.0–10.5)
nRBC: 0 % (ref 0.0–0.2)

## 2020-02-17 LAB — GLUCOSE, CAPILLARY
Glucose-Capillary: 188 mg/dL — ABNORMAL HIGH (ref 70–99)
Glucose-Capillary: 245 mg/dL — ABNORMAL HIGH (ref 70–99)
Glucose-Capillary: 166 mg/dL — ABNORMAL HIGH (ref 70–99)
Glucose-Capillary: 195 mg/dL — ABNORMAL HIGH (ref 70–99)

## 2020-02-17 LAB — HEMOGLOBIN AND HEMATOCRIT, BLOOD
HCT: 24.6 % — ABNORMAL LOW (ref 39.0–52.0)
HCT: 28.6 % — ABNORMAL LOW (ref 39.0–52.0)
Hemoglobin: 7.9 g/dL — ABNORMAL LOW (ref 13.0–17.0)
Hemoglobin: 9.1 g/dL — ABNORMAL LOW (ref 13.0–17.0)

## 2020-02-17 LAB — TYPE AND SCREEN
ABO/RH(D): O POS
Antibody Screen: NEGATIVE

## 2020-02-17 LAB — BASIC METABOLIC PANEL
Anion gap: 7 (ref 5–15)
BUN: 23 mg/dL (ref 8–23)
CO2: 24 mmol/L (ref 22–32)
Calcium: 8.4 mg/dL — ABNORMAL LOW (ref 8.9–10.3)
Chloride: 107 mmol/L (ref 98–111)
Creatinine, Ser: 1.82 mg/dL — ABNORMAL HIGH (ref 0.61–1.24)
GFR, Estimated: 37 mL/min — ABNORMAL LOW (ref >60)
Glucose, Bld: 181 mg/dL — ABNORMAL HIGH (ref 70–99)
Potassium: 4.3 mmol/L (ref 3.5–5.1)
Sodium: 138 mmol/L (ref 135–145)

## 2020-02-17 MED ORDER — SODIUM CHLORIDE 0.9% IV SOLUTION: Freq: Once | INTRAVENOUS | Status: AC

## 2020-02-17 MED ORDER — FERROUS SULFATE 325 (65 FE) MG PO TABS
325.0000 mg | ORAL_TABLET | Freq: Two times a day (BID) | ORAL | Status: DC
  Administered 2020-02-17 – 2020-02-20 (×6): 325 mg via ORAL
  Filled 2020-02-17 (×6): qty 1

## 2020-02-17 MED ORDER — ACETAMINOPHEN 325 MG PO TABS
325.0000 mg | ORAL_TABLET | Freq: Once | ORAL | Status: AC
  Administered 2020-02-17: 325 mg via ORAL
  Filled 2020-02-17: qty 1

## 2020-02-17 NOTE — Plan of Care (Signed)

## 2020-02-17 NOTE — Progress Notes (Signed)
Patient ID: Douglas Melton, male   DOB: 16-Feb-1936, 84 y.o.   MRN: [id number] Subjective: 2 Days Post-Op Procedure(s) (LRB): TOTAL HIP ARTHROPLASTY  ANTERIOR APPROACH (Left)    Patient reports pain as mild. He did report dizziness with attempted OOB activity yesterday.  Comfortable this am in bed  Objective:   VITALS:   Vitals:   02/16/20 1955 02/17/20 0300  BP: 104/61 (!) 109/51  Pulse: 94 98  Resp: 15 15  Temp: 99.6 F (37.6 C) 97.9 F (36.6 C)  SpO2: 96% 96%    Neurovascular intact Incision: dressing C/D/I  LABS Recent Labs    02/15/20 0300 02/15/20 0300 02/16/20 0149 02/16/20 1029 02/17/20 0238  HGB 11.5*   < > 8.9* 9.1* 7.8*  HCT 36.1*   < > 27.8* 28.9* 24.3*  WBC 12.9*  --  15.3*  --  9.6  PLT 264  --  225  --  189   < > = values in this interval not displayed.    Recent Labs    02/15/20 0300 02/15/20 0300 02/16/20 0149 02/16/20 1029 02/17/20 0238  NA 139  --  139  --  138  K 4.2   < > 5.2* 4.4 4.3  BUN 16  --  17  --  23  CREATININE 1.38*  --  1.65*  --  1.82*  GLUCOSE 161*  --  189*  --  181*   < > = values in this interval not displayed.    Recent Labs    02/14/20 1631  INR 1.1     Assessment/Plan: 2 Days Post-Op Procedure(s) (LRB): TOTAL HIP ARTHROPLASTY  ANTERIOR APPROACH (Left)   Advance diet Up with therapy to assess functional level and dizziness  ABLA Continue hydration FeSO4 supplement At 7.8 Hgb can continue to observe, consider transfusion if Hgb fall below 7 or above efforts fail to improve functional capabilities

## 2020-02-17 NOTE — Progress Notes (Signed)
250cc bolus given prior to PT session, orthostatic hypotension noted during session patient unable to get OOB d/t findings. MD notified. Will continue to monitor patient.

## 2020-02-17 NOTE — Progress Notes (Signed)
PROGRESS NOTE    Douglas Melton  VQQ:[id number] DOB: Feb 24, 1936 DOA: 02/14/2020 PCP: [name], MD   Brief Narrative: 84 years old male with medical history significant for chronic anemia, diabetes, hypertension, CKD stage III, hypothyroidism, blindness presents s/p fall at home.  He denies any LOC or head injury.  He is found to have left femoral neck fracture on x-ray.  He is also found to have prolonged QTC which resolved.  Orthopedics consulted,  Patient underwent left hip arthroplasty 10/22, tolerated well. He is doing much better, PT recommended home with Home PT.  Assessment & Plan:   Principal Problem:   Left displaced femoral neck fracture (HCC) Active Problems:   Diabetes mellitus without complication ([place])   Hypertension associated with diabetes ([place])   CKD (chronic kidney disease), stage III (HCC)   Hypothyroidism   Left displaced femoral neck fracture. - Orthopedics consulted in ED,  underwent Left total hip arthroplasty 02/15/20. - Pain control with Norco every 6 hours as needed for moderate pain and morphine 0.2 mg every 4 hours as needed for severe pain - One-time dose of Compazine for nausea. - SCDs - Patient participated in physical therapy but felt weak.  Prolonged QT > Improved - QTC elevated to 539 - Avoid QTC prolonging medications - Monitor on telemetry - Did receive one-time dose of Compazine for nausea. - Repeat EKG QTC 441  CKD 3 - Creatinine at baseline of 1.5  - Avid nephrotoxic medications.  Hypothyroidism - Continue home Synthroid  Diabetes - Takes Metformin at home. - Sliding scale insulin here.  Hypertension -Continue home lisinopril  DVT prophylaxis: SCDS Code Status: Full Family Communication:  No one at bed side. Disposition Plan:  Status is: Inpatient  Remains inpatient appropriate because:Inpatient level of care appropriate due to severity of illness   Dispo: The patient is from: Home              Anticipated  d/c is to: SNF vs Home with home services.              Anticipated d/c date is: 2 days              Patient currently is not medically stable to d/c.   Consultants:   orthopeadics  Procedures:  Left THA  Antimicrobials:  Anti-infectives (From admission, onward)   Start     Dose/Rate Route Frequency Ordered Stop   02/16/20 0600  ceFAZolin (ANCEF) IVPB 2g/100 mL premix        2 g 200 mL/hr over 30 Minutes Intravenous On call to O.R. 02/15/20 1428 02/15/20 1608   02/15/20 2200  ceFAZolin (ANCEF) IVPB 2g/100 mL premix        2 g 200 mL/hr over 30 Minutes Intravenous Every 6 hours 02/15/20 2010 02/16/20 0454   02/15/20 1430  ceFAZolin (ANCEF) 2-4 GM/100ML-% IVPB       Note to Pharmacy: Ladoris Gene   : cabinet override      02/15/20 1430 02/15/20 1638      Subjective: Patient is seen and examined at bedside.  Overnight events noted.  Patient reports feeling better, reports dizziness, Hb dropped and getting blood transfusion.   Objective: Vitals:   02/17/20 0820 02/17/20 1118 02/17/20 1123 02/17/20 1137  BP: 129/63 136/68 138/70 (!) 131/58  Pulse: 100 (!) 107 99 100  Resp: 18 18 18 19   Temp: 100.2 F (37.9 C) 98.8 F (37.1 C) 98.1 F (36.7 C) 98.9 F (37.2 C)  TempSrc: Oral Oral  Oral Oral  SpO2: 94% 93% 94% 96%  Weight:      Height:        Intake/Output Summary (Last 24 hours) at 02/17/2020 1347 Last data filed at 02/17/2020 0900 Gross per 24 hour  Intake 480 ml  Output 400 ml  Net 80 ml   Filed Weights   02/15/20 2021  Weight: 68 kg    Examination:  General exam: Appears calm and comfortable. Respiratory system: Clear to auscultation. Respiratory effort normal. Cardiovascular system: S1 & S2 heard, RRR. No JVD, murmurs, rubs, gallops or clicks. No pedal edema. Gastrointestinal system: Abdomen is nondistended, soft and nontender. No organomegaly or masses felt. Normal bowel sounds heard. Central nervous system: Alert and oriented. No focal neurological  deficits. Extremities: Left hip tenderness noted. ROM: restricted. Skin: No rashes, lesions or ulcers Psychiatry: Judgement and insight appear normal. Mood & affect appropriate.     Data Reviewed: I have personally reviewed following labs and imaging studies  CBC: Recent Labs  Lab 02/14/20 1631 02/14/20 1631 02/15/20 0300 02/16/20 0149 02/16/20 1029 02/17/20 0238 02/17/20 0844  WBC 7.5  --  12.9* 15.3*  --  9.6  --   NEUTROABS 4.6  --   --   --   --   --   --   HGB 12.1*   < > 11.5* 8.9* 9.1* 7.8* 7.9*  HCT 38.1*   < > 36.1* 27.8* 28.9* 24.3* 24.6*  MCV 104.1*  --  101.4* 103.0*  --  103.4*  --   PLT 287  --  264 225  --  189  --    < > = values in this interval not displayed.   Basic Metabolic Panel: Recent Labs  Lab 02/14/20 1631 02/15/20 0300 02/16/20 0149 02/16/20 1029 02/17/20 0238  NA 139 139 139  --  138  K 4.6 4.2 5.2* 4.4 4.3  CL 110 108 107  --  107  CO2 19* 23 26  --  24  GLUCOSE 182* 161* 189*  --  181*  BUN 18 16 17   --  23  CREATININE 1.55* 1.38* 1.65*  --  1.82*  CALCIUM 9.0 9.0 8.5*  --  8.4*   GFR: Estimated Creatinine Clearance: 30.1 mL/min (A) (by C-G formula based on SCr of 1.82 mg/dL (H)). Liver Function Tests: Recent Labs  Lab 02/14/20 1631  AST 15  ALT 12  ALKPHOS 89  BILITOT 0.6  PROT 6.3*  ALBUMIN 3.5   No results for input(s): LIPASE, AMYLASE in the last 168 hours. No results for input(s): AMMONIA in the last 168 hours. Coagulation Profile: Recent Labs  Lab 02/14/20 1631  INR 1.1   Cardiac Enzymes: No results for input(s): CKTOTAL, CKMB, CKMBINDEX, TROPONINI in the last 168 hours. BNP (last 3 results) No results for input(s): PROBNP in the last 8760 hours. HbA1C: Recent Labs    02/14/20 2234  HGBA1C 5.7*   CBG: Recent Labs  Lab 02/16/20 1142 02/16/20 1708 02/16/20 2049 02/17/20 0640 02/17/20 1207  GLUCAP 162* 188* 210* 188* 245*   Lipid Profile: No results for input(s): CHOL, HDL, LDLCALC, TRIG, CHOLHDL,  LDLDIRECT in the last 72 hours. Thyroid Function Tests: No results for input(s): TSH, T4TOTAL, FREET4, T3FREE, THYROIDAB in the last 72 hours. Anemia Panel: No results for input(s): VITAMINB12, FOLATE, FERRITIN, TIBC, IRON, RETICCTPCT in the last 72 hours. Sepsis Labs: No results for input(s): PROCALCITON, LATICACIDVEN in the last 168 hours.  Recent Results (from the past 240 hour(s))  Respiratory  Panel by RT PCR (Flu A&B, Covid) - Nasopharyngeal Swab     Status: None   Collection Time: 02/14/20  4:32 PM   Specimen: Nasopharyngeal Swab  Result Value Ref Range Status   SARS Coronavirus 2 by RT PCR NEGATIVE NEGATIVE Final    Comment: (NOTE) SARS-CoV-2 target nucleic acids are NOT DETECTED.  The SARS-CoV-2 RNA is generally detectable in upper respiratoy specimens during the acute phase of infection. The lowest concentration of SARS-CoV-2 viral copies this assay can detect is 131 copies/mL. A negative result does not preclude SARS-Cov-2 infection and should not be used as the sole basis for treatment or other patient management decisions. A negative result may occur with  improper specimen collection/handling, submission of specimen other than nasopharyngeal swab, presence of viral mutation(s) within the areas targeted by this assay, and inadequate number of viral copies (<131 copies/mL). A negative result must be combined with clinical observations, patient history, and epidemiological information. The expected result is Negative.  Fact Sheet for Patients:  PinkCheek.be  Fact Sheet for Healthcare Providers:  GravelBags.it  This test is no t yet approved or cleared by the [place] FDA and  has been authorized for detection and/or diagnosis of SARS-CoV-2 by FDA under an Emergency Use Authorization (EUA). This EUA will remain  in effect (meaning this test can be used) for the duration of the COVID-19 declaration under  Section 564(b)(1) of the Act, 21 U.S.C. section 360bbb-3(b)(1), unless the authorization is terminated or revoked sooner.     Influenza A by PCR NEGATIVE NEGATIVE Final   Influenza B by PCR NEGATIVE NEGATIVE Final    Comment: (NOTE) The Xpert Xpress SARS-CoV-2/FLU/RSV assay is intended as an aid in  the diagnosis of influenza from Nasopharyngeal swab specimens and  should not be used as a sole basis for treatment. Nasal washings and  aspirates are unacceptable for Xpert Xpress SARS-CoV-2/FLU/RSV  testing.  Fact Sheet for Patients: PinkCheek.be  Fact Sheet for Healthcare Providers: GravelBags.it  This test is not yet approved or cleared by the [place] FDA and  has been authorized for detection and/or diagnosis of SARS-CoV-2 by  FDA under an Emergency Use Authorization (EUA). This EUA will remain  in effect (meaning this test can be used) for the duration of the  Covid-19 declaration under Section 564(b)(1) of the Act, 21  U.S.C. section 360bbb-3(b)(1), unless the authorization is  terminated or revoked. Performed at [facility], [place] [address] [place], [place] [address]     Radiology Studies: Pelvis Portable  Result Date: 02/15/2020 CLINICAL DATA:  Postsurgical changes EXAM: PORTABLE PELVIS 1-2 VIEWS COMPARISON:  Radiograph same day FINDINGS: The patient is status post left total hip arthroplasty without without a periprosthetic fracture. Is there is a probable chronic appearing inferior pubic rami fracture on the left. Overlying subcutaneous emphysema is seen. IMPRESSION: Status post left total hip arthroplasty without acute complication. Electronically Signed   By: Prudencio Pair M.D.   On: 02/15/2020 19:06   DG C-Arm 1-60 Min  Result Date: 02/15/2020 CLINICAL DATA:  Left hip replacement EXAM: OPERATIVE LEFT HIP (WITH PELVIS IF PERFORMED) 4 VIEWS TECHNIQUE: Fluoroscopic spot image(s) were submitted for  interpretation post-operatively. COMPARISON:  02/14/2020 FINDINGS: Multiple intraoperative spot hip replacement images demonstrate. Normal AP changes of left alignment. No visible hardware or bony complicating feature. IMPRESSION: Left hip replacement.  No visible complicating feature. Electronically Signed   By: Rolm Baptise M.D.   On: 02/15/2020 17:46   DG HIP OPERATIVE UNILAT  W OR W/O PELVIS LEFT  Result Date: 02/15/2020 CLINICAL DATA:  Left hip replacement EXAM: OPERATIVE LEFT HIP (WITH PELVIS IF PERFORMED) 4 VIEWS TECHNIQUE: Fluoroscopic spot image(s) were submitted for interpretation post-operatively. COMPARISON:  02/14/2020 FINDINGS: Multiple intraoperative spot hip replacement images demonstrate. Normal AP changes of left alignment. No visible hardware or bony complicating feature. IMPRESSION: Left hip replacement.  No visible complicating feature. Electronically Signed   By: Rolm Baptise M.D.   On: 02/15/2020 17:46   Scheduled Meds: . aspirin EC  325 mg Oral Q breakfast  . docusate sodium  100 mg Oral BID  . dorzolamide-timolol  1 drop Left Eye BID  . ferrous sulfate  325 mg Oral BID WC  . insulin aspart  0-9 Units Subcutaneous TID WC  . levothyroxine  88 mcg Oral QAC breakfast  . lisinopril  5 mg Oral Daily  . multivitamin with minerals  1 tablet Oral Daily   Continuous Infusions: . methocarbamol (ROBAXIN) IV       LOS: 3 days    Time spent: 25 mins    Shawna Clamp, MD [facility]   If 7PM-7AM, please contact night-coverage

## 2020-02-17 NOTE — Progress Notes (Signed)
This note also relates to the following rows which could not be included: ECG Heart Rate - Cannot attach notes to unvalidated device data    02/17/20 1400  Assess: MEWS Score  Temp 99.4 F (37.4 C)  BP 138/69  Pulse Rate 64  Resp 18  Level of Consciousness Alert  SpO2 95 %  O2 Device Room Air  Patient Activity (if Appropriate) In bed  Assess: MEWS Score  MEWS Temp 0  MEWS Systolic 0  MEWS Pulse 0  MEWS RR 0  MEWS LOC 0  MEWS Score 0  MEWS Score Color Green  Treat  Pain Scale 0-10  Pain Score 3  Pain Type Surgical pain  Pain Location Leg  Pain Orientation Left  Pain Descriptors / Indicators Aching  Take Vital Signs  Increase Vital Sign Frequency  Yellow: Q 2hr X 2 then Q 4hr X 2, if remains yellow, continue Q 4hrs  Escalate  MEWS: Escalate Yellow: discuss with charge nurse/RN and consider discussing with provider and RRT  Notify: Charge Nurse/RN  Name of Charge Nurse/RN Notified Leonore, RN  Date Charge Nurse/RN Notified 02/17/20  Time Charge Nurse/RN Notified 1400  Notify: Provider  Provider Name/Title Shawna Clamp, MD  Date Provider Notified 02/17/20  Time Provider Notified 1400  Notification Type Page  Notification Reason Change in status  Response See new orders  Date of Provider Response 02/17/20  Time of Provider Response 1410

## 2020-02-17 NOTE — Progress Notes (Signed)
1 unit PRBC transfusing, no adverse reaction noted post 65mins. Patient denied complaints. Will continue to monitor.

## 2020-02-17 NOTE — Progress Notes (Signed)
Physical Therapy Treatment Patient Details Name: Douglas Melton MRN: [id number] DOB: [birth year] Today's Date: 02/17/2020    History of Present Illness Douglas Melton is a 84 y.o. male with medical history significant of anemia, diabetes, hypertension, CKD 3, hypothyroidism, blindness who presents following a fall at home.  Patient was attempting to move a drawer anterior chest of drawers when he tripped over a table    PT Comments    Continuing work on functional mobility and activity tolerance;  Session focused on obtaining Orthostatic BPs, and continuing assessment of activity tolerance; Significant BP drop in standing, coupled with pt reporting the feeling like he will pass out (see BPs in chart below and in Doc Flowsheets); Leighton Roach, RN aware;  He is seemingly weaker today, and wife is concerned about her ability to take care of him at home; Will update dc plan to SNF for post-acute rehab to maximize independence and safety with mobility, activity tolerance, and ADLs prior to DC home   Follow Up Recommendations  SNF;Other (comment) (pt and wife indicated they like [facility] )     Clinical biochemist with 5" wheels;3in1 (PT)    Recommendations for Other Services       Precautions / Restrictions Precautions Precautions: Fall Precaution Comments: patient is blind Restrictions LLE Weight Bearing: Weight bearing as tolerated    Mobility  Bed Mobility Overal bed mobility: Needs Assistance Bed Mobility: Supine to Sit;Sit to Supine     Supine to sit: Mod assist Sit to supine: Mod assist   General bed mobility comments: Initiated pusing up to sit well, Mod assist and use of bed pad to square off hips at EOB; Fatigued after EOB activity and required mod assist to help LEs into bed  Transfers Overall transfer level: Needs assistance Equipment used: Rolling walker (2 wheeled) Transfers: Sit to/from Stand Sit to Stand: Min assist;Mod assist          General transfer comment: Min assist to stand initially, with cues for hand placement and safety; Became lightheaded again, very similar to last PT session; captured standing BP, and noted a significant drop compared to sitting; Second sit to stand requiring Mod assist to power up to stand  Ambulation/Gait Ambulation/Gait assistance: Mod assist;+2 safety/equipment Gait Distance (Feet):  (sidesteps to [facility]) Assistive device: Rolling walker (2 wheeled)       General Gait Details: Briefly stood and took steps to [facility]; short steps and dependent on RW for steadiness; lightheaded with upright activity again   Stairs             Wheelchair Mobility    Modified Rankin (Stroke Patients Only)       Balance     Sitting balance-Leahy Scale: Fair       Standing balance-Leahy Scale: Poor                              Cognition Arousal/Alertness: Awake/alert Behavior During Therapy: WFL for tasks assessed/performed Overall Cognitive Status: Within Functional Limits for tasks assessed                                        Exercises Total Joint Exercises Ankle Circles/Pumps: AROM;Both;5 reps Quad Sets: AROM;Left;5 reps Heel Slides: AAROM;Left (3 reps)    General Comments General comments (skin integrity, edema, etc.):   02/17/20 1602  Vital Signs  Patient Position (if appropriate) Orthostatic Vitals  Orthostatic Lying   BP- Lying 132/56  Pulse- Lying 100  Orthostatic Sitting  BP- Sitting 138/58  Pulse- Sitting 90  Orthostatic Standing at 0 minutes  BP- Standing at 0 minutes (!) 73/55 (symptomatic for lightheadedness)  Pulse- Standing at 0 minutes 114         Pertinent Vitals/Pain Pain Assessment: No/denies pain Faces Pain Scale: Hurts a little bit Pain Location: L hip Pain Descriptors / Indicators: Tightness Pain Intervention(s): Monitored during session    Home Living                      Prior Function             PT Goals (current goals can now be found in the care plan section) Acute Rehab PT Goals Patient Stated Goal: to hopefully return home if he can get around PT Goal Formulation: With patient/family Time For Goal Achievement: 02/29/20 Potential to Achieve Goals: Fair Progress towards PT goals: Not progressing toward goals - comment (limited by orthostatic hypotension)    Frequency    Min 4X/week      PT Plan Discharge plan needs to be updated;Frequency needs to be updated (We must consider post-acute rehab)    Co-evaluation              AM-PAC PT "6 Clicks" Mobility   Outcome Measure  Help needed turning from your back to your side while in a flat bed without using bedrails?: A Little Help needed moving from lying on your back to sitting on the side of a flat bed without using bedrails?: A Lot Help needed moving to and from a bed to a chair (including a wheelchair)?: A Lot Help needed standing up from a chair using your arms (e.g., wheelchair or bedside chair)?: A Lot Help needed to walk in hospital room?: A Lot Help needed climbing 3-5 steps with a railing? : Total 6 Click Score: 12    End of Session Equipment Utilized During Treatment: Gait belt Activity Tolerance: Treatment limited secondary to medical complications (Comment) (Orthostatic Hypotension) Patient left: in bed;with call bell/phone within reach;with bed alarm set (bed in semi-chair position) Nurse Communication: Mobility status (BP drop in standing) PT Visit Diagnosis: Muscle weakness (generalized) (M62.81);Difficulty in walking, not elsewhere classified (R26.2);History of falling (Z91.81);Other abnormalities of gait and mobility (R26.89)     Time: 1628-1720 (minus apporx 10 minutes for student experience) PT Time Calculation (min) (ACUTE ONLY): 52 min  Charges:  $Therapeutic Activity: 38-52 mins                     Roney Marion, PT  Acute Rehabilitation Services Pager [phone] Office  [place] 02/17/2020, 5:53 PM

## 2020-02-18 LAB — CBC
HCT: 24.6 % — ABNORMAL LOW (ref 39.0–52.0)
Hemoglobin: 8 g/dL — ABNORMAL LOW (ref 13.0–17.0)
MCH: 32.1 pg (ref 26.0–34.0)
MCHC: 32.5 g/dL (ref 30.0–36.0)
MCV: 98.8 fL (ref 80.0–100.0)
Platelets: 153 10*3/uL (ref 150–400)
RBC: 2.49 MIL/uL — ABNORMAL LOW (ref 4.22–5.81)
RDW: 14.6 % (ref 11.5–15.5)
WBC: 11.7 10*3/uL — ABNORMAL HIGH (ref 4.0–10.5)
nRBC: 0 % (ref 0.0–0.2)

## 2020-02-18 LAB — BASIC METABOLIC PANEL
Anion gap: 7 (ref 5–15)
BUN: 29 mg/dL — ABNORMAL HIGH (ref 8–23)
CO2: 24 mmol/L (ref 22–32)
Calcium: 8.4 mg/dL — ABNORMAL LOW (ref 8.9–10.3)
Chloride: 107 mmol/L (ref 98–111)
Creatinine, Ser: 1.69 mg/dL — ABNORMAL HIGH (ref 0.61–1.24)
GFR, Estimated: 40 mL/min — ABNORMAL LOW (ref >60)
Glucose, Bld: 164 mg/dL — ABNORMAL HIGH (ref 70–99)
Potassium: 4.3 mmol/L (ref 3.5–5.1)
Sodium: 138 mmol/L (ref 135–145)

## 2020-02-18 LAB — TYPE AND SCREEN
ABO/RH(D): O POS
Antibody Screen: NEGATIVE
Unit division: 0

## 2020-02-18 LAB — HEMOGLOBIN AND HEMATOCRIT, BLOOD
HCT: 25.6 % — ABNORMAL LOW (ref 39.0–52.0)
Hemoglobin: 8.2 g/dL — ABNORMAL LOW (ref 13.0–17.0)

## 2020-02-18 LAB — BPAM RBC
Blood Product Expiration Date: 202111232359
ISSUE DATE / TIME: 202110241109
Unit Type and Rh: 5100

## 2020-02-18 LAB — GLUCOSE, CAPILLARY
Glucose-Capillary: 128 mg/dL — ABNORMAL HIGH (ref 70–99)
Glucose-Capillary: 162 mg/dL — ABNORMAL HIGH (ref 70–99)
Glucose-Capillary: 134 mg/dL — ABNORMAL HIGH (ref 70–99)
Glucose-Capillary: 208 mg/dL — ABNORMAL HIGH (ref 70–99)

## 2020-02-18 LAB — PHOSPHORUS: Phosphorus: 2.8 mg/dL (ref 2.5–4.6)

## 2020-02-18 LAB — MAGNESIUM: Magnesium: 1.8 mg/dL (ref 1.7–2.4)

## 2020-02-18 MED ORDER — HYDROCODONE-ACETAMINOPHEN 5-325 MG PO TABS
1.0000 | ORAL_TABLET | Freq: Four times a day (QID) | ORAL | 0 refills | Status: DC | PRN
Start: 2020-02-18 — End: 2023-03-01

## 2020-02-18 MED ORDER — ASPIRIN 81 MG PO CHEW: 81.0000 mg | CHEWABLE_TABLET | Freq: Two times a day (BID) | ORAL | 0 refills | Status: AC

## 2020-02-18 NOTE — Care Management Important Message (Signed)
Important Message  Patient Details  Name: Douglas Melton MRN: [id number] Date of Birth: 12/11/1935   Medicare Important Message Given:  Yes     [name] 02/18/2020, 3:38 PM

## 2020-02-18 NOTE — Progress Notes (Signed)
Patient ID: Douglas Melton, male   DOB: 05-Jul-1935, 84 y.o.   MRN: [id number] Subjective: 3 Days Post-Op Procedure(s) (LRB): TOTAL HIP ARTHROPLASTY  ANTERIOR APPROACH (Left)    Patient reports pain as mild. He did report dizziness with PT again yesterday.  Comfortable this am in bed  Objective:   VITALS:   Vitals:   02/17/20 2200 02/18/20 0200  BP: 111/65 113/67  Pulse: 90 91  Resp: 16 16  Temp: 99.1 F (37.3 C) 98.8 F (37.1 C)  SpO2: 97% 96%    Neurovascular intact Sensation intact distally Dorsiflexion/Plantar flexion intact Incision: dressing C/D/I  LABS Recent Labs    02/16/20 0149 02/16/20 1029 02/17/20 0238 02/17/20 0238 02/17/20 0844 02/17/20 1529 02/18/20 0027  HGB 8.9*   < > 7.8*   < > 7.9* 9.1* 8.0*  HCT 27.8*   < > 24.3*   < > 24.6* 28.6* 24.6*  WBC 15.3*  --  9.6  --   --   --  11.7*  PLT 225  --  189  --   --   --  153   < > = values in this interval not displayed.    Recent Labs    02/16/20 0149 02/16/20 0149 02/16/20 1029 02/17/20 0238 02/18/20 0027  NA 139  --   --  138 138  K 5.2*   < > 4.4 4.3 4.3  BUN 17  --   --  23 29*  CREATININE 1.65*  --   --  1.82* 1.69*  GLUCOSE 189*  --   --  181* 164*   < > = values in this interval not displayed.    No results for input(s): LABPT, INR in the last 72 hours.   Assessment/Plan: 3 Days Post-Op Procedure(s) (LRB): TOTAL HIP ARTHROPLASTY  ANTERIOR APPROACH (Left)   Advance diet Up with therapy to assess functional level and dizziness  ABLA- Treat per hospitalist.  PRBC transfused yesterday WBAT w/ walker D/C planning SNF/home.  Rx on chart Dorothyann Peng, [place]

## 2020-02-18 NOTE — Plan of Care (Signed)
  Problem: Education: Goal: Verbalization of understanding the information provided (i.e., activity precautions, restrictions, etc) will improve Outcome: Progressing Goal: Individualized Educational Video(s) Outcome: Progressing   Problem: Education: Goal: Individualized Educational Video(s) Outcome: Progressing   Problem: Activity: Goal: Ability to ambulate and perform ADLs will improve Outcome: Progressing   Problem: Clinical Measurements: Goal: Postoperative complications will be avoided or minimized Outcome: Progressing   Problem: Self-Concept: Goal: Ability to maintain and perform role responsibilities to the fullest extent possible will improve Outcome: Progressing   Problem: Pain Management: Goal: Pain level will decrease Outcome: Progressing   Problem: Education: Goal: Knowledge of General Education information will improve Description: Including pain rating scale, medication(s)/side effects and non-pharmacologic comfort measures Outcome: Progressing

## 2020-02-18 NOTE — Plan of Care (Signed)

## 2020-02-18 NOTE — Progress Notes (Signed)
Physical Therapy Treatment Patient Details Name: Douglas Melton MRN: [id number] DOB: November 11, 1935 Today's Date: 02/18/2020    History of Present Illness Douglas Melton is a 84 y.o. male with medical history significant of anemia, diabetes, hypertension, CKD 3, hypothyroidism, blindness who presents following a fall at home.  Patient was attempting to move a drawer anterior chest of drawers when he tripped over a table    PT Comments    Continuing work on functional mobility and activity tolerance;  Pt reports feeling a bit better today, and we did observe mild improvements in standing time -- upon initial stand, he was able to tolerate upright standing 63s before reporting he needed to sit back down, compared to 30s last session; He still had a BP drop in standing, though not as low as yesterday (see below); Session focused on therapeutic exercises aimed at ranging the L hip and facilitating muscle control around the hip joint; We also prioritized helping him OOB to recliner via a basic pivot transfer   Follow Up Recommendations  SNF     Equipment Recommendations  Rolling walker with 5" wheels;3in1 (PT) (may need WC if orthostasis persists)    Recommendations for Other Services       Precautions / Restrictions Precautions Precautions: Fall Photographer) Precaution Comments: patient is blind Restrictions LLE Weight Bearing: Weight bearing as tolerated    Mobility  Bed Mobility Overal bed mobility: Needs Assistance Bed Mobility: Supine to Sit     Supine to sit: Mod assist     General bed mobility comments: Initiated pusing up to sit well, light Mod assist and use of bed pad to square off hips at EOB  Transfers Overall transfer level: Needs assistance Equipment used: 1 person hand held assist;Rolling walker (2 wheeled) Transfers: Risk manager;Sit to/from Stand Sit to Stand: Mod assist Stand pivot transfers: Mod assist       General transfer comment:  Started with a basic pivot transfer OOB to recliner on his R side with mod assist to power up and over, and multimodal cues for hand placement; From the recliner, worked on sit to stand with heavy cues for hand placement and mod assist to power up with bilateral support; near constant cues to self-monitor for activity tolerance  Ambulation/Gait             General Gait Details: Still with pre-syncopal feeling with incr time in standing which precluded walking today   Stairs             Wheelchair Mobility    Modified Rankin (Stroke Patients Only)       Balance     Sitting balance-Leahy Scale: Fair       Standing balance-Leahy Scale: Poor                              Cognition Arousal/Alertness: Awake/alert Behavior During Therapy: WFL for tasks assessed/performed Overall Cognitive Status: Within Functional Limits for tasks assessed                                 General Comments: Seems he does not entirely understand how/why he is not safe to walk with the standing dizziness;  very focused on walking      Exercises Total Joint Exercises Gluteal Sets: AROM;Both;10 reps Heel Slides: AAROM;Left;10 reps (self AAROM with belt) Hip ABduction/ADduction: AAROM;Left;10 reps Bridges: AROM;Both;10 reps (Bolstered  under LLE)    General Comments General comments (skin integrity, edema, etc.):   02/18/20 1746 02/18/20 1747  Orthostatic Lying   BP- Lying 119/58  --   Pulse- Lying 96  --   Orthostatic Sitting  BP- Sitting 110/80 108/72 (MAP 81)  Pulse- Sitting 104 99  Orthostatic Standing at 0 minutes  BP- Standing at 0 minutes (!) 86/51 (MAP 63) (!) 82/60 (MAP 104)  Pulse- Standing at 0 minutes 112 104         Pertinent Vitals/Pain Pain Assessment: No/denies pain Faces Pain Scale: Hurts a little bit Pain Location: L hip; noting grimacing, however pt reports it is related to effort, not pain Pain Descriptors / Indicators:  Tightness Pain Intervention(s): Monitored during session    Home Living                      Prior Function            PT Goals (current goals can now be found in the care plan section) Acute Rehab PT Goals Patient Stated Goal: to hopefully return home if he can get around PT Goal Formulation: With patient/family Time For Goal Achievement: 02/29/20 Potential to Achieve Goals: Fair Progress towards PT goals: Progressing toward goals (slowly)    Frequency    Min 3X/week      PT Plan Current plan remains appropriate;Frequency needs to be updated    Co-evaluation              AM-PAC PT "6 Clicks" Mobility   Outcome Measure  Help needed turning from your back to your side while in a flat bed without using bedrails?: A Little Help needed moving from lying on your back to sitting on the side of a flat bed without using bedrails?: A Lot Help needed moving to and from a bed to a chair (including a wheelchair)?: A Lot Help needed standing up from a chair using your arms (e.g., wheelchair or bedside chair)?: A Lot Help needed to walk in hospital room?: A Lot Help needed climbing 3-5 steps with a railing? : Total 6 Click Score: 12    End of Session Equipment Utilized During Treatment: Gait belt Activity Tolerance: Treatment limited secondary to medical complications (Comment) (Orthostatic Hypotension) Patient left: in chair;with call bell/phone within reach;with family/visitor present (semi-reclined) Nurse Communication: Mobility status (BP drop in standing) PT Visit Diagnosis: Muscle weakness (generalized) (M62.81);Difficulty in walking, not elsewhere classified (R26.2);History of falling (Z91.81);Other abnormalities of gait and mobility (R26.89)     Time: 5427-0623 PT Time Calculation (min) (ACUTE ONLY): 51 min  Charges:  $Therapeutic Exercise: 8-22 mins $Therapeutic Activity: 23-37 mins                     Roney Marion, PT  Acute Rehabilitation  Services Pager [phone] Office [phone]    Colletta [place] 02/18/2020, 10:10 PM

## 2020-02-18 NOTE — TOC CAGE-AID Note (Signed)
Transition of Care [facility]) - CAGE-AID Screening   Patient Details  Name: Douglas Melton MRN: [id number] Date of Birth: 1935/12/06  Transition of Care [facility]) CM/SW Contact:    Emeterio Reeve, [place] Phone Number: 02/18/2020, 12:36 PM   Clinical Narrative:  CSW met with pt at bedside. CSW introduced self and explained role at the hospital.  Pt denies alcohol use. Pt denies substance use. Pt did not need any resources at this time.    CAGE-AID Screening:    Have You Ever Felt You Ought to Cut Down on Your Drinking or Drug Use?: No Have People Annoyed You By Critizing Your Drinking Or Drug Use?: No Have You Felt Bad Or Guilty About Your Drinking Or Drug Use?: No Have You Ever Had a Drink or Used Drugs First Thing In The Morning to Steady Your Nerves or to Get Rid of a Hangover?: No CAGE-AID Score: 0  Substance Abuse Education Offered: Yes      Blima Ledger, [place] Social Worker [phone]

## 2020-02-18 NOTE — Progress Notes (Signed)
PROGRESS NOTE    Douglas Melton  GDJ:[id number] DOB: Nov 20, 1935 DOA: 02/14/2020 PCP: [name], MD   Brief Narrative: 84 years old male with medical history significant for chronic anemia, diabetes, hypertension, CKD stage III, hypothyroidism, blindness presents s/p fall at home.  He denies any LOC or head injury.  He is found to have left femoral neck fracture on x-ray.  He is also found to have prolonged QTC which resolved.  Orthopedics consulted,  Patient underwent left hip arthroplasty 10/22, tolerated well. He is doing much better, PT recommended SNF given unsteadiness.   Assessment & Plan:   Principal Problem:   Left displaced femoral neck fracture (HCC) Active Problems:   Diabetes mellitus without complication ([place])   Hypertension associated with diabetes ([place])   CKD (chronic kidney disease), stage III (HCC)   Hypothyroidism   Left displaced femoral neck fracture.  - Orthopedics consulted in ED,  underwent Left total hip arthroplasty 02/15/20. - Pain control with Norco every 6 hours as needed for moderate pain and morphine 0.2 mg every 4 hours as needed for severe pain - zofran as needed for Nausea and vomiting. - SCDs - Patient participated in physical therapy but felt weak. - PT recommended SNF.  Prolonged QT > Improved - QTC elevated to 539 - Avoid QTC prolonging medications - Monitor on telemetry - Did receive one-time dose of Compazine for nausea. - Repeat EKG QTC 441  CKD 3a - Creatinine at baseline of 1.5  - Avid nephrotoxic medications.  Hypothyroidism - Continue home Synthroid  Diabetes - Takes Metformin at home. - Sliding scale insulin here.  Hypertension -Continue home lisinopril  DVT prophylaxis: SCDS Code Status: Full Family Communication:  No one at bed side. Disposition Plan:  Status is: Inpatient  Remains inpatient appropriate because:Inpatient level of care appropriate due to severity of illness   Dispo: The patient is  from: Home              Anticipated d/c is to: SNF              Anticipated d/c date is: 10/25              Patient currently is not medically stable to d/c.   Consultants:   orthopeadics  Procedures:  Left THA  Antimicrobials:  Anti-infectives (From admission, onward)   Start     Dose/Rate Route Frequency Ordered Stop   02/16/20 0600  ceFAZolin (ANCEF) IVPB 2g/100 mL premix        2 g 200 mL/hr over 30 Minutes Intravenous On call to O.R. 02/15/20 1428 02/15/20 1608   02/15/20 2200  ceFAZolin (ANCEF) IVPB 2g/100 mL premix        2 g 200 mL/hr over 30 Minutes Intravenous Every 6 hours 02/15/20 2010 02/16/20 0454   02/15/20 1430  ceFAZolin (ANCEF) 2-4 GM/100ML-% IVPB       Note to Pharmacy: Ladoris Gene   : cabinet override      02/15/20 1430 02/15/20 1638      Subjective: Patient is seen and examined at bedside.  Overnight events noted.   Patient reports feeling much better, still reports dizziness. He reports pain is better controlled.   Objective: Vitals:   02/17/20 2200 02/18/20 0200 02/18/20 0815 02/18/20 1004  BP: 111/65 113/67 128/63 128/63  Pulse: 90 91 (!) 103   Resp: 16 16 20    Temp: 99.1 F (37.3 C) 98.8 F (37.1 C) 98.5 F (36.9 C)   TempSrc: Axillary Axillary Oral  SpO2: 97% 96% 95%   Weight:      Height:        Intake/Output Summary (Last 24 hours) at 02/18/2020 1527 Last data filed at 02/18/2020 0900 Gross per 24 hour  Intake 480 ml  Output 250 ml  Net 230 ml   Filed Weights   02/15/20 2021  Weight: 68 kg    Examination:  General exam: Appears calm and comfortable. Respiratory system: Clear to auscultation. Respiratory effort normal. Cardiovascular system: S1 & S2 heard, RRR. No JVD, murmurs, rubs, gallops or clicks. No pedal edema. Gastrointestinal system: Abdomen is nondistended, soft and nontender. No organomegaly or masses felt. Normal bowel sounds heard. Central nervous system: Alert and oriented. No focal neurological  deficits. Extremities: Left thigh tenderness noted. ROM: restricted. Skin: No rashes, lesions or ulcers Psychiatry: Judgement and insight appear normal. Mood & affect appropriate.     Data Reviewed: I have personally reviewed following labs and imaging studies  CBC: Recent Labs  Lab 02/14/20 1631 02/14/20 1631 02/15/20 0300 02/15/20 0300 02/16/20 0149 02/16/20 1029 02/17/20 0238 02/17/20 0844 02/17/20 1529 02/18/20 0027 02/18/20 1232  WBC 7.5  --  12.9*  --  15.3*  --  9.6  --   --  11.7*  --   NEUTROABS 4.6  --   --   --   --   --   --   --   --   --   --   HGB 12.1*   < > 11.5*   < > 8.9*   < > 7.8* 7.9* 9.1* 8.0* 8.2*  HCT 38.1*   < > 36.1*   < > 27.8*   < > 24.3* 24.6* 28.6* 24.6* 25.6*  MCV 104.1*  --  101.4*  --  103.0*  --  103.4*  --   --  98.8  --   PLT 287  --  264  --  225  --  189  --   --  153  --    < > = values in this interval not displayed.   Basic Metabolic Panel: Recent Labs  Lab 02/14/20 1631 02/14/20 1631 02/15/20 0300 02/16/20 0149 02/16/20 1029 02/17/20 0238 02/18/20 0027  NA 139  --  139 139  --  138 138  K 4.6   < > 4.2 5.2* 4.4 4.3 4.3  CL 110  --  108 107  --  107 107  CO2 19*  --  23 26  --  24 24  GLUCOSE 182*  --  161* 189*  --  181* 164*  BUN 18  --  16 17  --  23 29*  CREATININE 1.55*  --  1.38* 1.65*  --  1.82* 1.69*  CALCIUM 9.0  --  9.0 8.5*  --  8.4* 8.4*  MG  --   --   --   --   --   --  1.8  PHOS  --   --   --   --   --   --  2.8   < > = values in this interval not displayed.   GFR: Estimated Creatinine Clearance: 32.4 mL/min (A) (by C-G formula based on SCr of 1.69 mg/dL (H)). Liver Function Tests: Recent Labs  Lab 02/14/20 1631  AST 15  ALT 12  ALKPHOS 89  BILITOT 0.6  PROT 6.3*  ALBUMIN 3.5   No results for input(s): LIPASE, AMYLASE in the last 168 hours. No results for input(s): AMMONIA in the  last 168 hours. Coagulation Profile: Recent Labs  Lab 02/14/20 1631  INR 1.1   Cardiac Enzymes: No results  for input(s): CKTOTAL, CKMB, CKMBINDEX, TROPONINI in the last 168 hours. BNP (last 3 results) No results for input(s): PROBNP in the last 8760 hours. HbA1C: No results for input(s): HGBA1C in the last 72 hours. CBG: Recent Labs  Lab 02/17/20 1207 02/17/20 1653 02/17/20 2025 02/18/20 0638 02/18/20 1153  GLUCAP 245* 166* 195* 128* 162*   Lipid Profile: No results for input(s): CHOL, HDL, LDLCALC, TRIG, CHOLHDL, LDLDIRECT in the last 72 hours. Thyroid Function Tests: No results for input(s): TSH, T4TOTAL, FREET4, T3FREE, THYROIDAB in the last 72 hours. Anemia Panel: No results for input(s): VITAMINB12, FOLATE, FERRITIN, TIBC, IRON, RETICCTPCT in the last 72 hours. Sepsis Labs: No results for input(s): PROCALCITON, LATICACIDVEN in the last 168 hours.  Recent Results (from the past 240 hour(s))  Respiratory Panel by RT PCR (Flu A&B, Covid) - Nasopharyngeal Swab     Status: None   Collection Time: 02/14/20  4:32 PM   Specimen: Nasopharyngeal Swab  Result Value Ref Range Status   SARS Coronavirus 2 by RT PCR NEGATIVE NEGATIVE Final    Comment: (NOTE) SARS-CoV-2 target nucleic acids are NOT DETECTED.  The SARS-CoV-2 RNA is generally detectable in upper respiratoy specimens during the acute phase of infection. The lowest concentration of SARS-CoV-2 viral copies this assay can detect is 131 copies/mL. A negative result does not preclude SARS-Cov-2 infection and should not be used as the sole basis for treatment or other patient management decisions. A negative result may occur with  improper specimen collection/handling, submission of specimen other than nasopharyngeal swab, presence of viral mutation(s) within the areas targeted by this assay, and inadequate number of viral copies (<131 copies/mL). A negative result must be combined with clinical observations, patient history, and epidemiological information. The expected result is Negative.  Fact Sheet for Patients:   PinkCheek.be  Fact Sheet for Healthcare Providers:  GravelBags.it  This test is no t yet approved or cleared by the [place] FDA and  has been authorized for detection and/or diagnosis of SARS-CoV-2 by FDA under an Emergency Use Authorization (EUA). This EUA will remain  in effect (meaning this test can be used) for the duration of the COVID-19 declaration under Section 564(b)(1) of the Act, 21 U.S.C. section 360bbb-3(b)(1), unless the authorization is terminated or revoked sooner.     Influenza A by PCR NEGATIVE NEGATIVE Final   Influenza B by PCR NEGATIVE NEGATIVE Final    Comment: (NOTE) The Xpert Xpress SARS-CoV-2/FLU/RSV assay is intended as an aid in  the diagnosis of influenza from Nasopharyngeal swab specimens and  should not be used as a sole basis for treatment. Nasal washings and  aspirates are unacceptable for Xpert Xpress SARS-CoV-2/FLU/RSV  testing.  Fact Sheet for Patients: PinkCheek.be  Fact Sheet for Healthcare Providers: GravelBags.it  This test is not yet approved or cleared by the [place] FDA and  has been authorized for detection and/or diagnosis of SARS-CoV-2 by  FDA under an Emergency Use Authorization (EUA). This EUA will remain  in effect (meaning this test can be used) for the duration of the  Covid-19 declaration under Section 564(b)(1) of the Act, 21  U.S.C. section 360bbb-3(b)(1), unless the authorization is  terminated or revoked. Performed at [facility], [place] [address] [place], [place] [address]     Radiology Studies: No results found. Scheduled Meds: . aspirin EC  325 mg Oral Q  breakfast  . docusate sodium  100 mg Oral BID  . dorzolamide-timolol  1 drop Left Eye BID  . ferrous sulfate  325 mg Oral BID WC  . insulin aspart  0-9 Units Subcutaneous TID WC  . levothyroxine  88 mcg Oral QAC breakfast  .  lisinopril  5 mg Oral Daily  . multivitamin with minerals  1 tablet Oral Daily   Continuous Infusions: . methocarbamol (ROBAXIN) IV       LOS: 4 days    Time spent: 25 mins    Shawna Clamp, MD [facility]   If 7PM-7AM, please contact night-coverage

## 2020-02-19 LAB — HEMOGLOBIN AND HEMATOCRIT, BLOOD
HCT: 23.4 % — ABNORMAL LOW (ref 39.0–52.0)
HCT: 24.8 % — ABNORMAL LOW (ref 39.0–52.0)
Hemoglobin: 7.6 g/dL — ABNORMAL LOW (ref 13.0–17.0)
Hemoglobin: 7.8 g/dL — ABNORMAL LOW (ref 13.0–17.0)

## 2020-02-19 LAB — BASIC METABOLIC PANEL
Anion gap: 6 (ref 5–15)
BUN: 30 mg/dL — ABNORMAL HIGH (ref 8–23)
CO2: 25 mmol/L (ref 22–32)
Calcium: 8.5 mg/dL — ABNORMAL LOW (ref 8.9–10.3)
Chloride: 107 mmol/L (ref 98–111)
Creatinine, Ser: 1.59 mg/dL — ABNORMAL HIGH (ref 0.61–1.24)
GFR, Estimated: 43 mL/min — ABNORMAL LOW (ref >60)
Glucose, Bld: 141 mg/dL — ABNORMAL HIGH (ref 70–99)
Potassium: 4.2 mmol/L (ref 3.5–5.1)
Sodium: 138 mmol/L (ref 135–145)

## 2020-02-19 LAB — GLUCOSE, CAPILLARY
Glucose-Capillary: 112 mg/dL — ABNORMAL HIGH (ref 70–99)
Glucose-Capillary: 189 mg/dL — ABNORMAL HIGH (ref 70–99)
Glucose-Capillary: 120 mg/dL — ABNORMAL HIGH (ref 70–99)
Glucose-Capillary: 125 mg/dL — ABNORMAL HIGH (ref 70–99)

## 2020-02-19 LAB — OCCULT BLOOD X 1 CARD TO LAB, STOOL: Fecal Occult Bld: NEGATIVE

## 2020-02-19 NOTE — Progress Notes (Signed)
Hemocult slide sent to the lab as ordered.  No obvious signs of bleeding noted when hemocult done.  Light mucous type noted on specimen slide.

## 2020-02-19 NOTE — Progress Notes (Signed)
Physical Therapy Treatment Patient Details Name: Douglas Melton MRN: [id number] DOB: 1936/03/24 Today's Date: 02/19/2020    History of Present Illness Douglas Melton is a 84 y.o. male with medical history significant of anemia, diabetes, hypertension, CKD 3, hypothyroidism, blindness who presents following a fall at home.  Patient was attempting to move a drawer anterior chest of drawers when he tripped over a table    PT Comments    Continuing work on functional mobility and activity tolerance;  Noteworthy improvements in activity tolerance, and BPs were still variable, but did not completely preclude walking (see below); Pt and wife tell me they will be going to post-acute rehab at SNF soon; PT in agreement   Follow Up Recommendations  SNF     Equipment Recommendations  Rolling walker with 5" wheels;3in1 (PT) (may need WC if orthostasis persists)    Recommendations for Other Services       Precautions / Restrictions Precautions Precautions: Fall Photographer) Precaution Comments: patient is blind Restrictions LLE Weight Bearing: Weight bearing as tolerated    Mobility  Bed Mobility Overal bed mobility: Needs Assistance Bed Mobility: Supine to Sit     Supine to sit: Mod assist     General bed mobility comments: Initiated pusing up to sit well, light Mod assist and use of bed pad to square off hips at EOB  Transfers Overall transfer level: Needs assistance Equipment used: Rolling walker (2 wheeled) Transfers: Sit to/from Stand Sit to Stand: Mod assist         General transfer comment: Stood from EOB with cues for hand placement and light mod assist to power up ; bed elevated  Ambulation/Gait Ambulation/Gait assistance: Min assist;+2 safety/equipment Gait Distance (Feet): 16 Feet Assistive device: Rolling walker (2 wheeled) Gait Pattern/deviations: Decreased step length - right;Decreased step length - left;Decreased stride length;Trunk flexed Gait  velocity: slowed   General Gait Details: Cues to self-monitor for activity tolerance; able to walk from far side of bed to the door before feeling weak and asking to sit down   Stairs             Wheelchair Mobility    Modified Rankin (Stroke Patients Only)       Balance     Sitting balance-Leahy Scale: Fair       Standing balance-Leahy Scale: Poor                              Cognition Arousal/Alertness: Awake/alert Behavior During Therapy: WFL for tasks assessed/performed Overall Cognitive Status: Within Functional Limits for tasks assessed                                 General Comments: Seems he does not entirely understand how/why he is not safe to walk with the standing dizziness;  very focused on walking      Exercises Total Joint Exercises Quad Sets: AROM;Left;5 reps Heel Slides: AAROM;Left;10 reps (self AAROM with belt)    General Comments General comments (skin integrity, edema, etc.):   02/19/20 1500 02/19/20 1608  Orthostatic Lying   BP- Lying 133/60 (83)  --   Pulse- Lying 89  --   Orthostatic Sitting  BP- Sitting 133/74 (MAP 90) 105/69 (MAP 81)  Pulse- Sitting 97 89  Orthostatic Standing at 0 minutes  BP- Standing at 0 minutes 91/67 (MAP 72)  --   Pulse- Standing at  0 minutes 110  --          Pertinent Vitals/Pain Pain Assessment: No/denies pain Pain Intervention(s): Monitored during session    Home Living                      Prior Function            PT Goals (current goals can now be found in the care plan section) Acute Rehab PT Goals Patient Stated Goal: to hopefully return home if he can get around PT Goal Formulation: With patient/family Time For Goal Achievement: 02/29/20 Potential to Achieve Goals: Fair Progress towards PT goals: Progressing toward goals    Frequency    Min 3X/week      PT Plan Current plan remains appropriate;Frequency needs to be updated     Co-evaluation              AM-PAC PT "6 Clicks" Mobility   Outcome Measure  Help needed turning from your back to your side while in a flat bed without using bedrails?: A Little Help needed moving from lying on your back to sitting on the side of a flat bed without using bedrails?: A Lot Help needed moving to and from a bed to a chair (including a wheelchair)?: A Little Help needed standing up from a chair using your arms (e.g., wheelchair or bedside chair)?: A Little Help needed to walk in hospital room?: A Lot Help needed climbing 3-5 steps with a railing? : A Lot 6 Click Score: 15    End of Session Equipment Utilized During Treatment: Gait belt Activity Tolerance: Patient tolerated treatment well Patient left: in chair;with call bell/phone within reach;with family/visitor present Nurse Communication: Mobility status PT Visit Diagnosis: Muscle weakness (generalized) (M62.81);Difficulty in walking, not elsewhere classified (R26.2);History of falling (Z91.81);Other abnormalities of gait and mobility (R26.89)     Time: 1500 (in and out times are approximate)-1530 PT Time Calculation (min) (ACUTE ONLY): 30 min  Charges:  $Gait Training: 8-22 mins $Therapeutic Exercise: 23-37 mins                     Roney Marion, PT  Acute Rehabilitation Services Pager [phone] Office [place] 02/19/2020, 6:04 PM

## 2020-02-19 NOTE — [place] FL2 (Signed)
[place] MEDICAID FL2 LEVEL OF CARE SCREENING TOOL     IDENTIFICATION  Patient Name: Douglas Melton Birthdate: [birth year] Sex: male Admission Date (Current Location): 02/14/2020  [facility] and [place] Number:  Herbalist and Address:  [facility] [place], [place] [address], [place], [place] [address]      Provider Number: [id number]  Attending Physician Name and Address:  Shawna Clamp, MD  Relative Name and Phone Number:       Current Level of Care: SNF Recommended Level of Care: [place] Prior Approval Number:    Date Approved/Denied:   PASRR Number: [id number]  Discharge Plan: SNF    Current Diagnoses: Patient Active Problem List   Diagnosis Date Noted  . Left displaced femoral neck fracture ([place]) 02/14/2020  . Hematemesis 11/01/2018  . Symptomatic anemia 11/01/2018  . Diabetes mellitus without complication ([place])   . Hypertension associated with diabetes ([place])   . CKD (chronic kidney disease), stage III ([place])   . Hypothyroidism   . Pancytopenia ([place])     Orientation RESPIRATION BLADDER Height & Weight     Self, Time, Situation, Place  Normal Continent Weight: 68 kg Height:  6\' 2"  (188 cm)  BEHAVIORAL SYMPTOMS/MOOD NEUROLOGICAL BOWEL NUTRITION STATUS      Continent Diet (refer to d/c summary)  AMBULATORY STATUS COMMUNICATION OF NEEDS Skin   Extensive Assist Verbally Surgical wounds (Left total hip arthroplasty, 10/22)                       Personal Care Assistance Level of Assistance  Bathing, Feeding, Dressing Bathing Assistance: Maximum assistance Feeding assistance: Limited assistance Dressing Assistance: Maximum assistance     Functional Limitations Info  Sight, Speech, Hearing Sight Info: Impaired (blind) Hearing Info: Adequate Speech Info: Adequate    SPECIAL CARE FACTORS FREQUENCY  PT (By licensed PT), OT (By licensed OT)     PT Frequency: 5x/week,evaluate and treat OT Frequency: 5x/week,  evaluate and treat            Contractures Contractures Info: Not present    Additional Factors Info  Code Status, Allergies Code Status Info: Full codeine Allergies Info: Adhesive Tape, Fluorescein-benoxinate, Latex           Current Medications (02/19/2020):  This is the current hospital active medication list Current Facility-Administered Medications  Medication Dose Route Frequency Provider Last Rate Last Admin  . aspirin EC tablet 325 mg  325 mg Oral Q breakfast Rod Can, MD   325 mg at 02/19/20 0932  . docusate sodium (COLACE) capsule 100 mg  100 mg Oral BID Rod Can, MD   100 mg at 02/19/20 0933  . dorzolamide-timolol (COSOPT) 22.3-6.8 MG/ML ophthalmic solution 1 drop  1 drop Left Eye BID Swinteck, Aaron Edelman, MD      . ferrous sulfate tablet 325 mg  325 mg Oral BID WC Paralee Cancel, MD   325 mg at 02/19/20 0900  . HYDROcodone-acetaminophen (NORCO/VICODIN) 5-325 MG per tablet 1-2 tablet  1-2 tablet Oral Q8H PRN Rod Can, MD   1 tablet at 02/15/20 0805  . insulin aspart (novoLOG) injection 0-9 Units  0-9 Units Subcutaneous TID WC Rod Can, MD   2 Units at 02/19/20 1228  . levothyroxine (SYNTHROID) tablet 88 mcg  88 mcg Oral QAC breakfast Rod Can, MD   88 mcg at 02/19/20 0604  . lisinopril (ZESTRIL) tablet 5 mg  5 mg Oral Daily Swinteck, Aaron Edelman, MD   5 mg at  02/19/20 0933  . menthol-cetylpyridinium (CEPACOL) lozenge 3 mg  1 lozenge Oral PRN Swinteck, Aaron Edelman, MD       Or  . phenol (CHLORASEPTIC) mouth spray 1 spray  1 spray Mouth/Throat PRN Swinteck, Aaron Edelman, MD      . methocarbamol (ROBAXIN) tablet 500 mg  500 mg Oral Q6H PRN Swinteck, Aaron Edelman, MD       Or  . methocarbamol (ROBAXIN) 500 mg in dextrose 5 % 50 mL IVPB  500 mg Intravenous Q6H PRN Swinteck, Aaron Edelman, MD      . metoCLOPramide (REGLAN) tablet 5-10 mg  5-10 mg Oral Q8H PRN Swinteck, Aaron Edelman, MD       Or  . metoCLOPramide (REGLAN) injection 5-10 mg  5-10 mg Intravenous Q8H PRN Swinteck, Aaron Edelman, MD       . morphine 2 MG/ML injection 0.5 mg  0.5 mg Intravenous Q3H PRN Rod Can, MD   0.5 mg at 02/15/20 1231  . multivitamin with minerals tablet 1 tablet  1 tablet Oral Daily Rod Can, MD   1 tablet at 02/19/20 0933  . ondansetron (ZOFRAN) tablet 4 mg  4 mg Oral Q6H PRN Swinteck, Aaron Edelman, MD       Or  . ondansetron (ZOFRAN) injection 4 mg  4 mg Intravenous Q6H PRN Swinteck, Aaron Edelman, MD      . polyethylene glycol (MIRALAX / GLYCOLAX) packet 17 g  17 g Oral Daily PRN Rod Can, MD         Discharge Medications: Please see discharge summary for a list of discharge medications.  Relevant Imaging Results:  Relevant Lab Results:   Additional Information SS# [id number]  Sharin Mons, RN

## 2020-02-19 NOTE — Plan of Care (Signed)

## 2020-02-19 NOTE — Progress Notes (Signed)
PROGRESS NOTE    Douglas Melton  YHC:[id number] DOB: Apr 23, 1936 DOA: 02/14/2020 PCP: [name], MD   Brief Narrative: 84 years old male with medical history significant for chronic anemia, diabetes, hypertension, CKD stage III, hypothyroidism, blindness presents s/p fall at home.  He denies any LOC or head injury.  He is found to have left femoral neck fracture on x-ray.  He is also found to have prolonged QTC which resolved.  Orthopedics consulted,  Patient underwent left hip arthroplasty 10/22, tolerated well. He is doing much better, PT recommended SNF given unsteadiness.   Assessment & Plan:   Principal Problem:   Left displaced femoral neck fracture (HCC) Active Problems:   Diabetes mellitus without complication ([place])   Hypertension associated with diabetes ([place])   CKD (chronic kidney disease), stage III ([place])   Hypothyroidism   Left displaced femoral neck fracture:  - Orthopedics consulted in ED,  He underwent Left total hip arthroplasty 02/15/20. - Pain control with Norco every 6 hours as needed for moderate pain and morphine 0.2 mg every 4 hours as needed for severe pain. - Zofran as needed for Nausea and vomiting. - SCDs. - Patient participated in physical therapy but felt weak. - PT recommended SNF.  Prolonged QT > Improved - QTC elevated to 539 - Avoid QTC prolonging medications - Monitor on telemetry - Did receive one-time dose of Compazine for nausea. - Repeat EKG QTC 441  CKD 3a - Creatinine at baseline of 1.5  - Avid nephrotoxic medications.  Hypothyroidism - Continue home Synthroid  Diabetes - Takes Metformin at home. - Sliding scale insulin here.  Hypertension -Continue home lisinopril.  Normocytic anemia: Hb dropped to 7.4 , He received I unit PRBC Stool for occult bleed negative. This could be hemodilution.  moniter H and H.   DVT prophylaxis: SCDS Code Status: Full Family Communication:  No one at bed side. Disposition Plan:   Status is: Inpatient  Remains inpatient appropriate because:Inpatient level of care appropriate due to severity of illness   Dispo: The patient is from: Home              Anticipated d/c is to: SNF              Anticipated d/c date is: 10/27              Patient currently is not medically stable to d/c.   Consultants:   Orthopeadics.  Procedures:  Left THA  Antimicrobials:  Anti-infectives (From admission, onward)   Start     Dose/Rate Route Frequency Ordered Stop   02/16/20 0600  ceFAZolin (ANCEF) IVPB 2g/100 mL premix        2 g 200 mL/hr over 30 Minutes Intravenous On call to O.R. 02/15/20 1428 02/15/20 1608   02/15/20 2200  ceFAZolin (ANCEF) IVPB 2g/100 mL premix        2 g 200 mL/hr over 30 Minutes Intravenous Every 6 hours 02/15/20 2010 02/16/20 0454   02/15/20 1430  ceFAZolin (ANCEF) 2-4 GM/100ML-% IVPB       Note to Pharmacy: Ladoris Gene   : cabinet override      02/15/20 1430 02/15/20 1638      Subjective: Patient is seen and examined at bedside.  Overnight events noted.   Patient reports feeling improved. Dizziness has improved.  He still reports generalized weakness, left leg pain is better.   Objective: Vitals:   02/19/20 0333 02/19/20 0933 02/19/20 1210 02/19/20 1212  BP: 121/72 121/72 127/68  Pulse: 96  98   Resp: 16  16   Temp: 98.8 F (37.1 C)  99.3 F (37.4 C)   TempSrc: Oral  Oral   SpO2: 97%  97% 97%  Weight:      Height:        Intake/Output Summary (Last 24 hours) at 02/19/2020 1615 Last data filed at 02/19/2020 1223 Gross per 24 hour  Intake 840 ml  Output 550 ml  Net 290 ml   Filed Weights   02/15/20 2021  Weight: 68 kg    Examination:  General exam: Appears calm and comfortable. Respiratory system: Clear to auscultation. Respiratory effort normal. Cardiovascular system: S1 & S2 heard, RRR. No JVD, murmurs, rubs, gallops or clicks. No pedal edema. Gastrointestinal system: Abdomen is nondistended, soft and nontender. No  organomegaly or masses felt. Normal bowel sounds heard. Central nervous system: Alert and oriented. No focal neurological deficits. Extremities: Left thigh tenderness noted. ROM: restricted. Skin: No rashes, lesions or ulcers Psychiatry: Judgement and insight appear normal. Mood & affect appropriate.     Data Reviewed: I have personally reviewed following labs and imaging studies  CBC: Recent Labs  Lab 02/14/20 1631 02/14/20 1631 02/15/20 0300 02/15/20 0300 02/16/20 0149 02/16/20 1029 02/17/20 0238 02/17/20 0844 02/17/20 1529 02/18/20 0027 02/18/20 1232 02/19/20 0118 02/19/20 1315  WBC 7.5  --  12.9*  --  15.3*  --  9.6  --   --  11.7*  --   --   --   NEUTROABS 4.6  --   --   --   --   --   --   --   --   --   --   --   --   HGB 12.1*   < > 11.5*   < > 8.9*   < > 7.8*   < > 9.1* 8.0* 8.2* 7.6* 7.8*  HCT 38.1*   < > 36.1*   < > 27.8*   < > 24.3*   < > 28.6* 24.6* 25.6* 23.4* 24.8*  MCV 104.1*  --  101.4*  --  103.0*  --  103.4*  --   --  98.8  --   --   --   PLT 287  --  264  --  225  --  189  --   --  153  --   --   --    < > = values in this interval not displayed.   Basic Metabolic Panel: Recent Labs  Lab 02/15/20 0300 02/15/20 0300 02/16/20 0149 02/16/20 1029 02/17/20 0238 02/18/20 0027 02/19/20 0118  NA 139  --  139  --  138 138 138  K 4.2   < > 5.2* 4.4 4.3 4.3 4.2  CL 108  --  107  --  107 107 107  CO2 23  --  26  --  24 24 25   GLUCOSE 161*  --  189*  --  181* 164* 141*  BUN 16  --  17  --  23 29* 30*  CREATININE 1.38*  --  1.65*  --  1.82* 1.69* 1.59*  CALCIUM 9.0  --  8.5*  --  8.4* 8.4* 8.5*  MG  --   --   --   --   --  1.8  --   PHOS  --   --   --   --   --  2.8  --    < > = values in this interval  not displayed.   GFR: Estimated Creatinine Clearance: 34.5 mL/min (A) (by C-G formula based on SCr of 1.59 mg/dL (H)). Liver Function Tests: Recent Labs  Lab 02/14/20 1631  AST 15  ALT 12  ALKPHOS 89  BILITOT 0.6  PROT 6.3*  ALBUMIN 3.5   No  results for input(s): LIPASE, AMYLASE in the last 168 hours. No results for input(s): AMMONIA in the last 168 hours. Coagulation Profile: Recent Labs  Lab 02/14/20 1631  INR 1.1   Cardiac Enzymes: No results for input(s): CKTOTAL, CKMB, CKMBINDEX, TROPONINI in the last 168 hours. BNP (last 3 results) No results for input(s): PROBNP in the last 8760 hours. HbA1C: No results for input(s): HGBA1C in the last 72 hours. CBG: Recent Labs  Lab 02/18/20 0638 02/18/20 1153 02/18/20 1651 02/18/20 2035 02/19/20 0639  GLUCAP 128* 162* 134* 208* 112*   Lipid Profile: No results for input(s): CHOL, HDL, LDLCALC, TRIG, CHOLHDL, LDLDIRECT in the last 72 hours. Thyroid Function Tests: No results for input(s): TSH, T4TOTAL, FREET4, T3FREE, THYROIDAB in the last 72 hours. Anemia Panel: No results for input(s): VITAMINB12, FOLATE, FERRITIN, TIBC, IRON, RETICCTPCT in the last 72 hours. Sepsis Labs: No results for input(s): PROCALCITON, LATICACIDVEN in the last 168 hours.  Recent Results (from the past 240 hour(s))  Respiratory Panel by RT PCR (Flu A&B, Covid) - Nasopharyngeal Swab     Status: None   Collection Time: 02/14/20  4:32 PM   Specimen: Nasopharyngeal Swab  Result Value Ref Range Status   SARS Coronavirus 2 by RT PCR NEGATIVE NEGATIVE Final    Comment: (NOTE) SARS-CoV-2 target nucleic acids are NOT DETECTED.  The SARS-CoV-2 RNA is generally detectable in upper respiratoy specimens during the acute phase of infection. The lowest concentration of SARS-CoV-2 viral copies this assay can detect is 131 copies/mL. A negative result does not preclude SARS-Cov-2 infection and should not be used as the sole basis for treatment or other patient management decisions. A negative result may occur with  improper specimen collection/handling, submission of specimen other than nasopharyngeal swab, presence of viral mutation(s) within the areas targeted by this assay, and inadequate number of  viral copies (<131 copies/mL). A negative result must be combined with clinical observations, patient history, and epidemiological information. The expected result is Negative.  Fact Sheet for Patients:  PinkCheek.be  Fact Sheet for Healthcare Providers:  GravelBags.it  This test is no t yet approved or cleared by the [place] FDA and  has been authorized for detection and/or diagnosis of SARS-CoV-2 by FDA under an Emergency Use Authorization (EUA). This EUA will remain  in effect (meaning this test can be used) for the duration of the COVID-19 declaration under Section 564(b)(1) of the Act, 21 U.S.C. section 360bbb-3(b)(1), unless the authorization is terminated or revoked sooner.     Influenza A by PCR NEGATIVE NEGATIVE Final   Influenza B by PCR NEGATIVE NEGATIVE Final    Comment: (NOTE) The Xpert Xpress SARS-CoV-2/FLU/RSV assay is intended as an aid in  the diagnosis of influenza from Nasopharyngeal swab specimens and  should not be used as a sole basis for treatment. Nasal washings and  aspirates are unacceptable for Xpert Xpress SARS-CoV-2/FLU/RSV  testing.  Fact Sheet for Patients: PinkCheek.be  Fact Sheet for Healthcare Providers: GravelBags.it  This test is not yet approved or cleared by the [place] FDA and  has been authorized for detection and/or diagnosis of SARS-CoV-2 by  FDA under an Emergency Use Authorization (EUA). This EUA will remain  in effect (meaning this test can be used) for the duration of the  Covid-19 declaration under Section 564(b)(1) of the Act, 21  U.S.C. section 360bbb-3(b)(1), unless the authorization is  terminated or revoked. Performed at [facility], [place] [address], [place], [place] [address]     Radiology Studies: No results found. Scheduled Meds: . aspirin EC  325 mg Oral Q breakfast  . docusate  sodium  100 mg Oral BID  . dorzolamide-timolol  1 drop Left Eye BID  . ferrous sulfate  325 mg Oral BID WC  . insulin aspart  0-9 Units Subcutaneous TID WC  . levothyroxine  88 mcg Oral QAC breakfast  . lisinopril  5 mg Oral Daily  . multivitamin with minerals  1 tablet Oral Daily   Continuous Infusions: . methocarbamol (ROBAXIN) IV       LOS: 5 days    Time spent: 25 mins    Shawna Clamp, MD [facility]   If 7PM-7AM, please contact night-coverage

## 2020-02-19 NOTE — TOC Initial Note (Addendum)
Transition of Care [facility]) - Initial/Assessment Note    Patient Details  Name: Douglas Melton MRN: [id number] Date of Birth: 1935-10-21  [facility] CM/SW Contact:    Sharin Mons, RN Phone Number: 02/19/2020, 3:49 PM  Clinical Narrative:                 RNCM received consult for possible SNF placement at time of discharge. RNCM spoke with patient regarding PT recommendation of SNF placement at time of discharge. Patient reported that patient's spouse Silva Bandy  is currently unable to care for patient at their home given patient's current physical needs and fall risk. Patient and wife expressed understanding of PT recommendation and are agreeable to SNF placement at time of discharge. Patient reports preference for  [facility] . RNCM discussed insurance authorization process and provided Medicare SNF ratings list. Patient expressed being hopeful for rehab and to feel better soon. No further questions reported at this time. RNCM to continue to follow and assist with discharge planning needs.  [place] authorization pending for SNF placement  Expected Discharge Plan: [place] Barriers to Discharge: Ship broker, Continued Medical Work up   Patient Goals and CMS Choice   CMS Medicare.gov Compare Post Acute Care list provided to:: Patient Choice offered to / list presented to : Patient, Spouse  Expected Discharge Plan and Services Expected Discharge Plan: [place]   Discharge Planning Services: CM Consult   Living arrangements for the past 2 months: Single Family Home                                      Prior Living Arrangements/Services Living arrangements for the past 2 months: Single Family Home Lives with:: Spouse Patient language and need for interpreter reviewed:: Yes Do you feel safe going back to the place where you live?: Yes      Need for Family Participation in Patient Care: Yes  (Comment) Care giver support system in place?: Yes (comment)   Criminal Activity/Legal Involvement Pertinent to Current Situation/Hospitalization: No - Comment as needed  Activities of Daily Living Home Assistive Devices/Equipment: None ADL Screening (condition at time of admission) Patient's cognitive ability adequate to safely complete daily activities?: Yes Is the patient deaf or have difficulty hearing?: No Does the patient have difficulty seeing, even when wearing glasses/contacts?: Yes Does the patient have difficulty concentrating, remembering, or making decisions?: No Patient able to express need for assistance with ADLs?: Yes Does the patient have difficulty dressing or bathing?: Yes Independently performs ADLs?: No Does the patient have difficulty walking or climbing stairs?: Yes Weakness of Legs: Left Weakness of Arms/Hands: None  Permission Sought/Granted   Permission granted to share information with : Yes, Verbal Permission Granted  Share Information with NAME: Ferdie Bakken (Spouse) [phone]           Emotional Assessment Appearance:: Appears stated age Attitude/Demeanor/Rapport: Engaged Affect (typically observed): Accepting Orientation: : Oriented to Place, Oriented to Self, Oriented to  Time, Oriented to Situation Alcohol / Substance Use: Not Applicable Psych Involvement: No (comment)  Admission diagnosis:  Left displaced femoral neck fracture (HCC) [S72.002A] Closed fracture of left hip, initial encounter ([place]) [S72.002A] Fall, initial encounter [W19.XXXA] Closed displaced fracture of left femoral neck ([place]) [S72.002A] Suspected fracture of left hip [R29.898] Patient Active Problem List   Diagnosis Date Noted  . Left displaced femoral neck fracture ([place])  02/14/2020  . Hematemesis 11/01/2018  . Symptomatic anemia 11/01/2018  . Diabetes mellitus without complication ([employer])   . Hypertension associated with diabetes ([place])   . CKD (chronic kidney  disease), stage III ([place])   . Hypothyroidism   . Pancytopenia ([place])    PCP:  [name], MD Pharmacy:   [id number] - [place], [place] - [address] [place] [address] Phone: [phone] Fax: [phone]     Social Determinants of Health (SDOH) Interventions    Readmission Risk Interventions No flowsheet data found.

## 2020-02-20 ENCOUNTER — Encounter (HOSPITAL_COMMUNITY): Payer: Self-pay | Admitting: Orthopedic Surgery

## 2020-02-20 LAB — BASIC METABOLIC PANEL
Anion gap: 10 (ref 5–15)
BUN: 33 mg/dL — ABNORMAL HIGH (ref 8–23)
CO2: 22 mmol/L (ref 22–32)
Calcium: 8.6 mg/dL — ABNORMAL LOW (ref 8.9–10.3)
Chloride: 106 mmol/L (ref 98–111)
Creatinine, Ser: 1.48 mg/dL — ABNORMAL HIGH (ref 0.61–1.24)
GFR, Estimated: 47 mL/min — ABNORMAL LOW (ref >60)
Glucose, Bld: 106 mg/dL — ABNORMAL HIGH (ref 70–99)
Potassium: 4.4 mmol/L (ref 3.5–5.1)
Sodium: 138 mmol/L (ref 135–145)

## 2020-02-20 LAB — RESPIRATORY PANEL BY RT PCR (FLU A&B, COVID)
Influenza A by PCR: NEGATIVE
Influenza B by PCR: NEGATIVE
SARS Coronavirus 2 by RT PCR: NEGATIVE

## 2020-02-20 LAB — MAGNESIUM: Magnesium: 2 mg/dL (ref 1.7–2.4)

## 2020-02-20 LAB — HEMOGLOBIN AND HEMATOCRIT, BLOOD
HCT: 24.6 % — ABNORMAL LOW (ref 39.0–52.0)
Hemoglobin: 7.9 g/dL — ABNORMAL LOW (ref 13.0–17.0)

## 2020-02-20 LAB — GLUCOSE, CAPILLARY
Glucose-Capillary: 108 mg/dL — ABNORMAL HIGH (ref 70–99)
Glucose-Capillary: 110 mg/dL — ABNORMAL HIGH (ref 70–99)
Glucose-Capillary: 128 mg/dL — ABNORMAL HIGH (ref 70–99)

## 2020-02-20 LAB — PHOSPHORUS: Phosphorus: 2.9 mg/dL (ref 2.5–4.6)

## 2020-02-20 MED ORDER — FERROUS SULFATE 325 (65 FE) MG PO TABS: 325.0000 mg | ORAL_TABLET | Freq: Two times a day (BID) | ORAL | 3 refills | Status: AC

## 2020-02-20 NOTE — Progress Notes (Signed)
Report called to [facility] at [facility].  A discharge packet has been printed and will be provided to the SNF The wife and patient is aware of the discharge and transfer.  Awaiting for PTAR

## 2020-02-20 NOTE — Plan of Care (Signed)
Patient is POD #5 of left total hip arthroplasty. Pending insurance auth for placement at [facility]. Patient is currently not in any pain or in distress at this time. Will see what H&H looks like with morning labs. Will continue to monitor and continue POC.

## 2020-02-20 NOTE — Discharge Summary (Signed)
Physician Discharge Summary  Douglas Melton ZOX:[id number] DOB: [birth year] DOA: 02/14/2020  PCP: [name], MD  Admit date: 02/14/2020   Discharge date: 02/20/2020  Admitted From:  Home.  [facility]  Recommendations for Outpatient Follow-up:  1. Follow up with PCP in 1-2 weeks. 2. Please obtain BMP/CBC in one week. 3. Advised to follow up with Orthopaedics in 2 weeks. 4. Advised to take ferrous sulfate twice daily for iron def. Anemia. 5. Advised physical therapy as advised.  Home Health: None.  Equipment/Devices: None  Discharge Condition: Stable CODE STATUS:Full code Diet recommendation: Heart Healthy   Brief Summary / Hospital course: This 84 years old male with medical history significant for chronic anemia, diabetes, hypertension, CKD stage III, hypothyroidism, blindness presents s/p fall at home.  He denies any LOC or head injury.  He is found to have left femoral neck fracture on x-ray.  He is also found to have prolonged QTC which resolved.  Orthopedics was consulted,  Patient underwent left hip arthroplasty on 10/22, tolerated well. He is doing much better, PT recommended SNF given unsteadiness. Patient's Hb dropped to 7.4,  required 1 unit of PRBC. Post transfusion Hb remains stable. Stool for occult blood negative. Cleared from ortho to be discharged.  Patient is being discharged to skilled nursing facility for rehab.  Patient will follow up with orthopedics in 2 weeks.  He was managed for below problems.  Discharge Diagnoses:  Principal Problem:   Left displaced femoral neck fracture (HCC) Active Problems:   Diabetes mellitus without complication ([place])   Hypertension associated with diabetes ([place])   CKD (chronic kidney disease), stage III ([place])   Hypothyroidism  Left displaced femoral neck fracture:  -Orthopedics consulted in ED,  He underwent Left total hip arthroplasty 02/15/20. -Pain control with Norco every 6 hours as needed for  moderate pain and morphine 0.2 mg every 4 hours as needed for severe pain. -Zofran as needed for Nausea and vomiting. -SCDs. - Patient participated in physical therapy but felt weak. - PT recommended SNF.  Prolonged QT > Improved -QTC elevated to 539 -Avoid QTC prolonging medications -Monitor on telemetry -Did receive one-time dose of Compazine for nausea. - Repeat EKG QTC 441  CKD 3a -Creatinine at baseline of 1.5  - Avid nephrotoxic medications.  Hypothyroidism -Continue home Synthroid  Diabetes -Takes Metformin at home. -Sliding scale insulin here.  Hypertension -Continue home lisinopril.  Normocytic anemia: Hb dropped to 7.4 , He received I unit PRBC Stool for occult bleed negative. This could be hemodilution.  moniter H and H Found to have iron def. Anemia,  Advised ferrous sulfate bid.   Discharge Instructions  Discharge Instructions    Call MD for:  persistant dizziness or light-headedness   Complete by: As directed    Call MD for:  persistant nausea and vomiting   Complete by: As directed    Call MD for:  severe uncontrolled pain   Complete by: As directed    Diet - low sodium heart healthy   Complete by: As directed    Diet Carb Modified   Complete by: As directed    Discharge instructions   Complete by: As directed    Advised to follow up PCP in one week. Advised to follow up Orthopaedics  in 2 weeks. Advised to take ferrous sulfate twice daily for iron def. Anemia. Advised physical therapy as advised.   Discharge wound care:   Complete by: As directed    Wound care  at nursing home.   Increase activity slowly   Complete by: As directed      Allergies as of 02/20/2020      Reactions   Adhesive [tape] Rash   Fluorescein-benoxinate Rash   FA dye (Fluress- eyes)   Latex Rash      Medication List    TAKE these medications   aspirin 81 MG chewable tablet Commonly known as: Aspirin Childrens Chew 1 tablet (81 mg total) by  mouth 2 (two) times daily with a meal.   ferrous sulfate 325 (65 FE) MG tablet Take 1 tablet (325 mg total) by mouth 2 (two) times daily with a meal.   folic acid 732 MCG tablet Commonly known as: FOLVITE Take 400 mcg by mouth daily.   HYDROcodone-acetaminophen 5-325 MG tablet Commonly known as: NORCO/VICODIN Take 1-2 tablets by mouth every 6 (six) hours as needed for moderate pain.   levothyroxine 88 MCG tablet Commonly known as: SYNTHROID Take 88 mcg by mouth daily.   lisinopril 5 MG tablet Commonly known as: ZESTRIL Take 5 mg by mouth daily.   metFORMIN 500 MG 24 hr tablet Commonly known as: GLUCOPHAGE-XR Take 500 mg by mouth every evening.   vitamin B-12 100 MCG tablet Commonly known as: CYANOCOBALAMIN Take 100 mcg by mouth daily.            Discharge Care Instructions  (From admission, onward)         Start     Ordered   02/20/20 0000  Discharge wound care:       Comments: Wound care at nursing home.   02/20/20 1217          Contact information for follow-up providers    Swinteck, Aaron Edelman, MD. Schedule an appointment as soon as possible for a visit in 2 weeks.   Specialty: Orthopedic Surgery Why: For wound re-check Contact information: [address]  [place] [address] [phone]        [name], MD Follow up in 1 week(s).   Specialty: Family Medicine Contact information: [address] [place] [address] [place] [address] [phone]            Contact information for after-discharge care    Destination    [facility] .   Service: Skilled Nursing Contact information: [address] [place] [address] [place] [phone]                 Allergies  Allergen Reactions  . Adhesive [Tape] Rash  . Fluorescein-Benoxinate Rash    FA dye (Fluress- eyes)    . Latex Rash    Consultations:  Orthopeadics   Procedures/Studies: DG Chest 1 View  Result  Date: 02/14/2020 CLINICAL DATA:  Recent fall with chest pain, possible hip fracture, initial encounter EXAM: CHEST  1 VIEW COMPARISON:  12/17/2007 FINDINGS: Cardiac shadow is stable. Aortic calcifications are noted. The lungs are well aerated bilaterally. No focal infiltrate or sizable effusion is seen. No bony abnormality is noted. IMPRESSION: No active disease. Electronically Signed   By: Inez Catalina M.D.   On: 02/14/2020 17:37   Pelvis Portable  Result Date: 02/15/2020 CLINICAL DATA:  Postsurgical changes EXAM: PORTABLE PELVIS 1-2 VIEWS COMPARISON:  Radiograph same day FINDINGS: The patient is status post left total hip arthroplasty without without a periprosthetic fracture. Is there is a probable chronic appearing inferior pubic rami fracture on the left. Overlying subcutaneous emphysema is seen. IMPRESSION: Status post left total  hip arthroplasty without acute complication. Electronically Signed   By: Prudencio Pair M.D.   On: 02/15/2020 19:06   DG Knee Left Port  Result Date: 02/15/2020 CLINICAL DATA:  Left femoral neck fracture EXAM: PORTABLE LEFT KNEE - 1-2 VIEW COMPARISON:  None. FINDINGS: No evidence of fracture, dislocation, or joint effusion. Mild tricompartmental joint space narrowing. Bidirectional patellar enthesophytes. Extensive vascular calcifications. No focal soft tissue swelling. IMPRESSION: 1. No acute osseous abnormality, left knee. 2. Mild tricompartmental osteoarthritis. Electronically Signed   By: Davina Poke D.O.   On: 02/15/2020 11:10   DG C-Arm 1-60 Min  Result Date: 02/15/2020 CLINICAL DATA:  Left hip replacement EXAM: OPERATIVE LEFT HIP (WITH PELVIS IF PERFORMED) 4 VIEWS TECHNIQUE: Fluoroscopic spot image(s) were submitted for interpretation post-operatively. COMPARISON:  02/14/2020 FINDINGS: Multiple intraoperative spot hip replacement images demonstrate. Normal AP changes of left alignment. No visible hardware or bony complicating feature. IMPRESSION: Left hip  replacement.  No visible complicating feature. Electronically Signed   By: Rolm Baptise M.D.   On: 02/15/2020 17:46   DG HIP OPERATIVE UNILAT W OR W/O PELVIS LEFT  Result Date: 02/15/2020 CLINICAL DATA:  Left hip replacement EXAM: OPERATIVE LEFT HIP (WITH PELVIS IF PERFORMED) 4 VIEWS TECHNIQUE: Fluoroscopic spot image(s) were submitted for interpretation post-operatively. COMPARISON:  02/14/2020 FINDINGS: Multiple intraoperative spot hip replacement images demonstrate. Normal AP changes of left alignment. No visible hardware or bony complicating feature. IMPRESSION: Left hip replacement.  No visible complicating feature. Electronically Signed   By: Rolm Baptise M.D.   On: 02/15/2020 17:46   DG Hip Unilat W or Wo Pelvis 2-3 Views Left  Result Date: 02/14/2020 CLINICAL DATA:  Fall with shortening of the left leg EXAM: DG HIP (WITH OR WITHOUT PELVIS) 2-3V LEFT COMPARISON:  None. FINDINGS: SI joints are non widened. Zipper artifact on the images. Right femoral head projects in joint. Pubic symphysis appears intact. Probable chronic fracture deformity at the left inferior pubic ramus. Acute mildly displaced left femoral neck fracture with mild cranial migration of the trochanter. No left femoral head dislocation. Extensive vascular calcification IMPRESSION: Acute mildly displaced left femoral neck fracture. Probable chronic fracture deformity of the left inferior pubic ramus. Electronically Signed   By: Donavan Foil M.D.   On: 02/14/2020 17:34    Left Hip hemiarthroplasty    Subjective: Patient was seen and examined at bedside.  Overnight events noted.  Patient reports feeling slightly better,  pain is better controlled.   He denies dizziness and wants to be discharged.  Discharge Exam: Vitals:   02/20/20 0738 02/20/20 0939  BP: (!) 142/65 (!) 142/65  Pulse: 64   Resp: 18   Temp: 98.6 F (37 C)   SpO2: 98%    Vitals:   02/19/20 1932 02/20/20 0300 02/20/20 0738 02/20/20 0939  BP: 131/62  123/79 (!) 142/65 (!) 142/65  Pulse: 87 91 64   Resp: 16  18   Temp: 98.6 F (37 C) 98.6 F (37 C) 98.6 F (37 C)   TempSrc: Oral Oral Oral   SpO2: 98% 98% 98%   Weight:      Height:        General: Pt is alert, awake, not in acute distress Cardiovascular: RRR, S1/S2 +, no rubs, no gallops Respiratory: CTA bilaterally, no wheezing, no rhonchi Abdominal: Soft, NT, ND, bowel sounds + Extremities:  Left Leg tenderness, S/p ORIF    The results of significant diagnostics from this hospitalization (including imaging, microbiology, ancillary and laboratory) are listed  below for reference.     Microbiology: Recent Results (from the past 240 hour(s))  Respiratory Panel by RT PCR (Flu A&B, Covid) - Nasopharyngeal Swab     Status: None   Collection Time: 02/14/20  4:32 PM   Specimen: Nasopharyngeal Swab  Result Value Ref Range Status   SARS Coronavirus 2 by RT PCR NEGATIVE NEGATIVE Final    Comment: (NOTE) SARS-CoV-2 target nucleic acids are NOT DETECTED.  The SARS-CoV-2 RNA is generally detectable in upper respiratoy specimens during the acute phase of infection. The lowest concentration of SARS-CoV-2 viral copies this assay can detect is 131 copies/mL. A negative result does not preclude SARS-Cov-2 infection and should not be used as the sole basis for treatment or other patient management decisions. A negative result may occur with  improper specimen collection/handling, submission of specimen other than nasopharyngeal swab, presence of viral mutation(s) within the areas targeted by this assay, and inadequate number of viral copies (<131 copies/mL). A negative result must be combined with clinical observations, patient history, and epidemiological information. The expected result is Negative.  Fact Sheet for Patients:  PinkCheek.be  Fact Sheet for Healthcare Providers:  GravelBags.it  This test is no t yet approved  or cleared by the [place] FDA and  has been authorized for detection and/or diagnosis of SARS-CoV-2 by FDA under an Emergency Use Authorization (EUA). This EUA will remain  in effect (meaning this test can be used) for the duration of the COVID-19 declaration under Section 564(b)(1) of the Act, 21 U.S.C. section 360bbb-3(b)(1), unless the authorization is terminated or revoked sooner.     Influenza A by PCR NEGATIVE NEGATIVE Final   Influenza B by PCR NEGATIVE NEGATIVE Final    Comment: (NOTE) The Xpert Xpress SARS-CoV-2/FLU/RSV assay is intended as an aid in  the diagnosis of influenza from Nasopharyngeal swab specimens and  should not be used as a sole basis for treatment. Nasal washings and  aspirates are unacceptable for Xpert Xpress SARS-CoV-2/FLU/RSV  testing.  Fact Sheet for Patients: PinkCheek.be  Fact Sheet for Healthcare Providers: GravelBags.it  This test is not yet approved or cleared by the [place] FDA and  has been authorized for detection and/or diagnosis of SARS-CoV-2 by  FDA under an Emergency Use Authorization (EUA). This EUA will remain  in effect (meaning this test can be used) for the duration of the  Covid-19 declaration under Section 564(b)(1) of the Act, 21  U.S.C. section 360bbb-3(b)(1), unless the authorization is  terminated or revoked. Performed at [facility], [place] [address], [place], [place] [address]      Labs: BNP (last 3 results) No results for input(s): BNP in the last 8760 hours. Basic Metabolic Panel: Recent Labs  Lab 02/16/20 0149 02/16/20 0149 02/16/20 1029 02/17/20 0238 02/18/20 0027 02/19/20 0118 02/20/20 0258  NA 139  --   --  138 138 138 138  K 5.2*   < > 4.4 4.3 4.3 4.2 4.4  CL 107  --   --  107 107 107 106  CO2 26  --   --  24 24 25 22   GLUCOSE 189*  --   --  181* 164* 141* 106*  BUN 17  --   --  23 29* 30* 33*  CREATININE 1.65*  --   --  1.82*  1.69* 1.59* 1.48*  CALCIUM 8.5*  --   --  8.4* 8.4* 8.5* 8.6*  MG  --   --   --   --  1.8  --  2.0  PHOS  --   --   --   --  2.8  --  2.9   < > = values in this interval not displayed.   Liver Function Tests: Recent Labs  Lab 02/14/20 1631  AST 15  ALT 12  ALKPHOS 89  BILITOT 0.6  PROT 6.3*  ALBUMIN 3.5   No results for input(s): LIPASE, AMYLASE in the last 168 hours. No results for input(s): AMMONIA in the last 168 hours. CBC: Recent Labs  Lab 02/14/20 1631 02/14/20 1631 02/15/20 0300 02/15/20 0300 02/16/20 0149 02/16/20 1029 02/17/20 0238 02/17/20 0844 02/18/20 0027 02/18/20 1232 02/19/20 0118 02/19/20 1315 02/20/20 0258  WBC 7.5  --  12.9*  --  15.3*  --  9.6  --  11.7*  --   --   --   --   NEUTROABS 4.6  --   --   --   --   --   --   --   --   --   --   --   --   HGB 12.1*   < > 11.5*   < > 8.9*   < > 7.8*   < > 8.0* 8.2* 7.6* 7.8* 7.9*  HCT 38.1*   < > 36.1*   < > 27.8*   < > 24.3*   < > 24.6* 25.6* 23.4* 24.8* 24.6*  MCV 104.1*  --  101.4*  --  103.0*  --  103.4*  --  98.8  --   --   --   --   PLT 287  --  264  --  225  --  189  --  153  --   --   --   --    < > = values in this interval not displayed.   Cardiac Enzymes: No results for input(s): CKTOTAL, CKMB, CKMBINDEX, TROPONINI in the last 168 hours. BNP: Invalid input(s): POCBNP CBG: Recent Labs  Lab 02/19/20 1207 02/19/20 1754 02/19/20 2014 02/20/20 0632 02/20/20 1114  GLUCAP 189* 120* 125* 108* 110*   D-Dimer No results for input(s): DDIMER in the last 72 hours. Hgb A1c No results for input(s): HGBA1C in the last 72 hours. Lipid Profile No results for input(s): CHOL, HDL, LDLCALC, TRIG, CHOLHDL, LDLDIRECT in the last 72 hours. Thyroid function studies No results for input(s): TSH, T4TOTAL, T3FREE, THYROIDAB in the last 72 hours.  Invalid input(s): FREET3 Anemia work up No results for input(s): VITAMINB12, FOLATE, FERRITIN, TIBC, IRON, RETICCTPCT in the last 72 hours. Urinalysis No  results found for: COLORURINE, APPEARANCEUR, [place], [place], [place], [place], [place], [place], PROTEINUR, UROBILINOGEN, NITRITE, LEUKOCYTESUR Sepsis Labs Invalid input(s): PROCALCITONIN,  WBC,  LACTICIDVEN Microbiology Recent Results (from the past 240 hour(s))  Respiratory Panel by RT PCR (Flu A&B, Covid) - Nasopharyngeal Swab     Status: None   Collection Time: 02/14/20  4:32 PM   Specimen: Nasopharyngeal Swab  Result Value Ref Range Status   SARS Coronavirus 2 by RT PCR NEGATIVE NEGATIVE Final    Comment: (NOTE) SARS-CoV-2 target nucleic acids are NOT DETECTED.  The SARS-CoV-2 RNA is generally detectable in upper respiratoy specimens during the acute phase of infection. The lowest concentration of SARS-CoV-2 viral copies this assay can detect is 131 copies/mL. A negative result does not preclude SARS-Cov-2 infection and should not be used as the sole basis for treatment or other patient management decisions. A negative result may occur with  improper specimen collection/handling, submission of  specimen other than nasopharyngeal swab, presence of viral mutation(s) within the areas targeted by this assay, and inadequate number of viral copies (<131 copies/mL). A negative result must be combined with clinical observations, patient history, and epidemiological information. The expected result is Negative.  Fact Sheet for Patients:  PinkCheek.be  Fact Sheet for Healthcare Providers:  GravelBags.it  This test is no t yet approved or cleared by the [place] FDA and  has been authorized for detection and/or diagnosis of SARS-CoV-2 by FDA under an Emergency Use Authorization (EUA). This EUA will remain  in effect (meaning this test can be used) for the duration of the COVID-19 declaration under Section 564(b)(1) of the Act, 21 U.S.C. section 360bbb-3(b)(1), unless the authorization is terminated or revoked  sooner.     Influenza A by PCR NEGATIVE NEGATIVE Final   Influenza B by PCR NEGATIVE NEGATIVE Final    Comment: (NOTE) The Xpert Xpress SARS-CoV-2/FLU/RSV assay is intended as an aid in  the diagnosis of influenza from Nasopharyngeal swab specimens and  should not be used as a sole basis for treatment. Nasal washings and  aspirates are unacceptable for Xpert Xpress SARS-CoV-2/FLU/RSV  testing.  Fact Sheet for Patients: PinkCheek.be  Fact Sheet for Healthcare Providers: GravelBags.it  This test is not yet approved or cleared by the [place] FDA and  has been authorized for detection and/or diagnosis of SARS-CoV-2 by  FDA under an Emergency Use Authorization (EUA). This EUA will remain  in effect (meaning this test can be used) for the duration of the  Covid-19 declaration under Section 564(b)(1) of the Act, 21  U.S.C. section 360bbb-3(b)(1), unless the authorization is  terminated or revoked. Performed at [facility], [place] [address], [place], [place] [address]      Time coordinating discharge: Over 30 minutes  SIGNED:   Shawna Clamp, MD  [facility] 02/20/2020, 12:17 PM Pager   If 7PM-7AM, please contact night-coverage [email or web address]

## 2020-02-20 NOTE — Plan of Care (Signed)

## 2020-02-20 NOTE — TOC Progression Note (Signed)
Transition of Care [facility]) - Progression Note    Patient Details  Name: Douglas Melton MRN: [id number] Date of Birth: Sep 26, 1935  [facility]) CM/SW Contact  Sharin Mons, RN Phone Number: 02/20/2020, 11:33 AM  Clinical Narrative:    Pt will transition to [facility] SNF once insurance authorization received. NCM called Navi to learn status and Navi stated insurance authorization still under review. TOC team will continue to monitor and follow....   Expected Discharge Plan: Skilled Nursing Facility Barriers to Discharge: Insurance Authorization  Expected Discharge Plan and Services Expected Discharge Plan: [place]   Discharge Planning Services: CM Consult   Living arrangements for the past 2 months: Single Family Home                                       Social Determinants of Health (SDOH) Interventions    Readmission Risk Interventions No flowsheet data found.

## 2020-02-20 NOTE — TOC Transition Note (Addendum)
Transition of Care [facility]) - CM/SW Discharge Note   Patient Details  Name: Douglas Melton MRN: [id number] Date of Birth: 12-Apr-1936  [facility]) CM/SW Contact:  Sharin Mons, RN Phone Number: 02/20/2020, 12:28 PM   Clinical Narrative:    Patient will DC to: [facility]. Anticipated DC date: 02/20/2020 Family notified: yes Transport by: [facility] [phone]. Approval received for SNF. Doran Clay CM.  Per MD patient ready for DC today . RN, patient, patient's family, and facility notified of DC. Discharge Summary and FL2 sent to facility. RN to call report prior to discharge [phone]). DC packet on chart. Ambulance transport requested for patient.   RNCM will sign off for now as intervention is no longer needed. Please consult [place] again if new needs arise.    Final next level of care: Skilled Nursing Facility Barriers to Discharge: No Barriers Identified   Patient Goals and CMS Choice   CMS Medicare.gov Compare Post Acute Care list provided to:: Patient Choice offered to / list presented to : Patient, Spouse  Discharge Placement                       Discharge Plan and Services   Discharge Planning Services: CM Consult                                 Social Determinants of Health (SDOH) Interventions     Readmission Risk Interventions No flowsheet data found.

## 2020-03-08 DIAGNOSIS — D75839 Thrombocytosis, unspecified: Secondary | ICD-10-CM | POA: Insufficient documentation

## 2020-05-19 NOTE — Progress Notes (Signed)
Oncology Nurse Navigator Documentation  Placed introductory call to new referral patient Douglas Melton.  Introduced myself as the H&N oncology nurse navigator that works with Dr. Isidore Moos to whom he has been referred by Dr. Fontaine No of [facility].  He confirmed understanding of referral.  Briefly explained my role as his navigator, provided my contact information.   Confirmed understanding of upcoming appts to be completed by telephone.   I encouraged him to call with questions/concerns as he moves forward with appts and procedures.    He verbalized understanding of information provided, expressed appreciation for my call.   Navigator Initial Assessment . Employment Status: he is retired . Currently on FMLA / STD: no . Living Situation: He lives with his wife . Support System: wife . PCP: . PCD:na . Financial Concerns:no . Transportation Needs: no . Sensory Deficits: He reports vision loss. His wife transports him to all appointments.  . Language Barriers/Interpreter Needed:  no . Ambulation Needs: no . DME Used in Home: no . Psychosocial Needs:  no . Concerns/Needs Understanding Cancer:  addressed/answered by navigator to best of ability . Self-Expressed Needs: no   Harlow Asa RN, BSN, OCN Head & Neck Oncology Nurse [place] [facility] Phone # [phone]  Fax # [phone]

## 2020-05-20 NOTE — Progress Notes (Signed)
Histology and Location of Primary Skin Cancer:  Squamous cell carcinoma of scalp  Biopsies revealed: 05/03/2019  12/28/2019   Arn Medal presented with the following signs/symptoms: scalp lesions that have been present for years. He denied any bleeding but states some of the lesions are tender and growing.  Past/Anticipated interventions by patient's surgeon/dermatologist for current problematic lesion, if any:  05/02/2020 Dr. [name] --EXAM, ASSESSMENT & PLAN: . Problem 1: 2.8cm Central crusty nodule located on his midline mid central anterior scalp. Diagnosis: r/o SCC, r/o BCC Plan: Problem 1 - Biopsy Anatomic Location: midline mid central anterior scalp  ED&C was performed 3 times. Size of final defect: 2.6 cm Patient tolerated the procedure well. . Problem 2: 2cm Crusted nodule located on his right mid medial anterior scalp. Diagnosis: r/o BCC Plan: Problem 2 - Biopsy Anatomic Location: right mid medial anterior scalp  ED&C was performed 3 times. Size of final defect: 2.1 cm Patient tolerated the procedure well. . Problem 3: 1cm Erythematous papule located on his left inferior medial anterior scalp. Diagnosis: r/o BCC, r/o SCC Plan: Problem 2 - Biopsy Anatomic Location: left inferior medial anterior scalp  ED&C was performed 3 times. Size of final defect: 1.6 cm Patient tolerated the procedure well. . Problem 4: (5) Erythematous scaly papule located on his left mid medial anterior scalp; superior central vertex scalp; left superior medial forehead; left superior central anterior scalp; left midline mid anterior scalp. Diagnosis: Actinic Keratosis Plan: Cryotherapy today; patient educated on risks of blister and dyspigmentation. Care handout provided.  12/28/2019 Dr. [name] --EXAM, ASSESSMENT & PLAN: . Problem 1: 1.5cm crusted nodule located on his left mid central forehead. Diagnosis: r/o SCC - pt does not want to have MOHS procedure Plan:  Problem 1 - Biopsy Anatomic Location: left mid central forehead  ED&C was performed 3 times. Size of final defect: 1.8 cm Patient tolerated the procedure well. . Problem 2: 2cm crusted nodule located on his superior central vertex scalp. Diagnosis: r/o SCC Plan: Problem 2 - Biopsy Anatomic Location: superior central vertex scalp  ED&C was performed 3 times. Size of final defect: 1.7 cm Patient tolerated the procedure well. . Problem 3: 2cm crusted nodule located on his mid central vertex scalp. Plan: Problem 3 - Biopsy Anatomic Location: mid central vertex scalp  ED&C was performed 1 times. Size of final defect: 2.4 cmWent deeper, not amenable to [facility] so only did 1 pass. Will await pathology but may require Mohs. Patient at time of visit unwilling to do Mohs again but may need to convince. Patient tolerated the procedure well. . Problem 4: 1cm Hyperkeratotic papule located on his inferior central vertex scalp. Plan: Problem 4 - Biopsy Anatomic Location: inferior central vertex scalp  ED&C was performed 3 times. Size of final defect: 1.3 cm Patient tolerated the procedure well. . Problem 5: (3) Erythematous scaly papule located on his left superior medial Parietal Scalp; right superior medial Parietal Scalp; left mid medial anterior scalp. Diagnosis: Actinic Keratosis , he is resistant to doing Efudex again Sounds like he doesn't think it helped much when used in past Advised can do LN2 today but will need to recheck thicker lesions to ensure resolve Otherwise may need biopsy of lesions. Plan: Cryotherapy today; patient educated on risks of blister and dyspigmentation. Care handout provided. . Problem 6: No evidence of recurrence located on his inferior central vertex scalp. Diagnosis: Hx SCC S/p Mohs with skin graft  Past skin cancers, if any:   History of  Blistering sunburns, if any: Patient denies  SAFETY ISSUES:  Prior radiation? No  Pacemaker/ICD? No  Possible current  pregnancy? N/A  Is the patient on methotrexate? No  Current Complaints / other details:   Patient had a fall last October. He broke his left hip and required a total hip replacement. His has regained his strength and is able to ambulate without an assistive device, but his vision is very limited. He requires assistance from his wife with reading/signing documents, and navigating unfamiliar terrain. He's received both Pfizer vaccines as well as [employer] booster, and his annual flu shot.

## 2020-05-21 ENCOUNTER — Ambulatory Visit
Admission: RE | Admit: 2020-05-21 | Discharge: 2020-05-21 | Disposition: A | Discharge status: Home / Self Care | Payer: Medicare Other | Source: Ambulatory Visit | Attending: Radiation Oncology | Admitting: Radiation Oncology

## 2020-05-21 ENCOUNTER — Encounter: Payer: Self-pay | Admitting: Radiation Oncology

## 2020-05-21 DIAGNOSIS — C760 Malignant neoplasm of head, face and neck: Secondary | ICD-10-CM | POA: Insufficient documentation

## 2020-05-21 DIAGNOSIS — C4442 Squamous cell carcinoma of skin of scalp and neck: Secondary | ICD-10-CM

## 2020-05-21 NOTE — Progress Notes (Signed)
Radiation Oncology         [phone] ________________________________  Initial Outpatient Consultation by telephone.  The patient opted for telemedicine to maximize safety during the pandemic.  MyChart video was not obtainable.   Name: Douglas Melton MRN: [id number]  Date: 05/21/2020  DOB: 1935-12-14  [id number], Jama Flavors, MD  [name], MD   REFERRING PHYSICIAN: [name], MD  DIAGNOSIS:    ICD-10-CM   1. Squamous cell carcinoma of scalp  C44.42   2. SCC (squamous cell carcinoma), scalp/neck  C44.42   3. Squamous cell carcinoma of head and neck (HCC)  C76.0     Cancer Staging Squamous cell carcinoma of head and neck (HCC) Staging form: Cutaneous Carcinoma of the Head and Neck, AJCC 8th Edition - Clinical stage from 05/21/2020: Stage II (cT2, cN0, cM0) - Signed by Eppie Gibson, MD on 05/21/2020   CHIEF COMPLAINT: Here to discuss management of skin cancer  HISTORY OF PRESENT ILLNESS::Douglas Melton is a 85 y.o. male who presented with several scalp lesions that had present for years but were tender and growing. Subsequently, he was seen by Dr. [name] on 12/28/2019, who performed a biopsies that revealed well-differentiated squamous cell carcinoma. The patient did not want to have a Moh's procedure at that time.  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  Please see photos from Dr. Fontaine No.  Dr. Sarajane Jews has not seen him yet, but the patient has been told by Dr. Fontaine No that if he opts for surgery, Dr. Sarajane Jews could perform it.  Patient is interested in knowing all of his options.   History of Blistering sunburns, if any: Patient denies  SAFETY  ISSUES:  Prior radiation? No  Pacemaker/ICD? No  Possible current pregnancy? N/A  Is the patient on methotrexate? No  Current Complaints / other details:   Patient had a fall last October. He broke his left hip and required a total hip replacement. His has regained his strength and is able to ambulate without an assistive device, but his vision is very limited. He requires assistance from his wife with reading/signing documents, and navigating unfamiliar terrain. He's received both Pfizer vaccines as well as [employer] booster, and his annual flu shot.     PREVIOUS RADIATION THERAPY: No  PAST MEDICAL HISTORY:  has a past medical history of Chronic kidney disease, Diabetes mellitus without complication ([place]), Glaucoma, Hyperlipidemia, and Hypertension.    PAST SURGICAL HISTORY: Past Surgical History:  Procedure Laterality Date  . CHOLECYSTECTOMY    . TOTAL HIP ARTHROPLASTY Left 02/15/2020   Procedure: TOTAL HIP ARTHROPLASTY  ANTERIOR APPROACH;  Surgeon: Rod Can, MD;  Location: [facility];  Service: Orthopedics;  Laterality: Left;    FAMILY HISTORY: family history includes Heart disease in his father; Hypertension in his father; Other in his mother.  SOCIAL HISTORY:  reports that he has quit smoking. His smoking use included cigarettes. He has never used smokeless tobacco. He reports that he does not drink alcohol and does not use drugs.  ALLERGIES: Adhesive [tape], Fluorescein-benoxinate, and Latex  MEDICATIONS:  Current Outpatient Medications  Medication Sig Dispense Refill  . Docusate Sodium (DSS) 100 MG CAPS Take 1 capsule by mouth daily.    . ferrous sulfate 325 (65 FE) MG tablet Take 1 tablet (325  mg total) by mouth 2 (two) times daily with a meal. 30 tablet 3  . folic acid (FOLVITE) 161 MCG tablet Take 400 mcg by mouth daily.    Marland Kitchen glipiZIDE (GLUCOTROL XL) 2.5 MG 24 hr tablet Take 2.5 mg by mouth daily.    Marland Kitchen levothyroxine (SYNTHROID) 88 MCG tablet Take 88 mcg by mouth  daily.    . vitamin B-12 (CYANOCOBALAMIN) 100 MCG tablet Take 100 mcg by mouth daily.    Marland Kitchen HYDROcodone-acetaminophen (NORCO/VICODIN) 5-325 MG tablet Take 1-2 tablets by mouth every 6 (six) hours as needed for moderate pain. (Patient not taking: Reported on 05/21/2020) 40 tablet 0   No current facility-administered medications for this encounter.    REVIEW OF SYSTEMS:  Notable for that above.   PHYSICAL EXAM:  vitals were not taken for this visit.   General: Alert and oriented, in no acute distress   LABORATORY DATA:  Lab Results  Component Value Date   WBC 11.7 (H) 02/18/2020   HGB 7.9 (L) 02/20/2020   HCT 24.6 (L) 02/20/2020   MCV 98.8 02/18/2020   PLT 153 02/18/2020   CMP     Component Value Date/Time   NA 138 02/20/2020 0258   K 4.4 02/20/2020 0258   CL 106 02/20/2020 0258   CO2 22 02/20/2020 0258   GLUCOSE 106 (H) 02/20/2020 0258   BUN 33 (H) 02/20/2020 0258   CREATININE 1.48 (H) 02/20/2020 0258   CALCIUM 8.6 (L) 02/20/2020 0258   PROT 6.3 (L) 02/14/2020 1631   ALBUMIN 3.5 02/14/2020 1631   AST 15 02/14/2020 1631   ALT 12 02/14/2020 1631   ALKPHOS 89 02/14/2020 1631   BILITOT 0.6 02/14/2020 1631   GFRNONAA 47 (L) 02/20/2020 0258   GFRAA 57 (L) 11/03/2018 0234        RADIOGRAPHY: No results found.    IMPRESSION/PLAN: Skin cancer, multifocal, scalp, squamous cell histology  He also has an atypical fibroxanthoma of the scalp.  Today, I talked to the patient about the findings and work-up thus far. We discussed the patient's diagnosis of squamous cell carcinoma of the scalp and general treatment for this, highlighting the role of radiotherapy in the management. We discussed the available radiation techniques, and focused on the details of logistics and delivery.  I told him that radiation therapy would have an excellent chance of curing his cancer and that I would recommend he receive daily treatment, 5 days a week, for a total of 20 treatments.  This is a 4-week  time span.  The main side effects of treatment with skin irritation, hair loss over the scalp (though he has very little hair remaining in this area) and temporary fatigue.  I explained that the amount of radiation exposure to his brain would be very minimal and highly unlikely to cause any brain injury.  The risk of a nonhealing wound after the skin cancer falls off would be relatively low but not nonexistent.  Most patients have a very satisfactory cosmetic outcome.  The patient would like to think about his options.  I called Dr Sarajane Jews as the patient anticipates seeing him in the near future for surgical consultation.  We discussed the patient Dr. Sarajane Jews will let me know if he ends up opting for radiation therapy.  Dr. Sarajane Jews graciously agreed to outline the areas of gross disease if the patient opts for radiation planning.  He and I agree that it is prudent for me to treat the fibrous xanthoma to see if it  responds to radiation, understanding that it may be more resistant than squamous cell carcinoma and require other treatments later.  The patient also is interested in understanding his financial commitments if he undergoes radiation therapy.  I will have one of our financial advocates get in touch with him.  The patient was encouraged to ask questions that I answered to the best of my ability.   I look forward to participating in his care, as warranted.  He knows to call if he has any questions as he makes his decision.  This encounter was provided by telemedicine platform; patient desired telemedicine during pandemic precautions.  MyChart video was not available so telephone was used. The patient has given verbal consent for this type of encounter and has been advised to only accept a meeting of this type in a secure network environment. On date of service, in total, I spent 60 minutes on this encounter.   The attendants for this meeting include Eppie Gibson  and Arn Medal During  the encounter, Eppie Gibson was located at [facility] Radiation Oncology Department.  Douglas Melton was located at home.   __________________________________________   Eppie Gibson, MD  This document serves as a record of services personally performed by Eppie Gibson, MD. It was created on his behalf by Clerance Lav, a trained medical scribe. The creation of this record is based on the scribe's personal observations and the provider's statements to them. This document has been checked and approved by the attending provider.

## 2020-05-22 NOTE — Progress Notes (Signed)
Oncology Nurse Navigator Documentation  At Dr. Pearlie Oyster request I have set up Douglas Melton's radiation planning for 1/31 at 11:00 am. I have made Douglas Melton aware of the appointment and he has agreed to come as scheduled. He knows to call me if he has any further questions or concerns.   Harlow Asa RN, BSN, OCN Head & Neck Oncology Nurse [place] at [facility] Phone # [phone]  Fax # [phone]

## 2020-05-26 ENCOUNTER — Ambulatory Visit: Admission: RE | Admit: 2020-05-26 | Payer: Medicare Other | Source: Ambulatory Visit

## 2020-05-26 ENCOUNTER — Ambulatory Visit
Admission: RE | Admit: 2020-05-26 | Discharge: 2020-05-26 | Disposition: A | Discharge status: Home / Self Care | Payer: Medicare Other | Source: Ambulatory Visit | Attending: Radiation Oncology | Admitting: Radiation Oncology

## 2020-05-26 ENCOUNTER — Other Ambulatory Visit: Payer: Self-pay

## 2020-05-26 DIAGNOSIS — C4442 Squamous cell carcinoma of skin of scalp and neck: Secondary | ICD-10-CM | POA: Diagnosis not present

## 2020-05-30 DIAGNOSIS — C4442 Squamous cell carcinoma of skin of scalp and neck: Secondary | ICD-10-CM | POA: Diagnosis not present

## 2020-06-02 ENCOUNTER — Ambulatory Visit: Payer: Medicare Other

## 2020-06-02 ENCOUNTER — Ambulatory Visit: Payer: Medicare Other | Admitting: Radiation Oncology

## 2020-06-03 ENCOUNTER — Ambulatory Visit
Admission: RE | Admit: 2020-06-03 | Discharge: 2020-06-03 | Disposition: A | Discharge status: Home / Self Care | Payer: Medicare Other | Source: Ambulatory Visit | Attending: Radiation Oncology | Admitting: Radiation Oncology

## 2020-06-03 ENCOUNTER — Other Ambulatory Visit: Payer: Self-pay

## 2020-06-03 DIAGNOSIS — C4442 Squamous cell carcinoma of skin of scalp and neck: Secondary | ICD-10-CM | POA: Diagnosis not present

## 2020-06-03 MED ORDER — SONAFINE EX EMUL
1.0000 "application " | Freq: Two times a day (BID) | CUTANEOUS | Status: DC
  Administered 2020-06-03: 1 via TOPICAL

## 2020-06-03 NOTE — Progress Notes (Signed)
Pt here for patient teaching.  Completed by Maudie Mercury Smith-RN  Pt given Radiation and You booklet, skin care instructions and Sonafine.    Reviewed areas of pertinence such as fatigue, hair loss and skin changes .   Pt able to give teach back of to pat skin, use unscented/gentle soap and drink plenty of water,apply Sonafine bid and avoid applying anything to skin within 4 hours of treatment.   Pt demonstrated understanding and verbalizes understanding of information given and will contact nursing with any questions or concerns.    [email or web address]

## 2020-06-04 ENCOUNTER — Other Ambulatory Visit: Payer: Self-pay

## 2020-06-04 ENCOUNTER — Ambulatory Visit
Admission: RE | Admit: 2020-06-04 | Discharge: 2020-06-04 | Disposition: A | Discharge status: Home / Self Care | Payer: Medicare Other | Source: Ambulatory Visit | Attending: Radiation Oncology | Admitting: Radiation Oncology

## 2020-06-04 DIAGNOSIS — C4442 Squamous cell carcinoma of skin of scalp and neck: Secondary | ICD-10-CM | POA: Diagnosis not present

## 2020-06-05 ENCOUNTER — Ambulatory Visit
Admission: RE | Admit: 2020-06-05 | Discharge: 2020-06-05 | Disposition: A | Discharge status: Home / Self Care | Payer: Medicare Other | Source: Ambulatory Visit | Attending: Radiation Oncology | Admitting: Radiation Oncology

## 2020-06-05 DIAGNOSIS — C4442 Squamous cell carcinoma of skin of scalp and neck: Secondary | ICD-10-CM | POA: Diagnosis not present

## 2020-06-06 ENCOUNTER — Ambulatory Visit
Admission: RE | Admit: 2020-06-06 | Discharge: 2020-06-06 | Disposition: A | Discharge status: Home / Self Care | Payer: Medicare Other | Source: Ambulatory Visit | Attending: Radiation Oncology | Admitting: Radiation Oncology

## 2020-06-06 ENCOUNTER — Other Ambulatory Visit: Payer: Self-pay

## 2020-06-06 DIAGNOSIS — C4442 Squamous cell carcinoma of skin of scalp and neck: Secondary | ICD-10-CM | POA: Diagnosis not present

## 2020-06-09 ENCOUNTER — Ambulatory Visit
Admission: RE | Admit: 2020-06-09 | Discharge: 2020-06-09 | Disposition: A | Discharge status: Home / Self Care | Payer: Medicare Other | Source: Ambulatory Visit | Attending: Radiation Oncology | Admitting: Radiation Oncology

## 2020-06-09 ENCOUNTER — Other Ambulatory Visit: Payer: Self-pay

## 2020-06-09 DIAGNOSIS — C4442 Squamous cell carcinoma of skin of scalp and neck: Secondary | ICD-10-CM | POA: Diagnosis not present

## 2020-06-10 ENCOUNTER — Other Ambulatory Visit: Payer: Self-pay

## 2020-06-10 ENCOUNTER — Ambulatory Visit
Admission: RE | Admit: 2020-06-10 | Discharge: 2020-06-10 | Disposition: A | Discharge status: Home / Self Care | Payer: Medicare Other | Source: Ambulatory Visit | Attending: Radiation Oncology | Admitting: Radiation Oncology

## 2020-06-10 DIAGNOSIS — C4442 Squamous cell carcinoma of skin of scalp and neck: Secondary | ICD-10-CM | POA: Diagnosis not present

## 2020-06-11 ENCOUNTER — Other Ambulatory Visit: Payer: Self-pay

## 2020-06-11 ENCOUNTER — Ambulatory Visit
Admission: RE | Admit: 2020-06-11 | Discharge: 2020-06-11 | Disposition: A | Discharge status: Home / Self Care | Payer: Medicare Other | Source: Ambulatory Visit | Attending: Radiation Oncology | Admitting: Radiation Oncology

## 2020-06-11 DIAGNOSIS — C4442 Squamous cell carcinoma of skin of scalp and neck: Secondary | ICD-10-CM | POA: Diagnosis not present

## 2020-06-12 ENCOUNTER — Ambulatory Visit
Admission: RE | Admit: 2020-06-12 | Discharge: 2020-06-12 | Disposition: A | Discharge status: Home / Self Care | Payer: Medicare Other | Source: Ambulatory Visit | Attending: Radiation Oncology | Admitting: Radiation Oncology

## 2020-06-12 ENCOUNTER — Other Ambulatory Visit: Payer: Self-pay

## 2020-06-12 DIAGNOSIS — C4442 Squamous cell carcinoma of skin of scalp and neck: Secondary | ICD-10-CM | POA: Diagnosis not present

## 2020-06-13 ENCOUNTER — Other Ambulatory Visit: Payer: Self-pay

## 2020-06-13 ENCOUNTER — Ambulatory Visit
Admission: RE | Admit: 2020-06-13 | Discharge: 2020-06-13 | Disposition: A | Discharge status: Home / Self Care | Payer: Medicare Other | Source: Ambulatory Visit | Attending: Radiation Oncology | Admitting: Radiation Oncology

## 2020-06-13 DIAGNOSIS — C4442 Squamous cell carcinoma of skin of scalp and neck: Secondary | ICD-10-CM | POA: Diagnosis not present

## 2020-06-16 ENCOUNTER — Ambulatory Visit
Admission: RE | Admit: 2020-06-16 | Discharge: 2020-06-16 | Disposition: A | Discharge status: Home / Self Care | Payer: Medicare Other | Source: Ambulatory Visit | Attending: Radiation Oncology | Admitting: Radiation Oncology

## 2020-06-16 DIAGNOSIS — C4442 Squamous cell carcinoma of skin of scalp and neck: Secondary | ICD-10-CM | POA: Diagnosis not present

## 2020-06-17 ENCOUNTER — Ambulatory Visit
Admission: RE | Admit: 2020-06-17 | Discharge: 2020-06-17 | Disposition: A | Discharge status: Home / Self Care | Payer: Medicare Other | Source: Ambulatory Visit | Attending: Radiation Oncology | Admitting: Radiation Oncology

## 2020-06-17 DIAGNOSIS — C4442 Squamous cell carcinoma of skin of scalp and neck: Secondary | ICD-10-CM | POA: Diagnosis not present

## 2020-06-18 ENCOUNTER — Ambulatory Visit
Admission: RE | Admit: 2020-06-18 | Discharge: 2020-06-18 | Disposition: A | Discharge status: Home / Self Care | Payer: Medicare Other | Source: Ambulatory Visit | Attending: Radiation Oncology | Admitting: Radiation Oncology

## 2020-06-18 ENCOUNTER — Other Ambulatory Visit: Payer: Self-pay

## 2020-06-18 DIAGNOSIS — C4442 Squamous cell carcinoma of skin of scalp and neck: Secondary | ICD-10-CM | POA: Diagnosis not present

## 2020-06-19 ENCOUNTER — Other Ambulatory Visit: Payer: Self-pay

## 2020-06-19 ENCOUNTER — Ambulatory Visit
Admission: RE | Admit: 2020-06-19 | Discharge: 2020-06-19 | Disposition: A | Discharge status: Home / Self Care | Payer: Medicare Other | Source: Ambulatory Visit | Attending: Radiation Oncology | Admitting: Radiation Oncology

## 2020-06-19 DIAGNOSIS — C4442 Squamous cell carcinoma of skin of scalp and neck: Secondary | ICD-10-CM | POA: Diagnosis not present

## 2020-06-20 ENCOUNTER — Other Ambulatory Visit: Payer: Self-pay

## 2020-06-20 ENCOUNTER — Ambulatory Visit
Admission: RE | Admit: 2020-06-20 | Discharge: 2020-06-20 | Disposition: A | Discharge status: Home / Self Care | Payer: Medicare Other | Source: Ambulatory Visit | Attending: Radiation Oncology | Admitting: Radiation Oncology

## 2020-06-20 DIAGNOSIS — C4442 Squamous cell carcinoma of skin of scalp and neck: Secondary | ICD-10-CM | POA: Diagnosis not present

## 2020-06-23 ENCOUNTER — Other Ambulatory Visit: Payer: Self-pay

## 2020-06-23 ENCOUNTER — Ambulatory Visit
Admission: RE | Admit: 2020-06-23 | Discharge: 2020-06-23 | Disposition: A | Discharge status: Home / Self Care | Payer: Medicare Other | Source: Ambulatory Visit | Attending: Radiation Oncology | Admitting: Radiation Oncology

## 2020-06-23 DIAGNOSIS — C4442 Squamous cell carcinoma of skin of scalp and neck: Secondary | ICD-10-CM | POA: Diagnosis not present

## 2020-06-24 ENCOUNTER — Other Ambulatory Visit: Payer: Self-pay

## 2020-06-24 ENCOUNTER — Ambulatory Visit
Admission: RE | Admit: 2020-06-24 | Discharge: 2020-06-24 | Disposition: A | Discharge status: Home / Self Care | Payer: Medicare Other | Source: Ambulatory Visit | Attending: Radiation Oncology | Admitting: Radiation Oncology

## 2020-06-24 DIAGNOSIS — C4442 Squamous cell carcinoma of skin of scalp and neck: Secondary | ICD-10-CM | POA: Diagnosis present

## 2020-06-25 ENCOUNTER — Other Ambulatory Visit: Payer: Self-pay

## 2020-06-25 ENCOUNTER — Ambulatory Visit
Admission: RE | Admit: 2020-06-25 | Discharge: 2020-06-25 | Disposition: A | Discharge status: Home / Self Care | Payer: Medicare Other | Source: Ambulatory Visit | Attending: Radiation Oncology | Admitting: Radiation Oncology

## 2020-06-25 DIAGNOSIS — C4442 Squamous cell carcinoma of skin of scalp and neck: Secondary | ICD-10-CM | POA: Diagnosis not present

## 2020-06-26 ENCOUNTER — Other Ambulatory Visit: Payer: Self-pay

## 2020-06-26 ENCOUNTER — Ambulatory Visit
Admission: RE | Admit: 2020-06-26 | Discharge: 2020-06-26 | Disposition: A | Discharge status: Home / Self Care | Payer: Medicare Other | Source: Ambulatory Visit | Attending: Radiation Oncology | Admitting: Radiation Oncology

## 2020-06-26 DIAGNOSIS — C4442 Squamous cell carcinoma of skin of scalp and neck: Secondary | ICD-10-CM | POA: Diagnosis not present

## 2020-06-27 ENCOUNTER — Ambulatory Visit: Payer: Medicare Other

## 2020-06-27 ENCOUNTER — Ambulatory Visit
Admission: RE | Admit: 2020-06-27 | Discharge: 2020-06-27 | Disposition: A | Discharge status: Home / Self Care | Payer: Medicare Other | Source: Ambulatory Visit | Attending: Radiation Oncology | Admitting: Radiation Oncology

## 2020-06-27 DIAGNOSIS — C4442 Squamous cell carcinoma of skin of scalp and neck: Secondary | ICD-10-CM | POA: Diagnosis not present

## 2020-06-30 ENCOUNTER — Encounter: Payer: Self-pay | Admitting: Radiation Oncology

## 2020-06-30 ENCOUNTER — Ambulatory Visit
Admission: RE | Admit: 2020-06-30 | Discharge: 2020-06-30 | Disposition: A | Discharge status: Home / Self Care | Payer: Medicare Other | Source: Ambulatory Visit | Attending: Radiation Oncology | Admitting: Radiation Oncology

## 2020-06-30 DIAGNOSIS — C4442 Squamous cell carcinoma of skin of scalp and neck: Secondary | ICD-10-CM | POA: Diagnosis not present

## 2020-07-04 ENCOUNTER — Ambulatory Visit: Payer: Self-pay | Admitting: Radiation Oncology

## 2020-07-07 NOTE — Progress Notes (Addendum)
Oncology Nurse Navigator Documentation  I returned Douglas Melton phone call from earlier today regarding worsening skin changes to the area that was treated with radiation which completed on 06/30/20. He reports a scab to the area with yellowish/green exudate around the area. He denies a fever or foul odor from the site. I encouraged him to continue applying sonafine as directed to skin that is dry and antibiotic ointment to areas of his skin that may be oozing and to minimize irritation to the area to promote healing.  I explained that the area treated with radiation will often become more irritated and the skin changes will get worse immediately after completing treatment and if it doesn't start to improve by the end of this week to call me back to let me know.   Harlow Asa RN, BSN, OCN Head & Neck Oncology Nurse [place] at [facility] Phone # [phone]  Fax # [phone]

## 2020-08-15 ENCOUNTER — Encounter: Payer: Self-pay | Admitting: Radiation Oncology

## 2020-08-15 ENCOUNTER — Other Ambulatory Visit: Payer: Self-pay

## 2020-08-15 ENCOUNTER — Ambulatory Visit
Admission: RE | Admit: 2020-08-15 | Discharge: 2020-08-15 | Disposition: A | Discharge status: Home / Self Care | Payer: Medicare Other | Source: Ambulatory Visit | Attending: Radiation Oncology | Admitting: Radiation Oncology

## 2020-08-15 VITALS — BP 142/67 | HR 92 | Temp 97.5°F | Resp 18 | Ht 74.0 in | Wt 149.6 lb

## 2020-08-15 DIAGNOSIS — C4442 Squamous cell carcinoma of skin of scalp and neck: Secondary | ICD-10-CM

## 2020-08-15 DIAGNOSIS — Z923 Personal history of irradiation: Secondary | ICD-10-CM | POA: Diagnosis not present

## 2020-08-15 DIAGNOSIS — Z7984 Long term (current) use of oral hypoglycemic drugs: Secondary | ICD-10-CM | POA: Insufficient documentation

## 2020-08-15 DIAGNOSIS — Z79899 Other long term (current) drug therapy: Secondary | ICD-10-CM | POA: Insufficient documentation

## 2020-08-15 DIAGNOSIS — C760 Malignant neoplasm of head, face and neck: Secondary | ICD-10-CM

## 2020-08-15 NOTE — Progress Notes (Signed)
Douglas Melton presents today for follow-up after completing radiation to his scalp on 06/30/2020  Pain issues, if any: Patient denies Weight changes, if any: Reports a healthy appetite  Wt Readings from Last 3 Encounters:  08/15/20 149 lb 9.6 oz (67.9 kg)  02/15/20 150 lb (68 kg)  11/04/18 143 lb 11.8 oz (65.2 kg)   Skin: Treatment area remains scabbed over. No longer weeping but scab remains firmly in place. He reports area occasionally itches, but is not painful or bothersome otherwise Dermatology F/U?: Nothing scheduled as of yet, but plans to reach out to practice if scab doesn't loosen soon Other notable issues, if any: Denies any headaches or dizziness. Vision impairment remains unchanged, but denies any new issues or concerns. Overall reports he feels well and is pleased with his recovery thus far  Vitals:   08/15/20 1445  BP: (!) 142/67  Pulse: 92  Resp: 18  Temp: (!) 97.5 F (36.4 C)  SpO2: 100%

## 2020-08-15 NOTE — Progress Notes (Signed)
Radiation Oncology         [phone]) [phone] ________________________________  Name: Douglas Melton MRN: [id number]  Date: 08/15/2020  DOB: Sep 01, 1935  Follow-Up Visit Note  Outpatient  CC: Douglas Sacramento, MD  [name], MD  Diagnosis and Prior Radiotherapy:    ICD-10-CM   1. Squamous cell carcinoma of scalp  C44.42   2. Squamous cell carcinoma of head and neck (HCC)  C76.0     CHIEF COMPLAINT: Here for follow-up and surveillance of scalp cancer  Narrative:  The patient returns today for routine follow-up.   Douglas Melton presents today for follow-up after completing radiation to his scalp on 06/30/2020  Pain issues, if any: Patient denies Weight changes, if any: Reports a healthy appetite  Wt Readings from Last 3 Encounters:  08/15/20 149 lb 9.6 oz (67.9 kg)  02/15/20 150 lb (68 kg)  11/04/18 143 lb 11.8 oz (65.2 kg)   Skin: Treatment area remains scabbed over. No longer weeping but scab remains firmly in place. He reports area occasionally itches, but is not painful or bothersome otherwise Dermatology F/U?: Nothing scheduled as of yet, but plans to reach out to practice if scab doesn't loosen soon Other notable issues, if any: Denies any headaches or dizziness. Vision impairment remains unchanged, but denies any new issues or concerns. Overall reports he feels well and is pleased with his recovery thus far  Vitals:   08/15/20 1445  BP: (!) 142/67  Pulse: 92  Resp: 18  Temp: (!) 97.5 F (36.4 C)  SpO2: 100%                                  ALLERGIES:  is allergic to adhesive [tape], fluorescein-benoxinate, and latex.  Meds: Current Outpatient Medications  Medication Sig Dispense Refill  . Docusate Sodium (DSS) 100 MG CAPS Take 1 capsule by mouth daily.    . ferrous sulfate 325 (65 FE) MG tablet Take 1 tablet (325 mg total) by mouth 2 (two) times daily with a meal. 30 tablet 3  . folic acid (FOLVITE) 322 MCG tablet Take 400 mcg by mouth daily.    Marland Kitchen glipiZIDE  (GLUCOTROL XL) 2.5 MG 24 hr tablet Take 2.5 mg by mouth daily.    Marland Kitchen HYDROcodone-acetaminophen (NORCO/VICODIN) 5-325 MG tablet Take 1-2 tablets by mouth every 6 (six) hours as needed for moderate pain. (Patient not taking: Reported on 05/21/2020) 40 tablet 0  . levothyroxine (SYNTHROID) 88 MCG tablet Take 88 mcg by mouth daily.    . vitamin B-12 (CYANOCOBALAMIN) 100 MCG tablet Take 100 mcg by mouth daily.     No current facility-administered medications for this encounter.    Physical Findings: The patient is in no acute distress. Patient is alert and oriented.  height is 6\' 2"  (1.88 m) and weight is 149 lb 9.6 oz (67.9 kg). His temperature is 97.5 F (36.4 C) (abnormal). His blood pressure is 142/67 (abnormal) and his pulse is 92. His respiration is 18 and oxygen saturation is 100%. [name] within RT field of scalp; there is raised nodularity that is firm under the eschar     Lab Findings: Lab Results  Component Value Date   WBC 11.7 (H) 02/18/2020   HGB 7.9 (L) 02/20/2020   HCT 24.6 (L) 02/20/2020   MCV 98.8 02/18/2020   PLT 153 02/18/2020    Radiographic Findings: No results found.  Impression/Plan:   I  recommended that he let the eschar fall off on its own.  He does not need to apply any more lotion to the treatment field.  I recommend that he follow-up with Dr. Sarajane Jews in 4 weeks.  The patient and his wife will call Dr. Shinn Graff clinic.  I also made a personal call to Dr. Sarajane Jews and shared the follow-up of with him.  I will see the patient back in 6 months.  He will follow with dermatology as above to make sure that his healing is satisfactory and that he does not show any evidence of progressive or persistent disease.  On date of service, in total, I spent 25 minutes on this encounter. Patient was seen in person.  _____________________________________   Eppie Gibson, MD

## 2020-08-25 NOTE — Progress Notes (Signed)
  Patient Name: Douglas Melton MRN: [id number] DOB: Oct 05, 1935 Referring Physician: [name] (Profile Not Attached) Date of Service: 06/30/2020 [facility] [place]                                                        End Of Treatment Note  Diagnoses: C44.42-Squamous cell carcinoma of skin of scalp and neck  Cancer Staging: Cancer Staging Squamous cell carcinoma of head and neck ([place]) Staging form: Cutaneous Carcinoma of the Head and Neck, AJCC 8th Edition - Clinical stage from 05/21/2020: Stage II (cT2, cN0, cM0) - Signed by Eppie Gibson, MD on 05/21/2020 Stage prefix: Initial diagnosis Extraosseous extension: Absent  Intent: Curative  Radiation Treatment Dates: 06/03/2020 through 06/30/2020 Site Technique Total Dose (Gy) Dose per Fx (Gy) Completed Fx Beam Energies  Scalp: HN_scalp specialPort 50/50 2.5 20/20 6E, 9E   Narrative: The patient tolerated radiation therapy relatively well.   Plan: The patient will follow-up with radiation oncology in 49mo. -----------------------------------  Eppie Gibson, MD

## 2021-02-20 ENCOUNTER — Encounter: Payer: Self-pay | Admitting: Radiation Oncology

## 2021-02-20 ENCOUNTER — Ambulatory Visit
Admission: RE | Admit: 2021-02-20 | Discharge: 2021-02-20 | Disposition: A | Discharge status: Home / Self Care | Payer: Medicare Other | Source: Ambulatory Visit | Attending: Radiation Oncology | Admitting: Radiation Oncology

## 2021-02-20 VITALS — BP 177/80 | HR 86 | Temp 98.2°F | Resp 18 | Ht 74.0 in | Wt 150.0 lb

## 2021-02-20 DIAGNOSIS — Z7984 Long term (current) use of oral hypoglycemic drugs: Secondary | ICD-10-CM | POA: Insufficient documentation

## 2021-02-20 DIAGNOSIS — C4442 Squamous cell carcinoma of skin of scalp and neck: Secondary | ICD-10-CM | POA: Insufficient documentation

## 2021-02-20 DIAGNOSIS — Z79899 Other long term (current) drug therapy: Secondary | ICD-10-CM | POA: Diagnosis not present

## 2021-02-20 DIAGNOSIS — C760 Malignant neoplasm of head, face and neck: Secondary | ICD-10-CM

## 2021-02-20 DIAGNOSIS — Z923 Personal history of irradiation: Secondary | ICD-10-CM | POA: Diagnosis not present

## 2021-02-20 NOTE — Progress Notes (Signed)
Patient states doing well. No symptoms reported at this time.  Dermatology follow-up scheduled for December, 2022 -per patient.  Meaningful use complete.  BP (!) 177/80 (BP Location: Left Arm, Patient Position: Sitting, Cuff Size: Normal)   Pulse 86   Temp 98.2 F (36.8 C) (Temporal)   Resp 18   Ht 6\' 2"  (1.88 m)   Wt 150 lb (68 kg)   SpO2 100%   BMI 19.26 kg/m

## 2021-02-23 NOTE — Progress Notes (Signed)
Radiation Oncology         [phone]) [phone] ________________________________  Name: Douglas Melton MRN: [id number]  Date: 02/20/2021  DOB: 1935-09-12  Follow-Up Visit Note  Outpatient  CC: Douglas Sacramento, MD  Douglas Levans, MD  Diagnosis and Prior Radiotherapy:    ICD-10-CM   1. 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 Squamous cell carcinoma of head and neck (HCC)  C76.0       CHIEF COMPLAINT: Here for follow-up and surveillance of scalp cancer  Narrative:   Patient states doing well. No symptoms reported at this time.  Dermatology follow-up scheduled for December, 2022 -per patient.  Meaningful use complete.  BP (!) 177/80 (BP Location: Left Arm, Patient Position: Sitting, Cuff Size: Normal)   Pulse 86   Temp 98.2 F (36.8 C) (Temporal)   Resp 18   Ht 6\' 2"  (1.88 m)   Wt 150 lb (68 kg)   SpO2 100%   BMI 19.26 kg/m                                 ALLERGIES:  is allergic to adhesive [tape], fluorescein-benoxinate, and latex.  Meds: Current Outpatient Medications  Medication Sig Dispense Refill   Docusate Sodium (DSS) 100 MG CAPS Take 1 capsule by mouth daily.     ferrous sulfate 325 (65 FE) MG tablet Take 1 tablet (325 mg total) by mouth 2 (two) times daily with a meal. 30 tablet 3   folic acid (FOLVITE) 993 MCG tablet Take 400 mcg by mouth daily.     glipiZIDE (GLUCOTROL XL) 2.5 MG 24 hr tablet Take 2.5 mg by mouth daily.     HYDROcodone-acetaminophen (NORCO/VICODIN) 5-325 MG tablet Take 1-2 tablets by mouth every 6 (six) hours as needed for moderate pain. (Patient not taking: Reported on 05/21/2020) 40 tablet 0   levothyroxine (SYNTHROID) 88 MCG tablet Take 88 mcg by mouth daily.     vitamin B-12 (CYANOCOBALAMIN) 100 MCG tablet Take 100 mcg by mouth daily.     No current facility-administered medications for this encounter.    Physical Findings: The patient is in no acute distress. Patient is alert and oriented.  height is 6\' 2"  (1.88 m) and  weight is 150 lb (68 kg). His temporal temperature is 98.2 F (36.8 C). His blood pressure is 177/80 (abnormal) and his pulse is 86. His respiration is 18 and oxygen saturation is 100%. Douglas Melton has resolved over scalp.  There are some superficial scabs remaining, and left anterior one has a pustular component.   PHOTO FROM APRIL 2022   PHOTO FROM TODAY   Lab Findings: Lab Results  Component Value Date   WBC 11.7 (H) 02/18/2020   HGB 7.9 (L) 02/20/2020   HCT 24.6 (L) 02/20/2020   MCV 98.8 02/18/2020   PLT 153 02/18/2020    Radiographic Findings: No results found.  Impression/Plan:    He has healed well from radiation therapy.  The remaining scabbed lesions over his scalp will be followed closely by dermatology.  No obvious evidence of remaining disease but I will defer to dermatology on whether any of these lesions will warrant biopsy or topical treatment moving forward.  I will see him back on an as-needed basis.  He and his wife are pleased with this plan.   On date of service, in total, I spent 15 minutes on this encounter.  Patient was seen in person.  _____________________________________   Eppie Gibson, MD

## 2022-01-04 DIAGNOSIS — E559 Vitamin D deficiency, unspecified: Secondary | ICD-10-CM | POA: Insufficient documentation

## 2022-07-19 IMAGING — DX DG PORTABLE PELVIS
1 series · 1 of 1 positions shown · non-contrast
Comparison: Radiograph same day

CLINICAL DATA: Postsurgical changes

EXAM:
PORTABLE PELVIS 1-2 VIEWS

[pelvis]
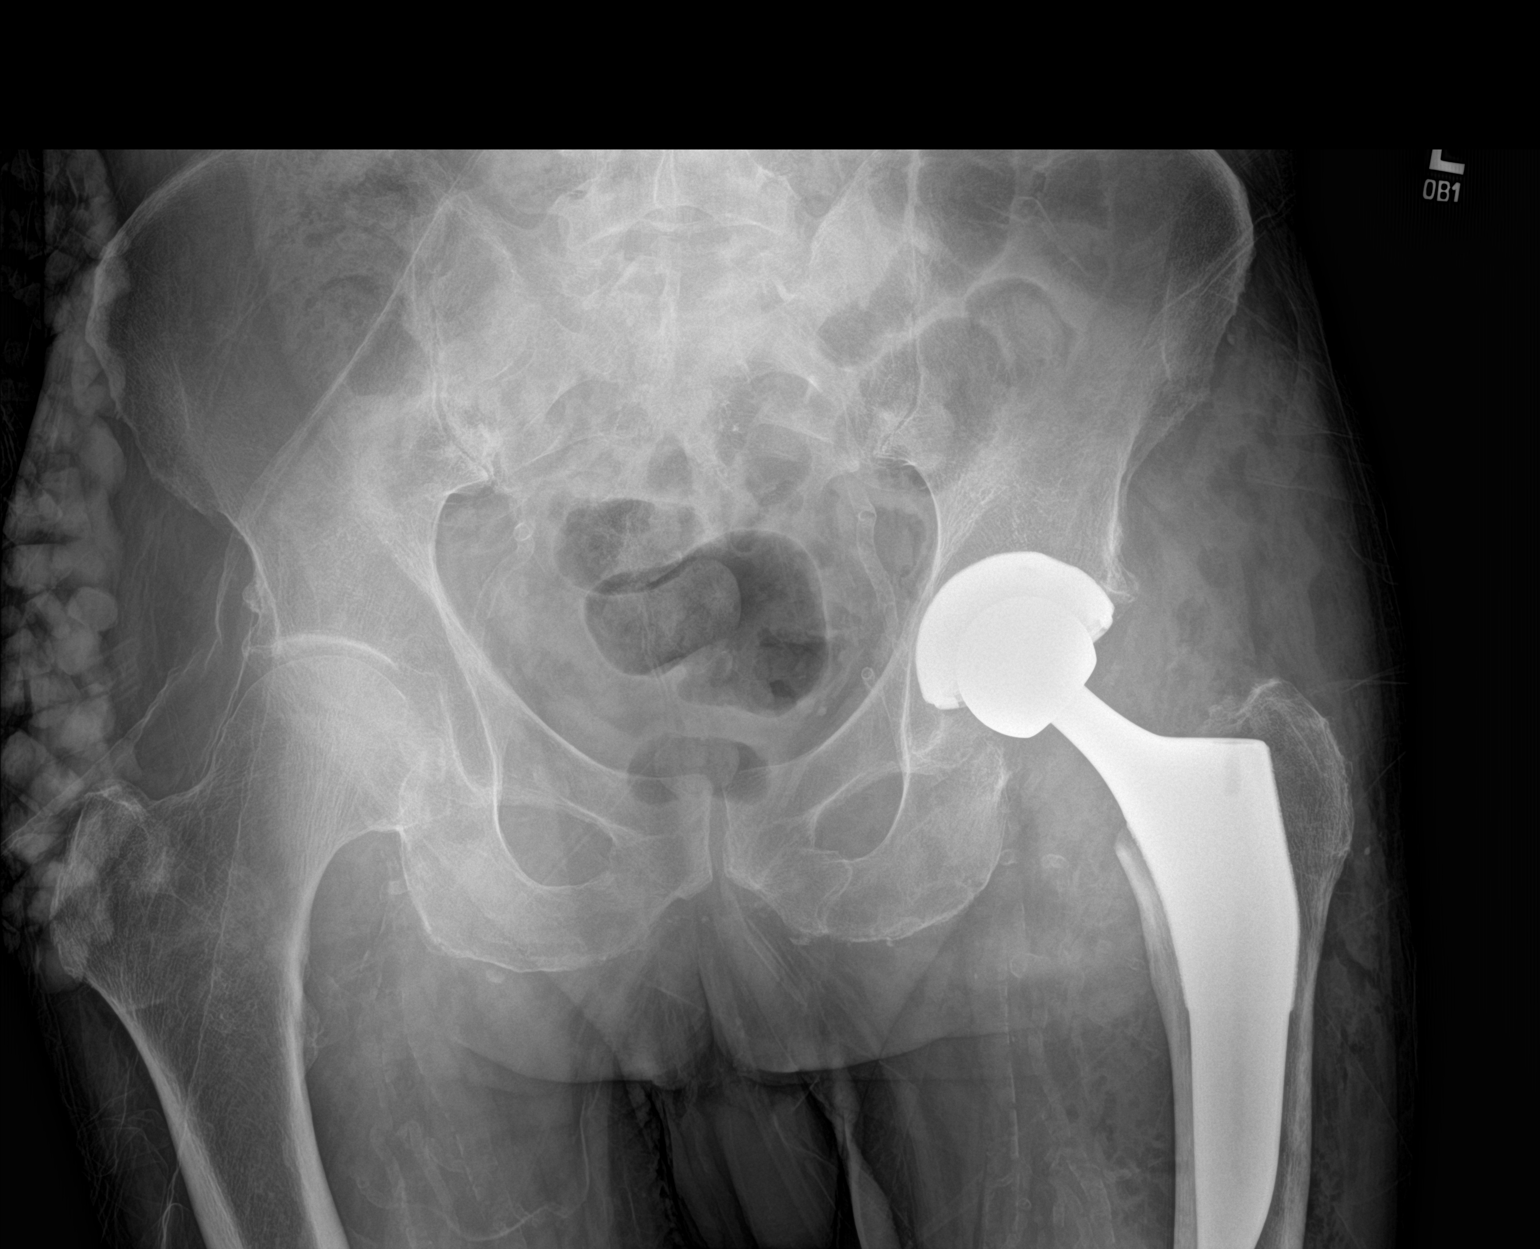

[1 of 1 positions shown; findings below may reference images not displayed]

FINDINGS: The patient is status post left total hip arthroplasty without
without a periprosthetic fracture. Is there is a probable chronic
appearing inferior pubic rami fracture on the left. Overlying
subcutaneous emphysema is seen.
IMPRESSION: Status post left total hip arthroplasty without acute complication.

## 2023-01-18 DIAGNOSIS — Z8619 Personal history of other infectious and parasitic diseases: Secondary | ICD-10-CM | POA: Insufficient documentation

## 2023-01-18 DIAGNOSIS — Z23 Encounter for immunization: Secondary | ICD-10-CM | POA: Insufficient documentation

## 2023-03-01 ENCOUNTER — Observation Stay (HOSPITAL_COMMUNITY): Payer: Medicare Other

## 2023-03-01 ENCOUNTER — Inpatient Hospital Stay (HOSPITAL_COMMUNITY)
Admission: EM | Admit: 2023-03-01 | Discharge: 2023-03-07 | DRG: 100 | Disposition: A | Discharge status: Rehabilitation Facility | Payer: Medicare Other | Attending: Family Medicine | Admitting: Family Medicine

## 2023-03-01 ENCOUNTER — Other Ambulatory Visit: Payer: Self-pay

## 2023-03-01 ENCOUNTER — Encounter (HOSPITAL_COMMUNITY): Payer: Self-pay | Admitting: Pharmacy Technician

## 2023-03-01 ENCOUNTER — Emergency Department (HOSPITAL_COMMUNITY): Payer: Medicare Other

## 2023-03-01 DIAGNOSIS — E1159 Type 2 diabetes mellitus with other circulatory complications: Secondary | ICD-10-CM | POA: Diagnosis present

## 2023-03-01 DIAGNOSIS — I4891 Unspecified atrial fibrillation: Secondary | ICD-10-CM

## 2023-03-01 DIAGNOSIS — H409 Unspecified glaucoma: Secondary | ICD-10-CM | POA: Diagnosis present

## 2023-03-01 DIAGNOSIS — I680 Cerebral amyloid angiopathy: Secondary | ICD-10-CM | POA: Diagnosis present

## 2023-03-01 DIAGNOSIS — E785 Hyperlipidemia, unspecified: Secondary | ICD-10-CM | POA: Diagnosis present

## 2023-03-01 DIAGNOSIS — C4442 Squamous cell carcinoma of skin of scalp and neck: Secondary | ICD-10-CM | POA: Diagnosis present

## 2023-03-01 DIAGNOSIS — R030 Elevated blood-pressure reading, without diagnosis of hypertension: Secondary | ICD-10-CM

## 2023-03-01 DIAGNOSIS — N1831 Chronic kidney disease, stage 3a: Secondary | ICD-10-CM | POA: Diagnosis present

## 2023-03-01 DIAGNOSIS — Z9889 Other specified postprocedural states: Secondary | ICD-10-CM

## 2023-03-01 DIAGNOSIS — E854 Organ-limited amyloidosis: Secondary | ICD-10-CM | POA: Diagnosis present

## 2023-03-01 DIAGNOSIS — E119 Type 2 diabetes mellitus without complications: Secondary | ICD-10-CM

## 2023-03-01 DIAGNOSIS — Z85828 Personal history of other malignant neoplasm of skin: Secondary | ICD-10-CM

## 2023-03-01 DIAGNOSIS — Z5320 Procedure and treatment not carried out because of patient's decision for unspecified reasons: Secondary | ICD-10-CM | POA: Diagnosis not present

## 2023-03-01 DIAGNOSIS — Z888 Allergy status to other drugs, medicaments and biological substances status: Secondary | ICD-10-CM

## 2023-03-01 DIAGNOSIS — Z681 Body mass index (BMI) 19 or less, adult: Secondary | ICD-10-CM

## 2023-03-01 DIAGNOSIS — I639 Cerebral infarction, unspecified: Secondary | ICD-10-CM | POA: Insufficient documentation

## 2023-03-01 DIAGNOSIS — Z883 Allergy status to other anti-infective agents status: Secondary | ICD-10-CM

## 2023-03-01 DIAGNOSIS — R64 Cachexia: Secondary | ICD-10-CM | POA: Diagnosis present

## 2023-03-01 DIAGNOSIS — R627 Adult failure to thrive: Secondary | ICD-10-CM | POA: Insufficient documentation

## 2023-03-01 DIAGNOSIS — E1169 Type 2 diabetes mellitus with other specified complication: Secondary | ICD-10-CM | POA: Diagnosis present

## 2023-03-01 DIAGNOSIS — Z79890 Hormone replacement therapy (postmenopausal): Secondary | ICD-10-CM

## 2023-03-01 DIAGNOSIS — Z7984 Long term (current) use of oral hypoglycemic drugs: Secondary | ICD-10-CM

## 2023-03-01 DIAGNOSIS — Z87891 Personal history of nicotine dependence: Secondary | ICD-10-CM

## 2023-03-01 DIAGNOSIS — Z7982 Long term (current) use of aspirin: Secondary | ICD-10-CM

## 2023-03-01 DIAGNOSIS — I6523 Occlusion and stenosis of bilateral carotid arteries: Secondary | ICD-10-CM | POA: Diagnosis present

## 2023-03-01 DIAGNOSIS — R000 Tachycardia, unspecified: Secondary | ICD-10-CM | POA: Diagnosis not present

## 2023-03-01 DIAGNOSIS — E611 Iron deficiency: Secondary | ICD-10-CM | POA: Diagnosis present

## 2023-03-01 DIAGNOSIS — Z923 Personal history of irradiation: Secondary | ICD-10-CM

## 2023-03-01 DIAGNOSIS — D638 Anemia in other chronic diseases classified elsewhere: Secondary | ICD-10-CM | POA: Diagnosis present

## 2023-03-01 DIAGNOSIS — R9082 White matter disease, unspecified: Secondary | ICD-10-CM | POA: Diagnosis present

## 2023-03-01 DIAGNOSIS — N183 Chronic kidney disease, stage 3 unspecified: Secondary | ICD-10-CM | POA: Diagnosis present

## 2023-03-01 DIAGNOSIS — Z8249 Family history of ischemic heart disease and other diseases of the circulatory system: Secondary | ICD-10-CM

## 2023-03-01 DIAGNOSIS — I6502 Occlusion and stenosis of left vertebral artery: Secondary | ICD-10-CM | POA: Diagnosis present

## 2023-03-01 DIAGNOSIS — I491 Atrial premature depolarization: Secondary | ICD-10-CM | POA: Insufficient documentation

## 2023-03-01 DIAGNOSIS — Z79899 Other long term (current) drug therapy: Secondary | ICD-10-CM

## 2023-03-01 DIAGNOSIS — Z96642 Presence of left artificial hip joint: Secondary | ICD-10-CM | POA: Diagnosis present

## 2023-03-01 DIAGNOSIS — E039 Hypothyroidism, unspecified: Secondary | ICD-10-CM | POA: Diagnosis present

## 2023-03-01 DIAGNOSIS — I6340 Cerebral infarction due to embolism of unspecified cerebral artery: Secondary | ICD-10-CM | POA: Diagnosis present

## 2023-03-01 DIAGNOSIS — I152 Hypertension secondary to endocrine disorders: Secondary | ICD-10-CM | POA: Diagnosis present

## 2023-03-01 DIAGNOSIS — E1122 Type 2 diabetes mellitus with diabetic chronic kidney disease: Secondary | ICD-10-CM | POA: Diagnosis present

## 2023-03-01 DIAGNOSIS — H547 Unspecified visual loss: Secondary | ICD-10-CM

## 2023-03-01 DIAGNOSIS — Z9049 Acquired absence of other specified parts of digestive tract: Secondary | ICD-10-CM

## 2023-03-01 DIAGNOSIS — R569 Unspecified convulsions: Principal | ICD-10-CM

## 2023-03-01 DIAGNOSIS — E43 Unspecified severe protein-calorie malnutrition: Secondary | ICD-10-CM | POA: Insufficient documentation

## 2023-03-01 DIAGNOSIS — Z91048 Other nonmedicinal substance allergy status: Secondary | ICD-10-CM

## 2023-03-01 DIAGNOSIS — H543 Unqualified visual loss, both eyes: Secondary | ICD-10-CM | POA: Diagnosis present

## 2023-03-01 LAB — URINALYSIS, ROUTINE W REFLEX MICROSCOPIC
Bilirubin Urine: NEGATIVE
Glucose, UA: NEGATIVE mg/dL
Hgb urine dipstick: NEGATIVE
Ketones, ur: NEGATIVE mg/dL
Leukocytes,Ua: NEGATIVE
Nitrite: NEGATIVE
Protein, ur: NEGATIVE mg/dL
Specific Gravity, Urine: 1.019 (ref 1.005–1.030)
pH: 5 (ref 5.0–8.0)

## 2023-03-01 LAB — COMPREHENSIVE METABOLIC PANEL
ALT: 9 U/L (ref 0–44)
AST: 17 U/L (ref 15–41)
Albumin: 3.1 g/dL — ABNORMAL LOW (ref 3.5–5.0)
Alkaline Phosphatase: 83 U/L (ref 38–126)
Anion gap: 11 (ref 5–15)
BUN: 15 mg/dL (ref 8–23)
CO2: 22 mmol/L (ref 22–32)
Calcium: 9 mg/dL (ref 8.9–10.3)
Chloride: 106 mmol/L (ref 98–111)
Creatinine, Ser: 1.56 mg/dL — ABNORMAL HIGH (ref 0.61–1.24)
GFR, Estimated: 43 mL/min — ABNORMAL LOW (ref >60)
Glucose, Bld: 170 mg/dL — ABNORMAL HIGH (ref 70–99)
Potassium: 4.2 mmol/L (ref 3.5–5.1)
Sodium: 139 mmol/L (ref 135–145)
Total Bilirubin: 0.9 mg/dL (ref <1.2)
Total Protein: 6.2 g/dL — ABNORMAL LOW (ref 6.5–8.1)

## 2023-03-01 LAB — RAPID URINE DRUG SCREEN, HOSP PERFORMED
Amphetamines: NOT DETECTED
Barbiturates: NOT DETECTED
Benzodiazepines: NOT DETECTED
Cocaine: NOT DETECTED
Opiates: NOT DETECTED
Tetrahydrocannabinol: NOT DETECTED

## 2023-03-01 LAB — PROTIME-INR
INR: 1.2 (ref 0.8–1.2)
Prothrombin Time: 15.3 s — ABNORMAL HIGH (ref 11.4–15.2)

## 2023-03-01 LAB — T4, FREE: Free T4: 1.28 ng/dL — ABNORMAL HIGH (ref 0.61–1.12)

## 2023-03-01 LAB — CBC
HCT: 40.2 % (ref 39.0–52.0)
Hemoglobin: 12.9 g/dL — ABNORMAL LOW (ref 13.0–17.0)
MCH: 31.9 pg (ref 26.0–34.0)
MCHC: 32.1 g/dL (ref 30.0–36.0)
MCV: 99.3 fL (ref 80.0–100.0)
Platelets: 363 10*3/uL (ref 150–400)
RBC: 4.05 MIL/uL — ABNORMAL LOW (ref 4.22–5.81)
RDW: 13.7 % (ref 11.5–15.5)
WBC: 11.1 10*3/uL — ABNORMAL HIGH (ref 4.0–10.5)
nRBC: 0 % (ref 0.0–0.2)

## 2023-03-01 LAB — CK: Total CK: 45 U/L — ABNORMAL LOW (ref 49–397)

## 2023-03-01 LAB — MAGNESIUM: Magnesium: 1.9 mg/dL (ref 1.7–2.4)

## 2023-03-01 LAB — TSH: TSH: 3.483 u[IU]/mL (ref 0.350–4.500)

## 2023-03-01 LAB — GLUCOSE, CAPILLARY: Glucose-Capillary: 98 mg/dL (ref 70–99)

## 2023-03-01 LAB — BRAIN NATRIURETIC PEPTIDE: B Natriuretic Peptide: 291.1 pg/mL — ABNORMAL HIGH (ref 0.0–100.0)

## 2023-03-01 MED ORDER — METOPROLOL TARTRATE 25 MG PO TABS
25.0000 mg | ORAL_TABLET | Freq: Two times a day (BID) | ORAL | Status: DC
  Administered 2023-03-01 – 2023-03-07 (×12): 25 mg via ORAL
  Filled 2023-03-01 (×12): qty 1

## 2023-03-01 MED ORDER — ACETAMINOPHEN 650 MG RE SUPP: 650.0000 mg | Freq: Four times a day (QID) | RECTAL | Status: DC | PRN

## 2023-03-01 MED ORDER — LORAZEPAM 2 MG/ML IJ SOLN: 4.0000 mg | INTRAMUSCULAR | Status: DC | PRN

## 2023-03-01 MED ORDER — LEVETIRACETAM IN NACL 1500 MG/100ML IV SOLN
1500.0000 mg | Freq: Once | INTRAVENOUS | Status: AC
  Administered 2023-03-01: 1500 mg via INTRAVENOUS
  Filled 2023-03-01: qty 100

## 2023-03-01 MED ORDER — TRAMADOL HCL 50 MG PO TABS: 50.0000 mg | ORAL_TABLET | Freq: Four times a day (QID) | ORAL | Status: DC | PRN

## 2023-03-01 MED ORDER — ACETAMINOPHEN 325 MG PO TABS: 650.0000 mg | ORAL_TABLET | Freq: Four times a day (QID) | ORAL | Status: DC | PRN

## 2023-03-01 MED ORDER — LEVOTHYROXINE SODIUM 88 MCG PO TABS
88.0000 ug | ORAL_TABLET | Freq: Every day | ORAL | Status: DC
Start: 2023-03-02 — End: 2023-03-07
  Administered 2023-03-02 – 2023-03-07 (×6): 88 ug via ORAL
  Filled 2023-03-01 (×6): qty 1

## 2023-03-01 MED ORDER — FERROUS SULFATE 325 (65 FE) MG PO TABS
325.0000 mg | ORAL_TABLET | Freq: Two times a day (BID) | ORAL | Status: DC
  Administered 2023-03-02 – 2023-03-07 (×11): 325 mg via ORAL
  Filled 2023-03-01 (×13): qty 1

## 2023-03-01 MED ORDER — SODIUM CHLORIDE 0.9 % IV SOLN: INTRAVENOUS | Status: AC

## 2023-03-01 MED ORDER — INSULIN ASPART 100 UNIT/ML IJ SOLN
0.0000 [IU] | Freq: Three times a day (TID) | INTRAMUSCULAR | Status: DC
Start: 2023-03-01 — End: 2023-03-07
  Administered 2023-03-02: 1 [IU] via SUBCUTANEOUS
  Administered 2023-03-03 (×2): 2 [IU] via SUBCUTANEOUS
  Administered 2023-03-03 – 2023-03-05 (×4): 1 [IU] via SUBCUTANEOUS
  Administered 2023-03-06: 2 [IU] via SUBCUTANEOUS
  Administered 2023-03-06: 1 [IU] via SUBCUTANEOUS
  Administered 2023-03-07: 2 [IU] via SUBCUTANEOUS

## 2023-03-01 MED ORDER — LEVETIRACETAM IN NACL 500 MG/100ML IV SOLN
500.0000 mg | Freq: Two times a day (BID) | INTRAVENOUS | Status: DC
  Administered 2023-03-01 – 2023-03-03 (×4): 500 mg via INTRAVENOUS
  Filled 2023-03-01 (×4): qty 100

## 2023-03-01 MED ORDER — METOPROLOL TARTRATE 5 MG/5ML IV SOLN: 5.0000 mg | Freq: Four times a day (QID) | INTRAVENOUS | Status: DC | PRN

## 2023-03-01 NOTE — Assessment & Plan Note (Signed)
Cachetic and weak with no desire to eat  Will have PT/OT and nutrition see him

## 2023-03-01 NOTE — Assessment & Plan Note (Signed)
A1C of 5.8 in September of 2024 Hold glipizide SSI and accuchecks qac/hs

## 2023-03-01 NOTE — Assessment & Plan Note (Addendum)
87 year old presenting with sudden onset jerking of right arm concerning for focal seizures s/p POD #1 for wide local excision, outer table craniotomy by Dr. 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 Findings suggesting cerebral amyloid angiopathy (CAA) in the proper clinical setting.  -neurology consulted -he declines MRI. Discussed with Dr. Christoper Allegra, surgeon from [facility], [place] MRI if he needs  -Dr. Christoper Allegra also does not see how surgery could be related to seizures  -his right hand is contracted with right arm weakness. With new onset atrial fib, consider MRI.discussed with neurology and will try again after done with EEG.  -long term EEG -loaded with keppra and continuing per neurology  -seizure precautions

## 2023-03-01 NOTE — H&P (Signed)
History and Physical    Patient: Douglas Melton QIO:[id number] DOB: [birth year] DOA: 03/01/2023 DOS: the patient was seen and examined on 03/01/2023 PCP: Barbie Banner, MD  Patient coming from: Home - lives with his wife, is blind but can get around house.     Chief Complaint: seizure like activity   HPI: Douglas Melton is a 87 y.o. male with medical history significant of CKD, T2DM, HTN, HLD, hypothyroidism, tobacco abuse, blindness, hx of SCC and fibroxanthoma of the scalp with recent wide local excision, outer table craniectomy by Dr. Christoper Allegra on 02/28/23 at [facility]. Since this time he has had a few episodes when his right arm goes into involuntary shaking and then is weak afterwards. He states first episode happened around 7AM this morning and right arm would violently shake for about 5 minutes. Family witnessed this episode. He was then weak afterwards. He has had 4 episodes of right arm shaking violently. He now can not really control his right arm and his right hand is contracted. He can not open it up to squeeze my hand. He is right handed. He has never had any seizures or seizure like activity before today.    He has been feeling good. Denies any fever/chills, vision changes/headaches, chest pain or palpitations, shortness of breath or cough, abdominal pain, N/V/D, dysuria or leg swelling.   He does not smoke or drink alcohol.   ER Course:  vitals: afebrile, bp: 166/70, HR; 106, RR: 14, oxygen 100%RA Pertinent labs: wbc: 11.1, creatinine: 1.56,  CT head: partially eroded outer table of the skull at the anterior vertex with overlying exophytic mixed density mass which might be postoperative dressing material. No craniotomy, no skull fracture. No intracranial abnormality.  CT cervical spine: no acute finding. Mild for age cervical spine degeneration. Calcified cervical carotid atherosclerosis.  In ED: loaded with keppra. Neurology consulted. [facility] asked to admit.     Review of Systems: As  mentioned in the history of present illness. All other systems reviewed and are negative. Past Medical History:  Diagnosis Date   Chronic kidney disease    Diabetes mellitus without complication (HCC)    Glaucoma    Hyperlipidemia    Hypertension    Past Surgical History:  Procedure Laterality Date   CHOLECYSTECTOMY     TOTAL HIP ARTHROPLASTY Left 02/15/2020   Procedure: TOTAL HIP ARTHROPLASTY  ANTERIOR APPROACH;  Surgeon: Samson Frederic, MD;  Location: [facility] OR;  Service: Orthopedics;  Laterality: Left;   Social History:  reports that he has quit smoking. His smoking use included cigarettes. He has never used smokeless tobacco. He reports that he does not drink alcohol and does not use drugs.  Allergies  Allergen Reactions   Adhesive [Tape] Rash   Fluorescein-Benoxinate Rash    FA dye (Fluress- eyes)     Valacyclovir Anxiety    Nervous, anxious and jittery.    Family History  Problem Relation Age of Onset   Heart disease Father    Hypertension Father    Other Mother     Prior to Admission medications   Medication Sig Start Date End Date Taking? Authorizing Provider  ferrous sulfate 325 (65 FE) MG tablet Take 1 tablet (325 mg total) by mouth 2 (two) times daily with a meal. 02/20/20  Yes Willeen Niece, MD  folic acid (FOLVITE) 400 MCG tablet Take 400 mcg by mouth daily.   Yes [provider]  glipiZIDE (GLUCOTROL XL) 2.5 MG 24 hr tablet Take 2.5 mg by mouth daily.  03/24/20  Yes [provider]  levothyroxine (SYNTHROID) 88 MCG tablet Take 88 mcg by mouth daily. 12/26/19  Yes [provider]  traMADol (ULTRAM) 50 MG tablet Take 50 mg by mouth every 6 (six) hours as needed for severe pain (pain score 7-10). 02/28/23 03/07/23 Yes [provider]  vitamin B-12 (CYANOCOBALAMIN) 100 MCG tablet Take 100 mcg by mouth daily.   Yes [provider]  Vitamin D, Ergocalciferol, (DRISDOL) 1.25 MG (50000 UNIT) CAPS capsule Take 50,000 Units by  mouth every 7 (seven) days. 01/04/22  Yes [provider]  celecoxib (CELEBREX) 200 MG capsule Take 200 mg by mouth 2 (two) times daily. For 28 days. Patient not taking: Reported on 03/01/2023 02/28/23 03/28/23  [provider]    Physical Exam: Vitals:   03/01/23 1500 03/01/23 1524 03/01/23 1545 03/01/23 1635  BP: (!) 149/81  (!) 168/91 (!) 154/90  Pulse: 98  87 72  Resp: 12  15 16   Temp:  98.2 F (36.8 C)  98.1 F (36.7 C)  TempSrc:  Tympanic  Axillary  SpO2: 100%  100% 99%   General:  Appears calm and comfortable and is in NAD. Cachetic  Eyes:  PERRL, EOMI, normal lids, iris. Blind  ENT:  grossly normal hearing, lips & tongue, mmm; appropriate dentition Neck:  no LAD, masses or thyromegaly; no carotid bruits Cardiovascular:  irregularly, irregular.  no m/r/g. No LE edema.  Respiratory:   CTA bilaterally with no wheezes/rales/rhonchi.  Normal respiratory effort. Abdomen:  soft, NT, ND, NABS Back:   normal alignment, no CVAT Skin:  no rash or induration seen on limited exam. Head with large dressing sewn on top  Musculoskeletal:  grossly normal tone LUE, BLE. Right upper extremity with contracted hand and weakness. 2-3/5. no bony abnormality Lower extremity:  No LE edema.  Limited foot exam with no ulcerations.  2+ distal pulses. Psychiatric:  grossly normal mood and affect, speech fluent and appropriate, AOx3 Neurologic:  CN 2-12 grossly intact, sensation intact   Radiological Exams on Admission: Independently reviewed - see discussion in A/P where applicable  CT Cervical Spine Wo Contrast  Result Date: 03/01/2023 CLINICAL DATA:  87 year old male with seizure. "Yesterday removed tumor from lining of skull". EXAM: CT CERVICAL SPINE WITHOUT CONTRAST TECHNIQUE: Multidetector CT imaging of the cervical spine was performed without intravenous contrast. Multiplanar CT image reconstructions were also generated. RADIATION DOSE REDUCTION: This exam was performed according  to the departmental dose-optimization program which includes automated exposure control, adjustment of the mA and/or kV according to patient size and/or use of iterative reconstruction technique. COMPARISON:  Head CT today. FINDINGS: Alignment: Normal cervical lordosis. Cervicothoracic junction alignment is within normal limits. Bilateral posterior element alignment is within normal limits. Skull base and vertebrae: Visualized skull base is intact. No atlanto-occipital dissociation. C1 and C2 appear intact and aligned. No acute or suspicious osseous lesion identified in the cervical spine. Soft tissues and spinal canal: No prevertebral fluid or swelling. No visible canal hematoma. Calcified cervical carotid atherosclerosis. Otherwise negative visible noncontrast neck soft tissues. Disc levels: Mild for age cervical spine degeneration. No CT evidence of spinal stenosis. Upper chest: Visible upper thoracic levels appear grossly intact. Mild apical lung scarring. IMPRESSION: 1. No acute or suspicious osseous abnormality identified in the cervical spine. 2. Mild for age cervical spine degeneration. 3. Calcified cervical carotid atherosclerosis. Electronically Signed   By: [name] M.D.   On: 03/01/2023 10:45   CT Head Wo Contrast  Result Date: 03/01/2023  CLINICAL DATA:  87 year old male with seizure. "Yesterday removed tumor from lining of skull". EXAM: CT HEAD WITHOUT CONTRAST TECHNIQUE: Contiguous axial images were obtained from the base of the skull through the vertex without intravenous contrast. RADIATION DOSE REDUCTION: This exam was performed according to the departmental dose-optimization program which includes automated exposure control, adjustment of the mA and/or kV according to patient size and/or use of iterative reconstruction technique. COMPARISON:  None Available. FINDINGS: Brain: Cerebral volume loss appears to be generalized. Dural calcifications, including symmetric tentorium involvement. No  midline shift, mass effect, or evidence of intracranial mass lesion. No ventriculomegaly. No acute intracranial hemorrhage identified. Patchy and scattered bilateral cerebral white matter hypodensity with some deep white matter capsule involvement. Deep gray nuclei and posterior fossa relatively spared. No vasogenic edema identified. No acute or chronic cortically based infarct identified. Vascular: No suspicious intracranial vascular hyperdensity. Calcified atherosclerosis at the skull base. Skull: Abnormally eroded anterior vertex, left greater than right superior frontal bones further detailed below. No craniotomy. Background bone mineralization is within normal limits, or mild skull base osteopenia. No other No acute osseous abnormality identified. Sinuses/Orbits: Visualized paranasal sinuses and mastoids are clear. Other: There is an anterior vertex exophytic, superficial mass which is inseparable from the scalp, demonstrates abnormal mixed density, and seems to be associated with erosion of the skull outer table on series 4, image 65. This bone erosion is more pronounced to the left of midline. Negative visible orbits soft tissues aside from postoperative changes. Calcified scalp vessel atherosclerosis. IMPRESSION: 1. Partially eroded outer table of the skull at the anterior vertex with overlying exophytic mixed density mass which might be postoperative dressing material (uncertain). No craniotomy. No skull fracture or full-thickness calvarium erosion. 2. No acute intracranial abnormality. Generalized cerebral volume loss and moderate for age white matter disease. Electronically Signed   By: [name] M.D.   On: 03/01/2023 10:43    EKG: Independently reviewed.  NSR with rate 111; nonspecific ST changes with no evidence of acute ischemia, PAC. LBBB (similar to previous)    Labs on Admission: I have personally reviewed the available labs and imaging studies at the time of the admission.  Pertinent labs:    wbc: 11.1,  creatinine: 1.56,  Assessment and Plan: Principal Problem:   Seizure-like activity (HCC) Active Problems:   New onset a-fib (HCC)   Squamous cell carcinoma of scalp   CKD (chronic kidney disease), stage III (HCC)   Diabetes mellitus without complication (HCC)   Hypothyroidism   Blindness   FTT (failure to thrive) in adult    Assessment and Plan: * Seizure-like activity (HCC) 87 year old presenting with sudden onset jerking of right arm concerning for focal seizures s/p POD #1 for wide local excision, outer table craniotomy by Dr. 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 Findings suggesting cerebral amyloid angiopathy (CAA) in the proper clinical setting.  -neurology consulted -he declines MRI. Discussed with Dr. Christoper Allegra, surgeon from [facility], [place] for MRI if he needs  -Dr. Christoper Allegra also does not see how surgery could be related to seizures  -his right hand is contracted with right arm weakness. With new onset atrial fib, consider MRI.discussed with neurology and will try again after done with EEG.  -long term EEG -loaded with keppra and continuing per neurology  -seizure precautions  New onset a-fib [facility]) Patient with new onset atrial fibrillation, rate controlled TSH/mag pending Echo ordered CHADS2-Vasc score of 3. Discussed AC and he declines. Discussed risk of stroke and he declines. Dicussed ASA and he declines.  I did discuss with surgeon at [facility], Dr. Christoper Allegra, who said if he needs [facility] it is fine from a surgical standpoint, just bleeding precautions  -start metoprolol 25mg  BID  -atrial fib clinic referral   Squamous cell carcinoma of scalp S/p recent wide local excision, outer table craniectomy by Dr. Christoper Allegra on 02/28/23 at [facility] for [facility] and fibroxanthoma of the scalp  [place] stays on for 7  days  Pain control with tramadol, but hasn't needed any  Discussed with Dr. Christoper Allegra that he is here   CKD (chronic kidney disease), stage III (HCC) Baseline creatinine around 1.6, stable   Diabetes mellitus without complication (HCC) A1C of 5.8 in September of 2024 Hold glipizide SSI and accuchecks qac/hs   Hypothyroidism TSH wnl in 11/2022 Continue home synthroid   Blindness Completely blind Asked for finger foods and care order for help with eating   FTT (failure to thrive) in adult Cachetic and weak with no desire to eat  Will have PT/OT and nutrition see him      Advance Care Planning:   Code Status: Full Code   Consults: neurology/ PT/OT/nutrition   DVT Prophylaxis: SCDs  Family Communication: wife and sons at bedside   Severity of Illness: The appropriate patient status for this patient is OBSERVATION. Observation status is judged to be reasonable and necessary in order to provide the required intensity of service to ensure the patient's safety. The patient's presenting symptoms, physical exam findings, and initial radiographic and laboratory data in the context of their medical condition is felt to place them at decreased risk for further clinical deterioration. Furthermore, it is anticipated that the patient will be medically stable for discharge from the hospital within 2 midnights of admission.   Author: Orland Mustard, MD 03/01/2023 6:47 PM  For on call review [email or web address].

## 2023-03-01 NOTE — ED Notes (Signed)
Per Dr. Denton Lank, since we do not have the 1500mg  dose on hand, hang 1G keppra now, will give the other 500mg  later

## 2023-03-01 NOTE — Consult Note (Signed)
NEUROLOGY CONSULT NOTE   Date of service: March 01, 2023 Patient Name: Douglas Melton MRN:  [id number] DOB:  16-Jul-1935 Chief Complaint: "Seizures involving right arm" Requesting Provider: Orland Mustard, MD  History of Present Illness  Tyree Fluharty is a 87 y.o. male  has a past medical history of Chronic kidney disease, Diabetes mellitus without complication (HCC), Glaucoma, Hyperlipidemia, and Hypertension.  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  Most recent surgery was yesterday, when scalp mass was excised, bone scrapings of skull were taken for biopsy and some sort of dermal matrix was placed.  This morning, patient noticed painful shaking of his right arm which was uncontrollable and lasted about 5 or 6 minutes.  Afterwards, he was unable to move his right arm.  He had 3 more of these episodes while in the ambulance and in the emergency department.  He states he has never had a serious head injury, has no family members with seizures and has never had meningitis or encephalitis, never had febrile seizures as a child and did not have any problems with his birth or development.  He has never had seizure before today.  MRI brain with and without contrast on 10/8 demonstrated no intracranial mass lesions to explain seizure.   ROS  Comprehensive ROS performed and pertinent positives documented in HPI   Past History   Past Medical History:  Diagnosis Date   Chronic kidney disease    Diabetes mellitus without complication (HCC)    Glaucoma    Hyperlipidemia    Hypertension     Past Surgical History:  Procedure Laterality Date   CHOLECYSTECTOMY     TOTAL HIP ARTHROPLASTY Left 02/15/2020   Procedure: TOTAL HIP ARTHROPLASTY  ANTERIOR APPROACH;  Surgeon: Samson Frederic, MD;  Location: [facility] OR;  Service: Orthopedics;  Laterality: Left;    Family History: Family History  Problem Relation Age  of Onset   Heart disease Father    Hypertension Father    Other Mother     Social History  reports that he has quit smoking. His smoking use included cigarettes. He has never used smokeless tobacco. He reports that he does not drink alcohol and does not use drugs.  Allergies  Allergen Reactions   Adhesive [Tape] Rash   Fluorescein-Benoxinate Rash    FA dye (Fluress- eyes)     Valacyclovir Anxiety    Nervous, anxious and jittery.    Medications   Current Facility-Administered Medications:    insulin aspart (novoLOG) injection 0-9 Units, 0-9 Units, Subcutaneous, TID WC, Orland Mustard, MD  Current Outpatient Medications:    ferrous sulfate 325 (65 FE) MG tablet, Take 1 tablet (325 mg total) by mouth 2 (two) times daily with a meal., Disp: 30 tablet, Rfl: 3   folic acid (FOLVITE) 400 MCG tablet, Take 400 mcg by mouth daily., Disp: , Rfl:    glipiZIDE (GLUCOTROL XL) 2.5 MG 24 hr tablet, Take 2.5 mg by mouth daily., Disp: , Rfl:    levothyroxine (SYNTHROID) 88 MCG tablet, Take 88 mcg by mouth daily., Disp: , Rfl:    traMADol (ULTRAM) 50 MG tablet, Take 50 mg by mouth every 6 (six) hours as needed for severe pain (pain score 7-10)., Disp: , Rfl:    vitamin B-12 (CYANOCOBALAMIN) 100 MCG tablet, Take 100 mcg by mouth daily., Disp: , Rfl:    Vitamin D, Ergocalciferol, (DRISDOL) 1.25 MG (50000 UNIT) CAPS capsule, Take 50,000  Units by mouth every 7 (seven) days., Disp: , Rfl:    celecoxib (CELEBREX) 200 MG capsule, Take 200 mg by mouth 2 (two) times daily. For 28 days. (Patient not taking: Reported on 03/13/23), Disp: , Rfl:   Vitals   Vitals:   03/13/23 0930 03/13/23 1100 Mar 13, 2023 1115 03-13-23 1234  BP: (!) 153/79 (!) 158/92 (!) 145/94   Pulse: 69 (!) 47 90   Resp: 18 (!) 21 19   Temp:    97.8 F (36.6 C)  SpO2: 100% 100% 100%     There is no height or weight on file to calculate BMI.  Physical Exam   Constitutional: Appears well-developed and well-nourished.  Psych: Affect  appropriate to situation.  Eyes: No scleral injection.  HENT: No OP obstruction.  Head: Normocephalic.  Cardiovascular: Normal rate and regular rhythm.  Respiratory: Effort normal, non-labored breathing.  Skin: WDI.   Neurologic Examination    NEURO:  Mental Status: Alert and oriented to person place and situation, able to state correct year but cannot recall the month Speech/Language: speech is without dysarthria or aphasia.    Cranial Nerves:  II: Blind at baseline III, IV, VI: EOMI. Eyelids elevate symmetrically.  V: Sensation is intact to light touch and symmetrical to face.  VII: Smile is symmetrical.  VIII: hearing intact to voice. IX, X: Phonation is normal.  WG:NFAOZHYQ shrug 5/5. XII: tongue is midline without fasciculations. Motor: 5/5 strength to left upper extremity and bilateral lower extremities, no movement of right upper extremity however, this may be effort dependent as patient is afraid that moving his arm will cause another seizure Tone: is normal and bulk is normal Sensation- Intact to light touch bilaterally.  Gait- deferred   Labs/Imaging/Neurodiagnostic studies   CBC:  Recent Labs  Lab March 13, 2023 0930  WBC 11.1*  HGB 12.9*  HCT 40.2  MCV 99.3  PLT 363    Basic Metabolic Panel:  Lab Results  Component Value Date   NA 139 13-Mar-2023   K 4.2 03-13-2023   CO2 22 03/13/23   GLUCOSE 170 (H) 2023/03/13   BUN 15 13-Mar-2023   CREATININE 1.56 (H) 2023/03/13   CALCIUM 9.0 Mar 13, 2023   GFRNONAA 43 (L) 03-13-2023   GFRAA 57 (L) 11/03/2018    Lipid Panel: No results found for: "LDLCALC"  HgbA1c:  Lab Results  Component Value Date   HGBA1C 5.7 (H) 02/14/2020    Urine Drug Screen: No results found for: "LABOPIA", "COCAINSCRNUR", "LABBENZ", "AMPHETMU", "THCU", "LABBARB"   Alcohol Level No results found for: "ETH"  INR  Lab Results  Component Value Date   INR 1.1 02/14/2020    APTT No results found for: "APTT"  AED levels: No results  found for: "PHENYTOIN", "ZONISAMIDE", "LAMOTRIGINE", "LEVETIRACETA"    CT Head without contrast(Personally reviewed): Partially eroded outer table of skull and anterior vertex with overlying dressing, no acute intracranial abnormality  CT cervical spine No acute abnormality defined in the cervical spine  MRI Brain with and without contrast 10/8: Enhancing hypercellular soft tissue thickening in left frontal scalp without evidence of invasion of adjacent calvarium, no intracranial pathologic enhancement to suggest metastatic disease, findings suggestive of CAA, global volume loss including advanced volume loss of bilateral hippocampal formations  Neurodiagnostics rEEG:  Pending  Impression   Bethel Gaglio is a 87 y.o. male  has a past medical history of Chronic kidney disease, Diabetes mellitus without complication (HCC), Glaucoma, Hyperlipidemia, and Hypertension.  As well as blindness and squamous cell carcinoma of the  scalp with recent resection yesterday.  This morning, patient noticed seizure-like activity with painful twitching and shaking of his right arm which lasted about 5 or 6 minutes.  Patient was unable to move his arm after this episode.  He called EMS and was brought to the hospital, and episodes recurred 3 times.  He is currently worried about moving his arm in the fear that this is going to provoke another seizure episode.  He has never had a seizure before, does not have seizure risk factors, and MRI of the brain with and without contrast taken preoperatively demonstrates no intracranial abnormality to explain seizure activity.  Patient is reluctant to get another MRI, and given that some sort of dermal matrix was placed and it is unknown whether this matrix contains silver or not, would hold off on MRI today.  Will load with Keppra and obtain long-term EEG as best as possible given large dressing on patient's scalp.  Recommendations  -Long-term EEG -Loaded with Keppra 1500  mg -Keppra 500 mg twice daily -Seizure precautions ______________________________________________________________________  Patient seen by NP and then by MD, MD to edit note as needed.  Signed,  Cortney E Ernestina Columbia , MSN, AGACNP-BC [facility] See Amion for schedule and pager information 03/01/2023 3:07 PM   Attending Neurohospitalist Addendum Patient seen and examined with APP/Resident. Agree with the history and physical as documented above. Agree with the plan as documented, which I helped formulate. I have edited the note above to reflect my full findings and recommendations. I have independently reviewed the chart, obtained history, review of systems and examined the patient.I have personally reviewed pertinent head/neck/spine imaging (CT/MRI). Please feel free to call with any questions.  Patient's presentation is most c/w seizures although I cannot see how these would have been precipitated by the surgery. Of note he has a fib and is not on [facility] 2/2 patient refusal so it is possible he had a small stroke precipitating these events. Dr. Artis Flock spoke to surgeon who said that he would be okay with him undergoing MRI.  We will continue LTM EEG for spell capture and after this we will revisit the possibility of MRI with patient.  He wants to be discharged tomorrow and I told him that I felt this would be premature.  It is unusual that he would have pain associated with these episodes but seizures remain at the top of the differential.  Therefore we will continue Keppra 500 mg twice daily.  Will continue to follow.  -- Bing Neighbors, MD [facility] [phone]  If 7pm- 7am, please page neurology on call as listed in AMION.

## 2023-03-01 NOTE — Assessment & Plan Note (Signed)
S/p recent wide local excision, outer table craniectomy by Dr. Christoper Allegra on 02/28/23 at [facility] for [facility] and fibroxanthoma of the scalp  Bolster stays on for 7 days  Pain control with tramadol, but hasn't needed any  Discussed with Dr. Christoper Allegra that he is here

## 2023-03-01 NOTE — Assessment & Plan Note (Signed)
Patient with new onset atrial fibrillation, rate controlled TSH/mag pending Echo ordered CHADS2-Vasc score of 3. Discussed AC and he declines. Discussed risk of stroke and he declines. Dicussed ASA and he declines.  I did discuss with surgeon at [facility], Dr. Christoper Allegra, who said if he needs [facility] it is fine from a surgical standpoint, just bleeding precautions  -start metoprolol 25mg  BID  -atrial fib clinic referral

## 2023-03-01 NOTE — Assessment & Plan Note (Signed)
Baseline creatinine around 1.6, stable

## 2023-03-01 NOTE — ED Notes (Signed)
ED TO INPATIENT HANDOFF REPORT  ED Nurse Name and Phone #: Burnice Oestreicher [phone]  S Name/Age/Gender Douglas Melton 87 y.o. male Room/Bed: TRACC/TRACC  Code Status   Code Status: Prior  Home/SNF/Other Home Patient oriented to: self, place, time, and situation Is this baseline? Yes   Triage Complete: Triage complete  Chief Complaint Seizure-like activity [facility]) [R56.9]  Triage Note Pt bib ems with seizure like activity. Yesterday removed tumor from inside lining of his skull, now with focal seizures to R side. Tachycardic with ems. Once seizure like activity stops, pt unable to move R arm for several minutes, then slowly regains movement.    Allergies Allergies  Allergen Reactions   Adhesive [Tape] Rash   Fluorescein-Benoxinate Rash    FA dye (Fluress- eyes)     Valacyclovir Anxiety    Nervous, anxious and jittery.    Level of Care/Admitting Diagnosis ED Disposition     ED Disposition  Admit   Condition  --   Comment  Hospital Area: [facility] [[id number]]  Level of Care: Progressive [102]  Admit to Progressive based on following criteria: NEUROLOGICAL AND NEUROSURGICAL complex patients with significant risk of instability, who do not meet ICU criteria, yet require close observation or frequent assessment (< / = every 2 - 4 hours) with medical / nursing intervention.  May place patient in [facility] at [facility] or [facility] if equivalent level of care is available:: No  Covid Evaluation: Asymptomatic - no recent exposure (last 10 days) testing not required  Diagnosis: Seizure-like activity [facility]) [[id number]]  Admitting Physician: Orland Mustard [[id number]]  Attending Physician: Orland Mustard [[id number]]          B Medical/Surgery History Past Medical History:  Diagnosis Date   Chronic kidney disease    Diabetes mellitus without complication (HCC)    Glaucoma    Hyperlipidemia    Hypertension    Past Surgical History:  Procedure Laterality Date    CHOLECYSTECTOMY     TOTAL HIP ARTHROPLASTY Left 02/15/2020   Procedure: TOTAL HIP ARTHROPLASTY  ANTERIOR APPROACH;  Surgeon: Samson Frederic, MD;  Location: [facility] OR;  Service: Orthopedics;  Laterality: Left;     A IV Location/Drains/Wounds Patient Lines/Drains/Airways Status     Active Line/Drains/Airways     Name Placement date Placement time Site Days   Peripheral IV 03/01/23 18 G Anterior;Left Forearm 03/01/23  1022  Forearm  less than 1   Incision (Closed) 02/15/20 Hip Left 02/15/20  1712  -- 1110            Intake/Output Last 24 hours No intake or output data in the 24 hours ending 03/01/23 1520  Labs/Imaging Results for orders placed or performed during the hospital encounter of 03/01/23 (from the past 48 hour(s))  Comprehensive metabolic panel     Status: Abnormal   Collection Time: 03/01/23  9:30 AM  Result Value Ref Range   Sodium 139 135 - 145 mmol/L   Potassium 4.2 3.5 - 5.1 mmol/L   Chloride 106 98 - 111 mmol/L   CO2 22 22 - 32 mmol/L   Glucose, Bld 170 (H) 70 - 99 mg/dL    Comment: Glucose reference range applies only to samples taken after fasting for at least 8 hours.   BUN 15 8 - 23 mg/dL   Creatinine, Ser 5.78 (H) 0.61 - 1.24 mg/dL   Calcium 9.0 8.9 - 46.9 mg/dL   Total Protein 6.2 (L) 6.5 - 8.1 g/dL   Albumin 3.1 (L) 3.5 -  5.0 g/dL   AST 17 15 - 41 U/L   ALT 9 0 - 44 U/L   Alkaline Phosphatase 83 38 - 126 U/L   Total Bilirubin 0.9 <1.2 mg/dL   GFR, Estimated 43 (L) >60 mL/min    Comment: (NOTE) Calculated using the CKD-EPI Creatinine Equation (2021)    Anion gap 11 5 - 15    Comment: Performed at [facility], [address], [place], [place] [address]  CBC     Status: Abnormal   Collection Time: 03/01/23  9:30 AM  Result Value Ref Range   WBC 11.1 (H) 4.0 - 10.5 K/uL   RBC 4.05 (L) 4.22 - 5.81 MIL/uL   Hemoglobin 12.9 (L) 13.0 - 17.0 g/dL   HCT 60.4 54.0 - 98.1 %   MCV 99.3 80.0 - 100.0 fL   MCH 31.9 26.0 - 34.0 pg   MCHC 32.1 30.0  - 36.0 g/dL   RDW 19.1 47.8 - 29.5 %   Platelets 363 150 - 400 K/uL   nRBC 0.0 0.0 - 0.2 %    Comment: Performed at [facility], [address] [place], [place] [address]   CT Cervical Spine Wo Contrast  Result Date: 03/01/2023 CLINICAL DATA:  87 year old male with seizure. "Yesterday removed tumor from lining of skull". EXAM: CT CERVICAL SPINE WITHOUT CONTRAST TECHNIQUE: Multidetector CT imaging of the cervical spine was performed without intravenous contrast. Multiplanar CT image reconstructions were also generated. RADIATION DOSE REDUCTION: This exam was performed according to the departmental dose-optimization program which includes automated exposure control, adjustment of the mA and/or kV according to patient size and/or use of iterative reconstruction technique. COMPARISON:  Head CT today. FINDINGS: Alignment: Normal cervical lordosis. Cervicothoracic junction alignment is within normal limits. Bilateral posterior element alignment is within normal limits. Skull base and vertebrae: Visualized skull base is intact. No atlanto-occipital dissociation. C1 and C2 appear intact and aligned. No acute or suspicious osseous lesion identified in the cervical spine. Soft tissues and spinal canal: No prevertebral fluid or swelling. No visible canal hematoma. Calcified cervical carotid atherosclerosis. Otherwise negative visible noncontrast neck soft tissues. Disc levels: Mild for age cervical spine degeneration. No CT evidence of spinal stenosis. Upper chest: Visible upper thoracic levels appear grossly intact. Mild apical lung scarring. IMPRESSION: 1. No acute or suspicious osseous abnormality identified in the cervical spine. 2. Mild for age cervical spine degeneration. 3. Calcified cervical carotid atherosclerosis. Electronically Signed   By: [name] M.D.   On: 03/01/2023 10:45   CT Head Wo Contrast  Result Date: 03/01/2023 CLINICAL DATA:  87 year old male with seizure. "Yesterday removed tumor  from lining of skull". EXAM: CT HEAD WITHOUT CONTRAST TECHNIQUE: Contiguous axial images were obtained from the base of the skull through the vertex without intravenous contrast. RADIATION DOSE REDUCTION: This exam was performed according to the departmental dose-optimization program which includes automated exposure control, adjustment of the mA and/or kV according to patient size and/or use of iterative reconstruction technique. COMPARISON:  None Available. FINDINGS: Brain: Cerebral volume loss appears to be generalized. Dural calcifications, including symmetric tentorium involvement. No midline shift, mass effect, or evidence of intracranial mass lesion. No ventriculomegaly. No acute intracranial hemorrhage identified. Patchy and scattered bilateral cerebral white matter hypodensity with some deep white matter capsule involvement. Deep gray nuclei and posterior fossa relatively spared. No vasogenic edema identified. No acute or chronic cortically based infarct identified. Vascular: No suspicious intracranial vascular hyperdensity. Calcified atherosclerosis at the skull base. Skull:  Abnormally eroded anterior vertex, left greater than right superior frontal bones further detailed below. No craniotomy. Background bone mineralization is within normal limits, or mild skull base osteopenia. No other No acute osseous abnormality identified. Sinuses/Orbits: Visualized paranasal sinuses and mastoids are clear. Other: There is an anterior vertex exophytic, superficial mass which is inseparable from the scalp, demonstrates abnormal mixed density, and seems to be associated with erosion of the skull outer table on series 4, image 65. This bone erosion is more pronounced to the left of midline. Negative visible orbits soft tissues aside from postoperative changes. Calcified scalp vessel atherosclerosis. IMPRESSION: 1. Partially eroded outer table of the skull at the anterior vertex with overlying exophytic mixed density mass  which might be postoperative dressing material (uncertain). No craniotomy. No skull fracture or full-thickness calvarium erosion. 2. No acute intracranial abnormality. Generalized cerebral volume loss and moderate for age white matter disease. Electronically Signed   By: [name] M.D.   On: 03/01/2023 10:43    Pending Labs Unresulted Labs (From admission, onward)     Start     Ordered   03/01/23 0926  Urinalysis, Routine w reflex microscopic -Urine, Clean Catch  ONCE - STAT,   URGENT       Question:  Specimen Source  Answer:  Urine, Clean Catch   03/01/23 0926            Vitals/Pain Today's Vitals   03/01/23 0930 03/01/23 1100 03/01/23 1115 03/01/23 1234  BP: (!) 153/79 (!) 158/92 (!) 145/94   Pulse: 69 (!) 47 90   Resp: 18 (!) 21 19   Temp:    97.8 F (36.6 C)  SpO2: 100% 100% 100%   PainSc:        Isolation Precautions No active isolations  Medications Medications  insulin aspart (novoLOG) injection 0-9 Units (has no administration in time range)  levETIRAcetam (KEPPRA) IVPB 500 mg/100 mL premix (has no administration in time range)  levETIRAcetam (KEPPRA) IVPB 1500 mg/ 100 mL premix (0 mg Intravenous Stopped 03/01/23 1214)    Mobility Pt blind, has help from family getting around     Focused Assessments Neuro Assessment Handoff:  Swallow screen pass?  yes         Neuro Assessment:   Neuro Checks:      Has TPA been given? No If patient is a Neuro Trauma and patient is going to OR before floor call report to 4N Charge nurse: [phone] or [phone]   R Recommendations: See Admitting Provider Note  Report given to:   Additional Notes:

## 2023-03-01 NOTE — ED Provider Notes (Signed)
[place] EMERGENCY DEPARTMENT AT [facility] Provider Note   CSN: [id number] Arrival date & time: 03/01/23  2952     History  Chief Complaint  Patient presents with   right arm shaking    Douglas Melton is a 87 y.o. male.  Pt with c/o 'convulsions of right arm'. Indicates this AM, shortly after getting up, had uncontrolled shaking of right arm that was painful. States afterwards had no use of right arm, although since has regarding some strength in arm. States had second episode in ED, and indicates each episode lasted approximately a minute. Denies hx similar episodes, denies hx seizure. 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 Family does indicate post surgery did c/o some right arm numbness yesterday. Denies hx ddd or radicular pain. No headache.   The history is provided by the patient and medical records.       Home Medications Prior to Admission medications   Medication Sig Start Date End Date Taking? Authorizing Provider  ferrous sulfate 325 (65 FE) MG tablet Take 1 tablet (325 mg total) by mouth 2 (two) times daily with a meal. 02/20/20  Yes Willeen Niece, MD  folic acid (FOLVITE) 400 MCG tablet Take 400 mcg by mouth daily.   Yes [provider]  glipiZIDE (GLUCOTROL XL) 2.5 MG 24 hr tablet Take 2.5 mg by mouth daily. 03/24/20  Yes [provider]  levothyroxine (SYNTHROID) 88 MCG tablet Take 88 mcg by mouth daily. 12/26/19  Yes [provider]  traMADol (ULTRAM) 50 MG tablet Take 50 mg by mouth every 6 (six) hours as needed for severe pain (pain score 7-10). 02/28/23 03/07/23 Yes [provider]  vitamin B-12 (CYANOCOBALAMIN) 100 MCG tablet Take 100 mcg by mouth daily.   Yes [provider]  Vitamin D, Ergocalciferol, (DRISDOL) 1.25 MG (50000 UNIT) CAPS capsule Take 50,000 Units by mouth every 7 (seven) days. 01/04/22  Yes [provider]  celecoxib  (CELEBREX) 200 MG capsule Take 200 mg by mouth 2 (two) times daily. For 28 days. Patient not taking: Reported on 03/01/2023 02/28/23 03/28/23  [provider]      Allergies    Adhesive [tape], Fluorescein-benoxinate, and Valacyclovir    Review of Systems   Review of Systems  Constitutional:  Negative for chills and fever.  HENT:  Negative for sore throat and trouble swallowing.   Eyes:  Negative for visual disturbance.  Respiratory:  Negative for cough and shortness of breath.   Cardiovascular:  Negative for chest pain.  Gastrointestinal:  Negative for abdominal pain, nausea and vomiting.  Genitourinary:  Negative for dysuria and flank pain.  Musculoskeletal:  Negative for back pain and neck pain.  Skin:  Negative for rash.  Neurological:  Negative for headaches.  Hematological:  Does not bruise/bleed easily.  Psychiatric/Behavioral:  Negative for confusion.     Physical Exam Updated Vital Signs BP (!) 145/94   Pulse 90   Temp 97.8 F (36.6 C)   Resp 19   SpO2 100%  Physical Exam Vitals and nursing note reviewed.  Constitutional:      Appearance: Normal appearance. He is well-developed.  HENT:     Head:     Comments: Surgical dressing to superior scalp in place. No surrounding cellulitis, no purulent drainage.     Nose: Nose normal.     Mouth/Throat:     Mouth: Mucous membranes are moist.     Pharynx: Oropharynx  is clear.  Eyes:     General: No scleral icterus.    Extraocular Movements: Extraocular movements intact.     Conjunctiva/sclera: Conjunctivae normal.     Pupils: Pupils are equal, round, and reactive to light.  Neck:     Vascular: No carotid bruit.     Trachea: No tracheal deviation.     Comments: No stiffness or rigidity Cardiovascular:     Rate and Rhythm: Tachycardia present. Rhythm irregular.     Pulses: Normal pulses.     Heart sounds: Normal heart sounds. No murmur heard.    No friction rub. No gallop.  Pulmonary:     Effort: Pulmonary  effort is normal. No accessory muscle usage or respiratory distress.     Breath sounds: Normal breath sounds.  Abdominal:     General: There is no distension.     Palpations: Abdomen is soft.     Tenderness: There is no abdominal tenderness.  Genitourinary:    Comments: No cva tenderness. Musculoskeletal:        General: No swelling or tenderness.     Cervical back: Normal range of motion and neck supple. No rigidity.     Right lower leg: No edema.     Left lower leg: No edema.     Comments: C spine non tender, aligned. Good passive rom RUE without pain. No RUE edema. Radial pulse 2+.   Skin:    General: Skin is warm and dry.     Findings: No rash.  Neurological:     Mental Status: He is alert.     Comments: Alert, speech clear. Mild weakness and pronator drift RUE. At rest, fingers of right hand mildly flexed, but is able to transiently straighten with intentional effort. Otherwise, bil motor/sens fxn intact, stre 5/5.   Psychiatric:        Mood and Affect: Mood normal.     ED Results / Procedures / Treatments   Labs (all labs ordered are listed, but only abnormal results are displayed) Results for orders placed or performed during the hospital encounter of 03/01/23  Comprehensive metabolic panel  Result Value Ref Range   Sodium 139 135 - 145 mmol/L   Potassium 4.2 3.5 - 5.1 mmol/L   Chloride 106 98 - 111 mmol/L   CO2 22 22 - 32 mmol/L   Glucose, Bld 170 (H) 70 - 99 mg/dL   BUN 15 8 - 23 mg/dL   Creatinine, Ser 9.52 (H) 0.61 - 1.24 mg/dL   Calcium 9.0 8.9 - 84.1 mg/dL   Total Protein 6.2 (L) 6.5 - 8.1 g/dL   Albumin 3.1 (L) 3.5 - 5.0 g/dL   AST 17 15 - 41 U/L   ALT 9 0 - 44 U/L   Alkaline Phosphatase 83 38 - 126 U/L   Total Bilirubin 0.9 <1.2 mg/dL   GFR, Estimated 43 (L) >60 mL/min   Anion gap 11 5 - 15  CBC  Result Value Ref Range   WBC 11.1 (H) 4.0 - 10.5 K/uL   RBC 4.05 (L) 4.22 - 5.81 MIL/uL   Hemoglobin 12.9 (L) 13.0 - 17.0 g/dL   HCT 32.4 40.1 - 02.7 %    MCV 99.3 80.0 - 100.0 fL   MCH 31.9 26.0 - 34.0 pg   MCHC 32.1 30.0 - 36.0 g/dL   RDW 25.3 66.4 - 40.3 %   Platelets 363 150 - 400 K/uL   nRBC 0.0 0.0 - 0.2 %     EKG EKG  Interpretation Date/Time:  Tuesday March 01 2023 08:47:10 EST Ventricular Rate:  111 PR Interval:    QRS Duration:  133 QT Interval:  363 QTC Calculation: 494 R Axis:   78  Text Interpretation: Sinus rhythm Premature atrial complexes Left bundle branch block Non-specific ST-t changes Confirmed by Cathren Laine ([id number]) on 03/01/2023 10:42:39 AM  Radiology CT Cervical Spine Wo Contrast  Result Date: 03/01/2023 CLINICAL DATA:  87 year old male with seizure. "Yesterday removed tumor from lining of skull". EXAM: CT CERVICAL SPINE WITHOUT CONTRAST TECHNIQUE: Multidetector CT imaging of the cervical spine was performed without intravenous contrast. Multiplanar CT image reconstructions were also generated. RADIATION DOSE REDUCTION: This exam was performed according to the departmental dose-optimization program which includes automated exposure control, adjustment of the mA and/or kV according to patient size and/or use of iterative reconstruction technique. COMPARISON:  Head CT today. FINDINGS: Alignment: Normal cervical lordosis. Cervicothoracic junction alignment is within normal limits. Bilateral posterior element alignment is within normal limits. Skull base and vertebrae: Visualized skull base is intact. No atlanto-occipital dissociation. C1 and C2 appear intact and aligned. No acute or suspicious osseous lesion identified in the cervical spine. Soft tissues and spinal canal: No prevertebral fluid or swelling. No visible canal hematoma. Calcified cervical carotid atherosclerosis. Otherwise negative visible noncontrast neck soft tissues. Disc levels: Mild for age cervical spine degeneration. No CT evidence of spinal stenosis. Upper chest: Visible upper thoracic levels appear grossly intact. Mild apical lung scarring.  IMPRESSION: 1. No acute or suspicious osseous abnormality identified in the cervical spine. 2. Mild for age cervical spine degeneration. 3. Calcified cervical carotid atherosclerosis. Electronically Signed   By: [name] M.D.   On: 03/01/2023 10:45   CT Head Wo Contrast  Result Date: 03/01/2023 CLINICAL DATA:  87 year old male with seizure. "Yesterday removed tumor from lining of skull". EXAM: CT HEAD WITHOUT CONTRAST TECHNIQUE: Contiguous axial images were obtained from the base of the skull through the vertex without intravenous contrast. RADIATION DOSE REDUCTION: This exam was performed according to the departmental dose-optimization program which includes automated exposure control, adjustment of the mA and/or kV according to patient size and/or use of iterative reconstruction technique. COMPARISON:  None Available. FINDINGS: Brain: Cerebral volume loss appears to be generalized. Dural calcifications, including symmetric tentorium involvement. No midline shift, mass effect, or evidence of intracranial mass lesion. No ventriculomegaly. No acute intracranial hemorrhage identified. Patchy and scattered bilateral cerebral white matter hypodensity with some deep white matter capsule involvement. Deep gray nuclei and posterior fossa relatively spared. No vasogenic edema identified. No acute or chronic cortically based infarct identified. Vascular: No suspicious intracranial vascular hyperdensity. Calcified atherosclerosis at the skull base. Skull: Abnormally eroded anterior vertex, left greater than right superior frontal bones further detailed below. No craniotomy. Background bone mineralization is within normal limits, or mild skull base osteopenia. No other No acute osseous abnormality identified. Sinuses/Orbits: Visualized paranasal sinuses and mastoids are clear. Other: There is an anterior vertex exophytic, superficial mass which is inseparable from the scalp, demonstrates abnormal mixed density, and seems to  be associated with erosion of the skull outer table on series 4, image 65. This bone erosion is more pronounced to the left of midline. Negative visible orbits soft tissues aside from postoperative changes. Calcified scalp vessel atherosclerosis. IMPRESSION: 1. Partially eroded outer table of the skull at the anterior vertex with overlying exophytic mixed density mass which might be postoperative dressing material (uncertain). No craniotomy. No skull fracture or full-thickness calvarium erosion. 2. No acute intracranial abnormality.  Generalized cerebral volume loss and moderate for age white matter disease. Electronically Signed   By: [name] M.D.   On: 03/01/2023 10:43    Procedures Procedures    Medications Ordered in ED Medications  levETIRAcetam (KEPPRA) IVPB 1500 mg/ 100 mL premix (1,500 mg Intravenous New Bag/Given 03/01/23 1159)    ED Course/ Medical Decision Making/ A&P                                 Medical Decision Making Problems Addressed: Elevated blood pressure reading: acute illness or injury Seizure [facility]): acute illness or injury with systemic symptoms that poses a threat to life or bodily functions Squamous cell carcinoma, scalp/neck: chronic illness or injury with exacerbation, progression, or side effects of treatment that poses a threat to life or bodily functions  Amount and/or Complexity of Data Reviewed Independent Historian: EMS    Details: Ems, fam, hx External Data Reviewed: notes. Labs: ordered. Decision-making details documented in ED Course. Radiology: ordered and independent interpretation performed. Decision-making details documented in ED Course. ECG/medicine tests: ordered and independent interpretation performed. Decision-making details documented in ED Course. Discussion of management or test interpretation with external provider(s): Neurology, medicine  Risk Prescription drug management. Decision regarding hospitalization.   Iv ns. Continuous  pulse ox and cardiac monitoring. Labs ordered/sent. Imaging ordered.   Differential diagnosis includes seizure, radiculopathy, nerve impingement, etc. Dispo decision including potential need for admission considered - will get labs and imaging and reassess.   Reviewed nursing notes and prior charts for additional history. External reports reviewed. Additional history from: EMS, family.  Cardiac monitor: sinus rhythm, rate 108.   Labs reviewed/interpreted by me - wbc 11, hct 40, chem unremarkable.   CT reviewed/interpreted by me - no acute intracranial process. Post op scc resection changes noted.   Pt with episode in ED of involuntary shaking of RUE, witnessed by RN. By time I got to room, no shaking activity/sz activity noted, pt alert, oriented, but RUE is weaker/less function than prior.   ?whether possibly when pt c/o right arm numbness yesterday, focal cva and now w focal seizures, or new/idiopathic seizures, vs other etiology. Neurology consulted. Discussed w Dr Selina Cooley - she request keppra load iv and they will see. Keppra 1500 mg iv.   Dr Selina Cooley will get eec monitoring and mri, and indicates admit to hospitalist. Hospitalist consulted for admission.  CRITICAL CARE RE: acute seizure/convulsion w subsequent weakness, parenteral keppra administration.  Performed by: Suzi Roots Total critical care time: 45 minutes Critical care time was exclusive of separately billable procedures and treating other patients. Critical care was necessary to treat or prevent imminent or life-threatening deterioration. Critical care was time spent personally by me on the following activities: development of treatment plan with patient and/or surrogate as well as nursing, discussions with consultants, evaluation of patient's response to treatment, examination of patient, obtaining history from patient or surrogate, ordering and performing treatments and interventions, ordering and review of laboratory studies,  ordering and review of radiographic studies, pulse oximetry and re-evaluation of patient's condition.            Final Clinical Impression(s) / ED Diagnoses Final diagnoses:  Seizure (HCC)  Elevated blood pressure reading  Squamous cell carcinoma, scalp/neck    Rx / DC Orders ED Discharge Orders     None         Cathren Laine, MD 03/01/23 1348

## 2023-03-01 NOTE — Assessment & Plan Note (Signed)
TSH wnl in 11/2022 Continue home synthroid

## 2023-03-01 NOTE — Assessment & Plan Note (Addendum)
Completely blind Asked for finger foods and care order for help with eating

## 2023-03-01 NOTE — ED Notes (Signed)
1000mg  IV keppra started at this time.

## 2023-03-01 NOTE — ED Triage Notes (Signed)
Pt bib ems with seizure like activity. Yesterday removed tumor from inside lining of his skull, now with focal seizures to R side. Tachycardic with ems. Once seizure like activity stops, pt unable to move R arm for several minutes, then slowly regains movement.

## 2023-03-01 NOTE — Progress Notes (Signed)
LTM EEG hooked up and running - no initial skin breakdown - push button tested - [facility] monitoring.  

## 2023-03-02 ENCOUNTER — Observation Stay (HOSPITAL_COMMUNITY): Payer: Medicare Other

## 2023-03-02 DIAGNOSIS — R5381 Other malaise: Secondary | ICD-10-CM | POA: Diagnosis not present

## 2023-03-02 DIAGNOSIS — I4891 Unspecified atrial fibrillation: Secondary | ICD-10-CM | POA: Diagnosis not present

## 2023-03-02 DIAGNOSIS — E1122 Type 2 diabetes mellitus with diabetic chronic kidney disease: Secondary | ICD-10-CM | POA: Diagnosis present

## 2023-03-02 DIAGNOSIS — I6340 Cerebral infarction due to embolism of unspecified cerebral artery: Secondary | ICD-10-CM | POA: Diagnosis present

## 2023-03-02 DIAGNOSIS — Z681 Body mass index (BMI) 19 or less, adult: Secondary | ICD-10-CM | POA: Diagnosis not present

## 2023-03-02 DIAGNOSIS — H409 Unspecified glaucoma: Secondary | ICD-10-CM | POA: Diagnosis present

## 2023-03-02 DIAGNOSIS — E43 Unspecified severe protein-calorie malnutrition: Secondary | ICD-10-CM | POA: Insufficient documentation

## 2023-03-02 DIAGNOSIS — E611 Iron deficiency: Secondary | ICD-10-CM | POA: Diagnosis present

## 2023-03-02 DIAGNOSIS — R569 Unspecified convulsions: Secondary | ICD-10-CM | POA: Diagnosis present

## 2023-03-02 DIAGNOSIS — I639 Cerebral infarction, unspecified: Secondary | ICD-10-CM | POA: Diagnosis not present

## 2023-03-02 DIAGNOSIS — D044 Carcinoma in situ of skin of scalp and neck: Secondary | ICD-10-CM | POA: Diagnosis not present

## 2023-03-02 DIAGNOSIS — I152 Hypertension secondary to endocrine disorders: Secondary | ICD-10-CM | POA: Diagnosis present

## 2023-03-02 DIAGNOSIS — I69398 Other sequelae of cerebral infarction: Secondary | ICD-10-CM | POA: Diagnosis not present

## 2023-03-02 DIAGNOSIS — E039 Hypothyroidism, unspecified: Secondary | ICD-10-CM | POA: Diagnosis present

## 2023-03-02 DIAGNOSIS — E119 Type 2 diabetes mellitus without complications: Secondary | ICD-10-CM | POA: Diagnosis not present

## 2023-03-02 DIAGNOSIS — G40909 Epilepsy, unspecified, not intractable, without status epilepticus: Secondary | ICD-10-CM | POA: Diagnosis not present

## 2023-03-02 DIAGNOSIS — R001 Bradycardia, unspecified: Secondary | ICD-10-CM | POA: Diagnosis not present

## 2023-03-02 DIAGNOSIS — R627 Adult failure to thrive: Secondary | ICD-10-CM | POA: Diagnosis present

## 2023-03-02 DIAGNOSIS — I6502 Occlusion and stenosis of left vertebral artery: Secondary | ICD-10-CM | POA: Diagnosis present

## 2023-03-02 DIAGNOSIS — R64 Cachexia: Secondary | ICD-10-CM | POA: Diagnosis present

## 2023-03-02 DIAGNOSIS — C4442 Squamous cell carcinoma of skin of scalp and neck: Secondary | ICD-10-CM | POA: Diagnosis present

## 2023-03-02 DIAGNOSIS — E785 Hyperlipidemia, unspecified: Secondary | ICD-10-CM | POA: Diagnosis present

## 2023-03-02 DIAGNOSIS — I491 Atrial premature depolarization: Secondary | ICD-10-CM | POA: Diagnosis present

## 2023-03-02 DIAGNOSIS — Z794 Long term (current) use of insulin: Secondary | ICD-10-CM | POA: Diagnosis not present

## 2023-03-02 DIAGNOSIS — E1169 Type 2 diabetes mellitus with other specified complication: Secondary | ICD-10-CM | POA: Diagnosis present

## 2023-03-02 DIAGNOSIS — R000 Tachycardia, unspecified: Secondary | ICD-10-CM | POA: Diagnosis not present

## 2023-03-02 DIAGNOSIS — N1831 Chronic kidney disease, stage 3a: Secondary | ICD-10-CM | POA: Diagnosis present

## 2023-03-02 DIAGNOSIS — E854 Organ-limited amyloidosis: Secondary | ICD-10-CM | POA: Diagnosis present

## 2023-03-02 DIAGNOSIS — I63413 Cerebral infarction due to embolism of bilateral middle cerebral arteries: Secondary | ICD-10-CM | POA: Diagnosis not present

## 2023-03-02 DIAGNOSIS — I680 Cerebral amyloid angiopathy: Secondary | ICD-10-CM | POA: Diagnosis present

## 2023-03-02 DIAGNOSIS — R9082 White matter disease, unspecified: Secondary | ICD-10-CM | POA: Diagnosis present

## 2023-03-02 DIAGNOSIS — I6523 Occlusion and stenosis of bilateral carotid arteries: Secondary | ICD-10-CM | POA: Diagnosis present

## 2023-03-02 DIAGNOSIS — D638 Anemia in other chronic diseases classified elsewhere: Secondary | ICD-10-CM | POA: Diagnosis present

## 2023-03-02 DIAGNOSIS — H543 Unqualified visual loss, both eyes: Secondary | ICD-10-CM | POA: Diagnosis present

## 2023-03-02 LAB — ECHOCARDIOGRAM COMPLETE
AR max vel: 2.62 cm2
AV Area VTI: 3.03 cm2
AV Area mean vel: 2.62 cm2
AV Mean grad: 1.7 mm[Hg]
AV Peak grad: 3 mm[Hg]
Ao pk vel: 0.87 m/s
Area-P 1/2: 4.64 cm2
S' Lateral: 3 cm

## 2023-03-02 LAB — BASIC METABOLIC PANEL
Anion gap: 10 (ref 5–15)
BUN: 16 mg/dL (ref 8–23)
CO2: 24 mmol/L (ref 22–32)
Calcium: 8.6 mg/dL — ABNORMAL LOW (ref 8.9–10.3)
Chloride: 105 mmol/L (ref 98–111)
Creatinine, Ser: 1.49 mg/dL — ABNORMAL HIGH (ref 0.61–1.24)
GFR, Estimated: 46 mL/min — ABNORMAL LOW (ref >60)
Glucose, Bld: 82 mg/dL (ref 70–99)
Potassium: 3.7 mmol/L (ref 3.5–5.1)
Sodium: 139 mmol/L (ref 135–145)

## 2023-03-02 LAB — CBC
HCT: 37.6 % — ABNORMAL LOW (ref 39.0–52.0)
Hemoglobin: 12 g/dL — ABNORMAL LOW (ref 13.0–17.0)
MCH: 31.7 pg (ref 26.0–34.0)
MCHC: 31.9 g/dL (ref 30.0–36.0)
MCV: 99.2 fL (ref 80.0–100.0)
Platelets: 309 10*3/uL (ref 150–400)
RBC: 3.79 MIL/uL — ABNORMAL LOW (ref 4.22–5.81)
RDW: 14 % (ref 11.5–15.5)
WBC: 10.7 10*3/uL — ABNORMAL HIGH (ref 4.0–10.5)
nRBC: 0 % (ref 0.0–0.2)

## 2023-03-02 LAB — GLUCOSE, CAPILLARY
Glucose-Capillary: 72 mg/dL (ref 70–99)
Glucose-Capillary: 77 mg/dL (ref 70–99)
Glucose-Capillary: 133 mg/dL — ABNORMAL HIGH (ref 70–99)
Glucose-Capillary: 90 mg/dL (ref 70–99)
Glucose-Capillary: 182 mg/dL — ABNORMAL HIGH (ref 70–99)

## 2023-03-02 MED ORDER — WHITE PETROLATUM EX OINT
TOPICAL_OINTMENT | Freq: Every day | CUTANEOUS | Status: DC
  Administered 2023-03-02 – 2023-03-07 (×6): 0.2 via TOPICAL
  Filled 2023-03-02 (×3): qty 28.35

## 2023-03-02 MED ORDER — ENSURE ENLIVE PO LIQD
237.0000 mL | Freq: Three times a day (TID) | ORAL | Status: DC
  Administered 2023-03-02 – 2023-03-05 (×3): 237 mL via ORAL

## 2023-03-02 NOTE — Evaluation (Signed)
Occupational Therapy Evaluation Patient Details Name: Douglas Melton MRN: [id number] DOB: May 30, 1935 Today's Date: 03/02/2023   History of Present Illness Pt is an 87 y/o M admitted on 03/01/23 after presenting with c/o sudden onset jerking of RUE concerning for focal seizures. 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 Lives with spouse. Pt currently with RUE deficits, needing set up -max A for ADLs, minA for bed mobility and min A for standing EOB without AD. Pt feeling "very weak" and unable to progress further. Pt provided with yellow squeeze foam for RUE and encouraged use of improved strength/ROM in R hand. Pt presenting with impairments listed below, will follow acutely. Patient will benefit from intensive inpatient follow up therapy, >3 hours/day to maximize safety/ind with ADLs/functional mobility.        If plan is discharge home, recommend the following: A lot of help with bathing/dressing/bathroom;A little help with walking and/or transfers;Direct supervision/assist for medications management;Direct supervision/assist for financial management;Assist for transportation;Help with stairs or ramp for entrance;Assistance with cooking/housework    Functional Status Assessment  Patient has had a recent decline in their functional status and demonstrates the ability to make significant improvements in function in a reasonable and predictable amount of time.  Equipment Recommendations  Other (comment) (defer)    Recommendations for Other Services PT consult;Rehab consult     Precautions / Restrictions Precautions Precautions: Fall Precaution Comments: blind; EEG Restrictions Weight Bearing Restrictions: No      Mobility Bed Mobility Overal bed mobility: Needs  Assistance Bed Mobility: Supine to Sit, Sit to Supine     Supine to sit: Contact guard Sit to supine: Min assist        Transfers Overall transfer level: Needs assistance Equipment used: None Transfers: Sit to/from Stand Sit to Stand: Min assist                  Balance Overall balance assessment: Needs assistance Sitting-balance support: Feet supported Sitting balance-Leahy Scale: Good     Standing balance support: Bilateral upper extremity supported Standing balance-Leahy Scale: Poor                             ADL either performed or assessed with clinical judgement   ADL Overall ADL's : Needs assistance/impaired Eating/Feeding: Set up   Grooming: Set up;Sitting   Upper Body Bathing: Minimal assistance;Sitting   Lower Body Bathing: Moderate assistance;Sitting/lateral leans   Upper Body Dressing : Minimal assistance;Sitting   Lower Body Dressing: Moderate assistance;Sitting/lateral leans   Toilet Transfer: Minimal assistance;Moderate assistance   Toileting- Clothing Manipulation and Hygiene: Maximal assistance       Functional mobility during ADLs: Minimal assistance;Moderate assistance       Vision Baseline Vision/History: 2 Legally blind;3 Glaucoma;4 Cataracts Additional Comments: reports is able to see bright light but cannot identify items in visual field     Perception Perception: Not tested       Praxis Praxis: Not tested       Pertinent Vitals/Pain Pain Assessment Pain Assessment: No/denies pain     Extremity/Trunk Assessment Upper Extremity Assessment Upper Extremity Assessment: RUE deficits/detail RUE Deficits / Details: limited shoulder ROM, can hold against gravity but not against resistance, can flex/ext elbow and wrist, 2/5 grasp, cannot form full fist  Lower Extremity Assessment Lower Extremity Assessment: Generalized weakness   Cervical / Trunk Assessment Cervical / Trunk Assessment:  (recent scalp sx  (02/28/23))   Communication Communication Communication: No apparent difficulties   Cognition Arousal: Alert Behavior During Therapy: WFL for tasks assessed/performed Overall Cognitive Status: Within Functional Limits for tasks assessed                                       General Comments  VSS    Exercises     Shoulder Instructions      Home Living Family/patient expects to be discharged to:: Private residence Living Arrangements: Spouse/significant other Available Help at Discharge: Family Type of Home: House Home Access: Other (comment)     Home Layout: Multi-level;Able to live on main level with bedroom/bathroom Alternate Level Stairs-Number of Steps: stair lift from basement to main level   Bathroom Shower/Tub: Producer, television/film/video: Handicapped height     Home Equipment: Pharmacist, hospital (2 wheels);Grab bars - tub/shower          Prior Functioning/Environment Prior Level of Function : Independent/Modified Independent             Mobility Comments: Ambulatory in home without AD, furniture walks PRN, uses stair lift to access main level from basement. ADLs Comments: ind        OT Problem List: Decreased strength;Decreased range of motion;Decreased activity tolerance;Impaired balance (sitting and/or standing);Impaired vision/perception;Decreased cognition;Decreased safety awareness;Impaired UE functional use      OT Treatment/Interventions: Self-care/ADL training;Therapeutic exercise;Energy conservation;DME and/or AE instruction;Therapeutic activities;Balance training;Patient/family education;Cognitive remediation/compensation;Visual/perceptual remediation/compensation    OT Goals(Current goals can be found in the care plan section) Acute Rehab OT Goals Patient Stated Goal: none stated OT Goal Formulation: With patient Time For Goal Achievement: 03/16/23 Potential to Achieve Goals: Good  OT Frequency: Min 1X/week     Co-evaluation              AM-PAC OT "6 Clicks" Daily Activity     Outcome Measure Help from another person eating meals?: A Little Help from another person taking care of personal grooming?: A Little Help from another person toileting, which includes using toliet, bedpan, or urinal?: A Lot Help from another person bathing (including washing, rinsing, drying)?: A Lot Help from another person to put on and taking off regular upper body clothing?: A Lot Help from another person to put on and taking off regular lower body clothing?: A Lot 6 Click Score: 14   End of Session Nurse Communication: Mobility status  Activity Tolerance: Patient limited by fatigue Patient left: in bed;with call bell/phone within reach;with bed alarm set;with family/visitor present;with nursing/sitter in room  OT Visit Diagnosis: Unsteadiness on feet (R26.81);Other abnormalities of gait and mobility (R26.89);Muscle weakness (generalized) (M62.81)                Time: 4742-5956 OT Time Calculation (min): 27 min Charges:  OT General Charges $OT Visit: 1 Visit OT Evaluation $OT Eval Moderate Complexity: 1 Mod OT Treatments $Self Care/Home Management : 8-22 mins  Carver Fila, OTD, OTR/L SecureChat Preferred Acute Rehab [phone]   Carver Fila Koonce 03/02/2023, 2:19 PM

## 2023-03-02 NOTE — Progress Notes (Signed)
Initial Nutrition Assessment  DOCUMENTATION CODES:  Underweight, Severe malnutrition in context of social or environmental circumstances  INTERVENTION:  Liberalize diet to regular in the context of underweight BMI and malnutrition Bedtime snack Set-up assistance  when family is not present as pt is visually impaired Ensure Enlive po TID, each supplement provides 350 kcal and 20 grams of protein. MVI with minerals daily  NUTRITION DIAGNOSIS:  Severe Malnutrition (in the context of social/environmental circumstances) related to  (inadequate energy intake) as evidenced by severe fat depletion, severe muscle depletion.  GOAL:  Patient will meet greater than or equal to 90% of their needs  MONITOR:  PO intake, Supplement acceptance, Labs, Weight trends  REASON FOR ASSESSMENT:  Consult Assessment of nutrition requirement/status, Poor PO  ASSESSMENT:  Pt with hx of CKD, DM, HLD, HTN, and skin cancer s/p resection and radiotherapy (most recent surgery 11/4, removed tumor from lining of his skull) presented to ED with seizure like activity.  Pt resting in bed at the time of assessment. Wife at bedside assists with hx as well. Pt reports that he ate well this AM at breakfast, but that he did not want lunch. Tray observed to be spaghetti and broccoli - pt requests finger foods be sent to him as he is blind and is unable to maneuver utensils to find his food. Will make adjustments in kitchen's dining software to facility this request and also ask nursing staff to check-in at meals to ensure food is appropriate. Encouraged wife to call down and order meals while pt is here to ensure that items he prefers are sent.   Discussed weight. Pt reports that he has been losing weight for years but that he seems to have stabilized at about 135-140 lbs. Weight taken this admission is ~133 lbs. Pt with severe loss of muscle and fat deficits on exam. Inquired about usual intake and pt is likely not eating enough  to support weight gain. BMI in undesirable range, particularly in the context of his advanced age.   Usual intake: Breakfast: cereal with milk and peaches Lunch: A sandwich (grilled cheese, ham and cheese, etc) Dinner: Peanut butter crackers (crunchy peanut butter) Snack: cookies before bed  Pt has had ensure supplements in the past and is agreeable to receiving this admission to augment intake and encourage weight gain. Will also liberalize diet and add bedtime snack.  Admit weight: 60.8kg 4% weight loss noted since 01/18/23 (~1 month) at PCP visit which is not severe but is concerning as pt is already underweight and with advanced age  Average Meal Intake: 11/5: 0% intake x 1 recorded meals   Nutritionally Relevant Medications: Scheduled Meds:  ferrous sulfate  325 mg Oral BID WC   insulin aspart  0-9 Units Subcutaneous TID WC   Continuous Infusions:  sodium chloride 40 mL/hr at 03/02/23 0600   Labs Reviewed: Creatinine 1.49 CBG ranges from 72-105 mg/dL over the last 24 hours HgbA1c 5.8% (9/24)  NUTRITION - FOCUSED PHYSICAL EXAM: Flowsheet Row Most Recent Value  Orbital Region Severe depletion  Upper Arm Region Severe depletion  Thoracic and Lumbar Region Moderate depletion  Buccal Region Severe depletion  Temple Region Severe depletion  Clavicle Bone Region Severe depletion  Clavicle and Acromion Bone Region Severe depletion  Scapular Bone Region Moderate depletion  Dorsal Hand Severe depletion  Patellar Region Severe depletion  Anterior Thigh Region Severe depletion  Posterior Calf Region Severe depletion  Edema (RD Assessment) None  Hair Reviewed  Eyes Reviewed  Mouth  Reviewed  Skin Reviewed  Nails Reviewed   Diet Order:   Diet Order             Diet regular Room service appropriate? Yes with Assist; Fluid consistency: Thin  Diet effective now                   EDUCATION NEEDS:   Education needs have been addressed  Skin:  Skin Assessment:  Reviewed RN Assessment  Last BM:  11/4  Height:  Ht Readings from Last 1 Encounters:  03/02/23 6\' 2"  (1.88 m)    Weight:  Wt Readings from Last 1 Encounters:  03/02/23 60.5 kg    Ideal Body Weight:  86.4 kg  BMI:  Body mass index is 17.12 kg/m.  Estimated Nutritional Needs:  Kcal:  1800-2000 kcal/d Protein:  85-100 g/d Fluid:  >2L/d    Douglas Melton, RD, LDN Clinical Dietitian RD pager # available in [facility]  After hours/weekend pager # available in [facility]

## 2023-03-02 NOTE — Progress Notes (Signed)
  Inpatient Rehab Admissions Coordinator :  Per therapy recommendations patient was screened for CIR candidacy by Ottie Glazier RN MSN. Patient currently observation status. Patient does not appear to demonstrate the medical neccesity for a Hospital Rehabilitation /CIR admit. I will not place a Rehab Consult. I await further medical workup to determine if hospital level rehab appropriate. Please contact me with any questions.  Ottie Glazier RN MSN Admissions Coordinator [phone]

## 2023-03-02 NOTE — Progress Notes (Signed)
Neurology progress note  S: No further events c/f seizure since hooked up to EEG.  Vitals:   03/02/23 1536 03/02/23 2036  BP: (!) 155/60 (!) 161/101  Pulse: 95 100  Resp: 18 18  Temp: 100.1 F (37.8 C) 98 F (36.7 C)  SpO2: 97% 97%    Constitutional: Appears well-developed and well-nourished.  Psych: Affect appropriate to situation.  Eyes: No scleral injection.  HENT: No OP obstruction.  Head: Normocephalic.  Cardiovascular: Normal rate and regular rhythm.  Respiratory: Effort normal, non-labored breathing.  Skin: WDI.    NEURO:  Mental Status: Alert and oriented to person place and situation, able to state correct year but cannot recall the month Speech/Language: speech is without dysarthria or aphasia.     Cranial Nerves:  II: Blind at baseline III, IV, VI: EOMI. Eyelids elevate symmetrically.  V: Sensation is intact to light touch and symmetrical to face.  VII: Smile is symmetrical.  VIII: hearing intact to voice. IX, X: Phonation is normal.  [id number] shrug 5/5. XII: tongue is midline without fasciculations. Motor: 5/5 strength to left upper extremity and bilateral lower extremities, no movement of right upper extremity however, this may be effort dependent as patient is afraid that moving his arm will cause another seizure Tone: is normal and bulk is normal Sensation- Intact to light touch bilaterally.  Gait- deferred    EEG shows intermittent generalized slowing  A/P: Patient's presentation is most c/w seizures although I cannot see how these would have been precipitated by the surgery. Of note he has a fib and is not on [facility] 2/2 patient refusal so it is possible he had a small stroke precipitating these events. Dr. Artis Flock spoke to surgeon who said that he would be okay with him undergoing MRI.  Patient is amenable to this tomorrow. Will leave EEG leads on overnight for spell capture and continue keppra 500mg  bid in the meantime  Bing Neighbors, MD [facility] [phone]  If 7pm- 7am, please page neurology on call as listed in [facility]ION.

## 2023-03-02 NOTE — Procedures (Addendum)
Patient Name: Douglas Melton  MRN: [id number]  Epilepsy Attending: Charlsie Quest  Referring Physician/Provider: Marjorie Smolder, NP  Duration: 03/01/2023 1715 to 03/02/2023 1715  Patient history: 87 y.o. male patient noticed seizure-like activity with painful twitching and shaking of his right arm which lasted about 5 or 6 minutes. Patient was unable to move his arm after this episode. He called EMS and was brought to the hospital, and episodes recurred 3 times. EEG to evaluate for seizure  Level of alertness: Awake, asleep  AEDs during EEG study: LEV  Technical aspects: This EEG study was done with scalp electrodes positioned according to the 10-20 International system of electrode placement. Electrical activity was reviewed with band pass filter of 1-70Hz , sensitivity of 7 uV/mm, display speed of 34mm/sec with a 60Hz  notched filter applied as appropriate. EEG data were recorded continuously and digitally stored.  Video monitoring was available and reviewed as appropriate.  Description: The posterior dominant rhythm consists of 7.5 Hz activity of moderate voltage (25-35 uV) seen predominantly in posterior head regions, symmetric and reactive to eye opening and eye closing. Sleep was characterized by vertex waves, sleep spindles (12 to 14 Hz), maximal frontocentral region. EEG showed intermittent generalized 3-7 Hz theta- delta slowing. Hyperventilation and photic stimulation were not performed.     ABNORMALITY -Intermittent slow, generalized  IMPRESSION: This study is suggestive of mild diffuse encephalopathy. No seizures or epileptiform discharges were seen throughout the recording.  Please note lack of epileptiform activity during interictal EEG does not exclude the diagnosis of epilepsy.  Aimar Borghi Annabelle Harman

## 2023-03-02 NOTE — Progress Notes (Signed)
Inpatient Rehabilitation Admissions Coordinator   Patient now Inpt. I will place rehab consult for full assessment of rehab venue needs.  Ottie Glazier, RN, MSN Rehab Admissions Coordinator [phone] 03/02/2023 4:19 PM

## 2023-03-02 NOTE — Evaluation (Signed)
Physical Therapy Evaluation Patient Details Name: Douglas Melton MRN: [id number] DOB: 1935/12/05 Today's Date: 03/02/2023  History of Present Illness  Pt is an 87 y/o M admitted on 03/01/23 after presenting with c/o sudden onset jerking of RUE concerning for focal seizures. PMH: CKD, DM2, HTN, HLD, hypothyroidism, tobacco abuse, blindness, SCC & fibroxanthoma of the scalp, outer table craniectomy 02/28/23, glaucoma, blind  Clinical Impression  Pt seen for PT evaluation with pt agreeable, wife present, sons present but exiting shortly after start of session. Prior to admission pt was living with wife in a multilevel home with stair lift to access main level from basement where they park their car. Pt ambulated around the home without AD, occasionally furniture walking. Pt is blind at baseline, only able to see light. On this date, pt requires up to mod assist for sit>supine, min assist for STS. Pt reports dizziness upon sitting that resolves but again up on standing; see below for BP. Pt would benefit from full orthostatic assessment. Pt only tolerates standing ~2 minutes 2/2 fatigue. Will continue to follow pt acutely to address balance, endurance, strengthening, & gait with LRAD.  BP checked in LUE:  Sitting EOB: 143/70 mmHg MAP 86, HR 67 bpm Standing at 0: 125/63 mmHg MAP 77, HR 73 bpm       If plan is discharge home, recommend the following: A lot of help with walking and/or transfers;A lot of help with bathing/dressing/bathroom;Assist for transportation;Help with stairs or ramp for entrance;Assistance with cooking/housework   Can travel by private vehicle        Equipment Recommendations None recommended by PT (defer to next venue)  Recommendations for Other Services       Functional Status Assessment Patient has had a recent decline in their functional status and demonstrates the ability to make significant improvements in function in a reasonable and predictable amount of time.      Precautions / Restrictions Precautions Precautions: Fall Precaution Comments: blind Restrictions Weight Bearing Restrictions: No      Mobility  Bed Mobility Overal bed mobility: Needs Assistance Bed Mobility: Supine to Sit, Sit to Supine     Supine to sit: Contact guard, HOB elevated, Used rails (extra time to upright self to sitting EOB) Sit to supine: Mod assist, HOB elevated, Used rails (assistance to straighten in bed)        Transfers Overall transfer level: Needs assistance Equipment used: None Transfers: Sit to/from Stand Sit to Stand: Min assist           General transfer comment: STS x 2 from EOB, extra time to power up to standing    Ambulation/Gait                  Stairs            Wheelchair Mobility     Tilt Bed    Modified Rankin (Stroke Patients Only)       Balance Overall balance assessment: Needs assistance Sitting-balance support: Feet supported Sitting balance-Leahy Scale: Fair Sitting balance - Comments: supervision static sitting EOB   Standing balance support: Bilateral upper extremity supported Standing balance-Leahy Scale: Poor                               Pertinent Vitals/Pain Pain Assessment Pain Assessment: No/denies pain    Home Living Family/patient expects to be discharged to:: Private residence Living Arrangements: Spouse/significant other Available Help at Discharge: Family Type of  Home: House Home Access: Other (comment)     Alternate Level Stairs-Number of Steps: stair lift from basement to main level Home Layout: Multi-level        Prior Function Prior Level of Function : Independent/Modified Independent             Mobility Comments: Ambulatory in home without AD, furniture walks PRN, uses stair lift to access main level from basement.       Extremity/Trunk Assessment   Upper Extremity Assessment Upper Extremity Assessment: RUE deficits/detail;Defer to OT  evaluation RUE Deficits / Details: absent grip strength, minimal to no active movement noted in RUE in functional context    Lower Extremity Assessment Lower Extremity Assessment: Generalized weakness    Cervical / Trunk Assessment Cervical / Trunk Assessment:  (recent scalp sx (02/28/23))  Communication   Communication Communication: No apparent difficulties  Cognition Arousal: Alert Behavior During Therapy: [facility] for tasks assessed/performed Overall Cognitive Status: Within Functional Limits for tasks assessed                                          General Comments      Exercises     Assessment/Plan    PT Assessment Patient needs continued PT services  PT Problem List Decreased strength;Decreased activity tolerance;Decreased balance;Decreased mobility;Decreased knowledge of use of DME       PT Treatment Interventions DME instruction;Balance training;Gait training;Neuromuscular re-education;Stair training;Functional mobility training;Therapeutic activities;Therapeutic exercise;Manual techniques;Patient/family education    PT Goals (Current goals can be found in the Care Plan section)  Acute Rehab PT Goals Patient Stated Goal: get better PT Goal Formulation: With patient Time For Goal Achievement: 03/16/23 Potential to Achieve Goals: Good    Frequency Min 1X/week     Co-evaluation               AM-PAC PT "6 Clicks" Mobility  Outcome Measure Help needed turning from your back to your side while in a flat bed without using bedrails?: A Little Help needed moving from lying on your back to sitting on the side of a flat bed without using bedrails?: A Little Help needed moving to and from a bed to a chair (including a wheelchair)?: A Little Help needed standing up from a chair using your arms (e.g., wheelchair or bedside chair)?: A Little Help needed to walk in hospital room?: A Lot Help needed climbing 3-5 steps with a railing? : Total 6 Click  Score: 15    End of Session   Activity Tolerance: Patient limited by fatigue Patient left: in bed;with call bell/phone within reach;with bed alarm set;with nursing/sitter in room;with family/visitor present Nurse Communication: Mobility status PT Visit Diagnosis: Muscle weakness (generalized) (M62.81);Difficulty in walking, not elsewhere classified (R26.2);Other abnormalities of gait and mobility (R26.89);Unsteadiness on feet (R26.81)    Time: 4098-1191 PT Time Calculation (min) (ACUTE ONLY): 17 min   Charges:   PT Evaluation $PT Eval Moderate Complexity: 1 Mod   PT General Charges $$ ACUTE PT VISIT: 1 Visit         Aleda Grana, PT, DPT 03/02/23, 11:10 AM   Sandi Mariscal 03/02/2023, 11:08 AM

## 2023-03-02 NOTE — TOC Initial Note (Signed)
Transition of Care [facility]) - Initial/Assessment Note    Patient Details  Name: Douglas Melton MRN: [id number] Date of Birth: 1935-06-29  Transition of Care [facility]) CM/SW Contact:    Kermit Balo, RN Phone Number: 03/02/2023, 11:38 AM  Clinical Narrative:                  Pt is from home with his spouse. They are together most of the time.  He doesn't use any DME at home.  Wife manages his medications and provides needed transportation. Current recommendation is for CIR. Awaiting work up. TOC following.  Expected Discharge Plan: IP Rehab Facility Barriers to Discharge: Continued Medical Work up   Patient Goals and CMS Choice   CMS Medicare.gov Compare Post Acute Care list provided to:: Patient Choice offered to / list presented to : Patient, Spouse      Expected Discharge Plan and Services     Post Acute Care Choice: IP Rehab Living arrangements for the past 2 months: Single Family Home                                      Prior Living Arrangements/Services Living arrangements for the past 2 months: Single Family Home Lives with:: Spouse Patient language and need for interpreter reviewed:: Yes Do you feel safe going back to the place where you live?: Yes        Care giver support system in place?: Yes (comment) Current home services: DME (none) Criminal Activity/Legal Involvement Pertinent to Current Situation/Hospitalization: No - Comment as needed  Activities of Daily Living      Permission Sought/Granted                  Emotional Assessment Appearance:: Appears stated age Attitude/Demeanor/Rapport: Engaged Affect (typically observed): Accepting Orientation: : Oriented to Self, Oriented to Place, Oriented to  Time, Oriented to Situation   Psych Involvement: No (comment)  Admission diagnosis:  Seizure (HCC) [R56.9] Elevated blood pressure reading [R03.0] Squamous cell carcinoma, scalp/neck [C44.42] Seizure-like activity (HCC)  [R56.9] Patient Active Problem List   Diagnosis Date Noted   Seizure-like activity (HCC) 03/01/2023   Blindness 03/01/2023   New onset a-fib (HCC) 03/01/2023   FTT (failure to thrive) in adult 03/01/2023   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 [place] [address] Phone: [phone] Fax: [phone]     Social Determinants of Health (SDOH) Social History: SDOH Screenings   Food Insecurity: Low Risk  (01/18/2023)   Received from [facility]  Transportation Needs: No Transportation Needs (01/18/2023)   Received from [facility]  Utilities: Low Risk  (01/18/2023)   Received from [facility]  Tobacco Use: Medium Risk (03/01/2023)   SDOH Interventions:     Readmission Risk Interventions     No data to display

## 2023-03-02 NOTE — Progress Notes (Signed)
Echocardiogram 2D Echocardiogram has been performed.  Casimiro Needle P Cherrelle Plante 03/02/2023, 10:03 AM

## 2023-03-02 NOTE — Progress Notes (Signed)
PROGRESS NOTE    Douglas Melton  TGG:[id number] DOB: 04-23-1936 DOA: 03/01/2023 PCP: Barbie Banner, MD   Brief Narrative: Douglas Melton is a 87 y.o. 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  Patient presented secondary to seizure-like activity. Neurology consulted. Keppra started and EEG ordered.   Assessment and Plan:  Seizure-like activity Patient with right arm movements concerning for possible focal seizures. Neurology consulted and are not convinced this is related to recent craniectomy. Long-term EEG ordered and patient loaded on Keppra. MRI declined initially by patient. -Ongoing neurology recommendations  Atrial fibrillation New-onset.   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  CKD stage IIIa Stable.  Diabetes mellitus type 2 Controlled with hemoglobin A1C of 5.8%. Glipizide held. -Continue SSI  Hypothyroidism -Continue Synthroid  Blindness Noted.  Failure to thrive Noted. Dietitian consulted.   DVT prophylaxis: SCDs Code Status:   Code Status: Full Code Family Communication: Wife and two sons at bedside Disposition Plan: Discharge likely to rehabilitation pending ongoing neurology recommendations   Consultants:  Neurology  Procedures:  EEG  Antimicrobials: None    Subjective: Patient reports no specific concerns this morning.  Objective: BP (!) 155/60 (BP Location: Left Arm)   Pulse 95   Temp 100.1 F (37.8 C) (Oral)   Resp 18   Ht 6\' 2"  (1.88 m)   Wt 60.5 kg   SpO2 97%   BMI 17.12 kg/m   Examination:  General exam: Appears calm and comfortable Respiratory system: Clear to auscultation. Respiratory effort normal. Cardiovascular system: S1 &  S2 heard. Gastrointestinal system: Abdomen is nondistended, soft and nontender. Normal bowel sounds heard. Central nervous system: Alert and oriented. Musculoskeletal: No edema. No calf tenderness Psychiatry: Judgement and insight appear normal. Mood & affect appropriate.    Data Reviewed: I have personally reviewed following labs and imaging studies  CBC Lab Results  Component Value Date   WBC 10.7 (H) 03/02/2023   RBC 3.79 (L) 03/02/2023   HGB 12.0 (L) 03/02/2023   HCT 37.6 (L) 03/02/2023   MCV 99.2 03/02/2023   MCH 31.7 03/02/2023   PLT 309 03/02/2023   MCHC 31.9 03/02/2023   RDW 14.0 03/02/2023   LYMPHSABS 2.0 02/14/2020   MONOABS 0.6 02/14/2020   EOSABS 0.2 02/14/2020   BASOSABS 0.1 02/14/2020     Last metabolic panel Lab Results  Component Value Date   NA 139 03/02/2023   K 3.7 03/02/2023   CL 105 03/02/2023   CO2 24 03/02/2023   BUN 16 03/02/2023   CREATININE 1.49 (H) 03/02/2023   GLUCOSE 82 03/02/2023   GFRNONAA 46 (L) 03/02/2023   GFRAA 57 (L) 11/03/2018   CALCIUM 8.6 (L) 03/02/2023   PHOS 2.9 02/20/2020   PROT 6.2 (L) 03/01/2023   ALBUMIN 3.1 (L) 03/01/2023   BILITOT 0.9 03/01/2023   ALKPHOS 83 03/01/2023   AST 17 03/01/2023   ALT 9 03/01/2023   ANIONGAP 10 03/02/2023    GFR: Estimated Creatinine Clearance: 31 mL/min (A) (by C-G formula based on SCr of 1.49 mg/dL (H)).  No results found for this or any previous visit (from the past 240 hour(s)).    Radiology Studies: ECHOCARDIOGRAM COMPLETE  Result Date: 03/02/2023    ECHOCARDIOGRAM REPORT   Patient Name:   Douglas Melton  Date of Exam: 03/02/2023 Medical Rec #:  [id number]       Height:       74.0 in Accession #:    [id number]      Weight:       150.0 lb Date of Birth:  08/14/1935       BSA:          1.923 m Patient Age:    87 years        BP:           145/65 mmHg Patient Gender: M               HR:           85 bpm. Exam Location:  Inpatient Procedure: 2D Echo, Cardiac Doppler and Color Doppler  Indications:    Atrial Fibrillation  History:        Patient has no prior history of Echocardiogram examinations.                 Risk Factors:Diabetes and Former Smoker.  Sonographer:    Karma Ganja Referring Phys: [id number] Orland Mustard  Sonographer Comments: Technically challenging study due to limited acoustic windows and suboptimal subcostal window. Image acquisition challenging due to patient body habitus. IMPRESSIONS  1. Left ventricular ejection fraction, by estimation, is 50 to 55%. The left ventricle has low normal function. Left ventricular endocardial border not optimally defined to evaluate regional wall motion. There is mild left ventricular hypertrophy. Left ventricular diastolic parameters are consistent with Grade I diastolic dysfunction (impaired relaxation).  2. Right ventricular systolic function is normal. The right ventricular size is normal. Tricuspid regurgitation signal is inadequate for assessing PA pressure.  3. The mitral valve is degenerative. No evidence of mitral valve regurgitation. No evidence of mitral stenosis. Severe mitral annular calcification.  4. The aortic valve was not well visualized. Aortic valve regurgitation is not visualized. No aortic stenosis is present. FINDINGS  Left Ventricle: Left ventricular ejection fraction, by estimation, is 50 to 55%. The left ventricle has low normal function. Left ventricular endocardial border not optimally defined to evaluate regional wall motion. The left ventricular internal cavity  size was normal in size. There is mild left ventricular hypertrophy. Left ventricular diastolic parameters are consistent with Grade I diastolic dysfunction (impaired relaxation). Right Ventricle: The right ventricular size is normal. Right vetricular wall thickness was not well visualized. Right ventricular systolic function is normal. Tricuspid regurgitation signal is inadequate for assessing PA pressure. Left Atrium: Left atrial size was normal in size.  Right Atrium: Right atrial size was normal in size. Pericardium: There is no evidence of pericardial effusion. Mitral Valve: The mitral valve is degenerative in appearance. Severe mitral annular calcification. No evidence of mitral valve regurgitation. No evidence of mitral valve stenosis. Tricuspid Valve: The tricuspid valve is normal in structure. Tricuspid valve regurgitation is trivial. Aortic Valve: The aortic valve was not well visualized. Aortic valve regurgitation is not visualized. No aortic stenosis is present. Aortic valve mean gradient measures 1.7 mmHg. Aortic valve peak gradient measures 3.0 mmHg. Aortic valve area, by VTI measures 3.03 cm. Pulmonic Valve: The pulmonic valve was not well visualized. Pulmonic valve regurgitation is not visualized. Aorta: The aortic root is normal in size and structure. IAS/Shunts: The interatrial septum was not well visualized.  LEFT VENTRICLE PLAX 2D LVIDd:         4.10 cm   Diastology LVIDs:         3.00 cm   LV  e' medial:    4.57 cm/s LV PW:         1.10 cm   LV E/e' medial:  14.1 LV IVS:        1.10 cm   LV e' lateral:   6.09 cm/s LVOT diam:     2.00 cm   LV E/e' lateral: 10.5 LV SV:         45 LV SV Index:   23 LVOT Area:     3.14 cm  RIGHT VENTRICLE             IVC RV Basal diam:  2.90 cm     IVC diam: 0.90 cm RV S prime:     12.97 cm/s TAPSE (M-mode): 2.0 cm LEFT ATRIUM             Index        RIGHT ATRIUM           Index LA diam:        4.00 cm 2.08 cm/m   RA Area:     12.20 cm LA Vol (A2C):   44.1 ml 22.93 ml/m  RA Volume:   28.30 ml  14.71 ml/m LA Vol (A4C):   29.1 ml 15.13 ml/m LA Biplane Vol: 37.9 ml 19.71 ml/m  AORTIC VALVE AV Area (Vmax):    2.62 cm AV Area (Vmean):   2.62 cm AV Area (VTI):     3.03 cm AV Vmax:           86.57 cm/s AV Vmean:          60.433 cm/s AV VTI:            0.148 m AV Peak Grad:      3.0 mmHg AV Mean Grad:      1.7 mmHg LVOT Vmax:         72.23 cm/s LVOT Vmean:        50.367 cm/s LVOT VTI:          0.143 m LVOT/AV VTI  ratio: 0.96  AORTA Ao Root diam: 2.55 cm MITRAL VALVE MV Area (PHT): 4.64 cm    SHUNTS MV Decel Time: 164 msec    Systemic VTI:  0.14 m MV E velocity: 64.26 cm/s  Systemic Diam: 2.00 cm MV A velocity: 95.70 cm/s MV E/A ratio:  0.67 Epifanio Lesches MD Electronically signed by Epifanio Lesches MD Signature Date/Time: 03/02/2023/12:17:31 PM    Final    Overnight EEG with video  Result Date: 03/02/2023 Charlsie Quest, MD     03/02/2023  8:58 AM Patient Name: Douglas Melton MRN: [id number] Epilepsy Attending: Charlsie Quest Referring Physician/Provider: Marjorie Smolder, NP Duration: 03/01/2023 03/02/2023 [id number] Patient history: 87 y.o. male patient noticed seizure-like activity with painful twitching and shaking of his right arm which lasted about 5 or 6 minutes. Patient was unable to move his arm after this episode. He called EMS and was brought to the hospital, and episodes recurred 3 times. EEG to evaluate for seizure Level of alertness: Awake, asleep AEDs during EEG study: LEV Technical aspects: This EEG study was done with scalp electrodes positioned according to the 10-20 International system of electrode placement. Electrical activity was reviewed with band pass filter of 1-70Hz , sensitivity of 7 uV/mm, display speed of 60mm/sec with a 60Hz  notched filter applied as appropriate. EEG data were recorded continuously and digitally stored.  Video monitoring was available and reviewed as appropriate. Description: The posterior dominant rhythm consists of 7.5  Hz activity of moderate voltage (25-35 uV) seen predominantly in posterior head regions, symmetric and reactive to eye opening and eye closing. Sleep was characterized by vertex waves, sleep spindles (12 to 14 Hz), maximal frontocentral region. EEG showed intermittent generalized 3-7 Hz theta- delta slowing. Hyperventilation and photic stimulation were not performed.   ABNORMALITY -Intermittent slow, generalized IMPRESSION: This study is  suggestive of mild diffuse encephalopathy. No seizures or epileptiform discharges were seen throughout the recording. Please note lack of epileptiform activity during interictal EEG does not exclude the diagnosis of epilepsy. Charlsie Quest   CT Cervical Spine Wo Contrast  Result Date: 03/01/2023 CLINICAL DATA:  87 year old male with seizure. "Yesterday removed tumor from lining of skull". EXAM: CT CERVICAL SPINE WITHOUT CONTRAST TECHNIQUE: Multidetector CT imaging of the cervical spine was performed without intravenous contrast. Multiplanar CT image reconstructions were also generated. RADIATION DOSE REDUCTION: This exam was performed according to the departmental dose-optimization program which includes automated exposure control, adjustment of the mA and/or kV according to patient size and/or use of iterative reconstruction technique. COMPARISON:  Head CT today. FINDINGS: Alignment: Normal cervical lordosis. Cervicothoracic junction alignment is within normal limits. Bilateral posterior element alignment is within normal limits. Skull base and vertebrae: Visualized skull base is intact. No atlanto-occipital dissociation. C1 and C2 appear intact and aligned. No acute or suspicious osseous lesion identified in the cervical spine. Soft tissues and spinal canal: No prevertebral fluid or swelling. No visible canal hematoma. Calcified cervical carotid atherosclerosis. Otherwise negative visible noncontrast neck soft tissues. Disc levels: Mild for age cervical spine degeneration. No CT evidence of spinal stenosis. Upper chest: Visible upper thoracic levels appear grossly intact. Mild apical lung scarring. IMPRESSION: 1. No acute or suspicious osseous abnormality identified in the cervical spine. 2. Mild for age cervical spine degeneration. 3. Calcified cervical carotid atherosclerosis. Electronically Signed   By: [name] M.D.   On: 03/01/2023 10:45   CT Head Wo Contrast  Result Date: 03/01/2023 CLINICAL DATA:   87 year old male with seizure. "Yesterday removed tumor from lining of skull". EXAM: CT HEAD WITHOUT CONTRAST TECHNIQUE: Contiguous axial images were obtained from the base of the skull through the vertex without intravenous contrast. RADIATION DOSE REDUCTION: This exam was performed according to the departmental dose-optimization program which includes automated exposure control, adjustment of the mA and/or kV according to patient size and/or use of iterative reconstruction technique. COMPARISON:  None Available. FINDINGS: Brain: Cerebral volume loss appears to be generalized. Dural calcifications, including symmetric tentorium involvement. No midline shift, mass effect, or evidence of intracranial mass lesion. No ventriculomegaly. No acute intracranial hemorrhage identified. Patchy and scattered bilateral cerebral white matter hypodensity with some deep white matter capsule involvement. Deep gray nuclei and posterior fossa relatively spared. No vasogenic edema identified. No acute or chronic cortically based infarct identified. Vascular: No suspicious intracranial vascular hyperdensity. Calcified atherosclerosis at the skull base. Skull: Abnormally eroded anterior vertex, left greater than right superior frontal bones further detailed below. No craniotomy. Background bone mineralization is within normal limits, or mild skull base osteopenia. No other No acute osseous abnormality identified. Sinuses/Orbits: Visualized paranasal sinuses and mastoids are clear. Other: There is an anterior vertex exophytic, superficial mass which is inseparable from the scalp, demonstrates abnormal mixed density, and seems to be associated with erosion of the skull outer table on series 4, image 65. This bone erosion is more pronounced to the left of midline. Negative visible orbits soft tissues aside from postoperative changes. Calcified scalp vessel atherosclerosis. IMPRESSION:  1. Partially eroded outer table of the skull at the  anterior vertex with overlying exophytic mixed density mass which might be postoperative dressing material (uncertain). No craniotomy. No skull fracture or full-thickness calvarium erosion. 2. No acute intracranial abnormality. Generalized cerebral volume loss and moderate for age white matter disease. Electronically Signed   By: [name] M.D.   On: 03/01/2023 10:43      LOS: 0 days    Jacquelin Hawking, MD [facility] 03/02/2023, 4:01 PM   If 7PM-7AM, please contact night-coverage [email or web address]

## 2023-03-02 NOTE — Plan of Care (Signed)
  Problem: Education: Goal: Ability to describe self-care measures that may prevent or decrease complications (Diabetes Survival Skills Education) will improve Outcome: Progressing Goal: Individualized Educational Video(s) Outcome: Progressing   

## 2023-03-02 NOTE — Hospital Course (Addendum)
Douglas Melton is a 87 y.o. 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  Patient presented secondary to seizure-like activity. Neurology consulted. Keppra started and EEG ordered, which did not capture evidence of seizure activity. MRI brain obtained showing multiple acute strokes, concerning for embolic etiology.

## 2023-03-03 ENCOUNTER — Inpatient Hospital Stay (HOSPITAL_COMMUNITY): Payer: Medicare Other

## 2023-03-03 DIAGNOSIS — R569 Unspecified convulsions: Secondary | ICD-10-CM | POA: Diagnosis not present

## 2023-03-03 LAB — GLUCOSE, CAPILLARY
Glucose-Capillary: 132 mg/dL — ABNORMAL HIGH (ref 70–99)
Glucose-Capillary: 185 mg/dL — ABNORMAL HIGH (ref 70–99)
Glucose-Capillary: 152 mg/dL — ABNORMAL HIGH (ref 70–99)

## 2023-03-03 MED ORDER — GADOBUTROL 1 MMOL/ML IV SOLN
6.0000 mL | Freq: Once | INTRAVENOUS | Status: AC | PRN
  Administered 2023-03-03: 6 mL via INTRAVENOUS

## 2023-03-03 MED ORDER — LEVETIRACETAM 500 MG PO TABS
500.0000 mg | ORAL_TABLET | Freq: Two times a day (BID) | ORAL | Status: DC
  Administered 2023-03-03 – 2023-03-07 (×8): 500 mg via ORAL
  Filled 2023-03-03 (×8): qty 1

## 2023-03-03 NOTE — Progress Notes (Addendum)
PROGRESS NOTE    Douglas Melton  QQV:[id number] DOB: 07-Feb-1936 DOA: 03/01/2023 PCP: Barbie Banner, MD   Brief Narrative: Douglas Melton is a 87 y.o. 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  Patient presented secondary to seizure-like activity. Neurology consulted. Keppra started and EEG ordered.   Assessment and Plan:  Seizure-like activity Patient with right arm movements concerning for possible focal seizures. Neurology consulted and are not convinced this is related to recent craniectomy. Long-term EEG ordered and patient loaded on Keppra. EEG without evidence of seizure or epileptiform discharges. -Ongoing neurology recommendations: plan for MRI today  Atrial fibrillation New-onset. Patient is declining anticoagulation. 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  CKD stage IIIa Stable.  Diabetes mellitus type 2 Controlled with hemoglobin A1C of 5.8%. Glipizide held. -Continue SSI  Hypothyroidism -Continue Synthroid  Blindness Noted.  Failure to thrive Severe malnutrition Underweight Noted. Dietitian consulted. Estimated body mass index is 17.12 kg/m as calculated from the following:   Height as of this encounter: 6\' 2"  (1.88 m).   Weight as of this encounter: 60.5 kg.   DVT prophylaxis: SCDs Code Status:   Code Status: Full Code Family Communication: Wife at bedside Disposition Plan: Discharge likely to rehabilitation pending ongoing neurology recommendations   Consultants:  Neurology  Procedures:  EEG  Antimicrobials: None    Subjective: Concerned about his right arm weakness. No other concerns.  Objective: BP (!) 148/87    Pulse 78   Temp 98.6 F (37 C) (Oral)   Resp 19   Ht 6\' 2"  (1.88 m)   Wt 60.5 kg   SpO2 97%   BMI 17.12 kg/m   Examination:  General exam: Appears calm and comfortable Respiratory system: Clear to auscultation. Respiratory effort normal. Cardiovascular system: S1 & S2 heard, RRR. Gastrointestinal system: Abdomen is nondistended, soft and nontender. Normal bowel sounds heard. Central nervous system: Alert and oriented. 4/5 RUE strength compared to 5/5 LUE strength. Psychiatry: Judgement and insight appear normal. Mood & affect appropriate.    Data Reviewed: I have personally reviewed following labs and imaging studies  CBC Lab Results  Component Value Date   WBC 10.7 (H) 03/02/2023   RBC 3.79 (L) 03/02/2023   HGB 12.0 (L) 03/02/2023   HCT 37.6 (L) 03/02/2023   MCV 99.2 03/02/2023   MCH 31.7 03/02/2023   PLT 309 03/02/2023   MCHC 31.9 03/02/2023   RDW 14.0 03/02/2023   LYMPHSABS 2.0 02/14/2020   MONOABS 0.6 02/14/2020   EOSABS 0.2 02/14/2020   BASOSABS 0.1 02/14/2020     Last metabolic panel Lab Results  Component Value Date   NA 139 03/02/2023   K 3.7 03/02/2023   CL 105 03/02/2023   CO2 24 03/02/2023   BUN 16 03/02/2023   CREATININE 1.49 (H) 03/02/2023   GLUCOSE 82 03/02/2023   GFRNONAA 46 (L) 03/02/2023   GFRAA 57 (L) 11/03/2018   CALCIUM 8.6 (L) 03/02/2023   PHOS 2.9 02/20/2020   PROT 6.2 (L) 03/01/2023   ALBUMIN 3.1 (L) 03/01/2023   BILITOT 0.9 03/01/2023   ALKPHOS 83 03/01/2023   AST 17 03/01/2023   ALT 9 03/01/2023   ANIONGAP 10 03/02/2023    GFR: Estimated Creatinine Clearance: 31 mL/min (A) (by C-G formula  based on SCr of 1.49 mg/dL (H)).  No results found for this or any previous visit (from the past 240 hour(s)).    Radiology Studies: ECHOCARDIOGRAM COMPLETE  Result Date: 03/02/2023    ECHOCARDIOGRAM REPORT   Patient Name:   Douglas Melton Date of Exam: 03/02/2023 Medical Rec #:  [id number]       Height:       74.0 in Accession #:     [id number]      Weight:       150.0 lb Date of Birth:  [birth year]       BSA:          1.923 m Patient Age:    87 years        BP:           145/65 mmHg Patient Gender: M               HR:           85 bpm. Exam Location:  Inpatient Procedure: 2D Echo, Cardiac Doppler and Color Doppler Indications:    Atrial Fibrillation  History:        Patient has no prior history of Echocardiogram examinations.                 Risk Factors:Diabetes and Former Smoker.  Sonographer:    Karma Ganja Referring Phys: [id number] Orland Mustard  Sonographer Comments: Technically challenging study due to limited acoustic windows and suboptimal subcostal window. Image acquisition challenging due to patient body habitus. IMPRESSIONS  1. Left ventricular ejection fraction, by estimation, is 50 to 55%. The left ventricle has low normal function. Left ventricular endocardial border not optimally defined to evaluate regional wall motion. There is mild left ventricular hypertrophy. Left ventricular diastolic parameters are consistent with Grade I diastolic dysfunction (impaired relaxation).  2. Right ventricular systolic function is normal. The right ventricular size is normal. Tricuspid regurgitation signal is inadequate for assessing PA pressure.  3. The mitral valve is degenerative. No evidence of mitral valve regurgitation. No evidence of mitral stenosis. Severe mitral annular calcification.  4. The aortic valve was not well visualized. Aortic valve regurgitation is not visualized. No aortic stenosis is present. FINDINGS  Left Ventricle: Left ventricular ejection fraction, by estimation, is 50 to 55%. The left ventricle has low normal function. Left ventricular endocardial border not optimally defined to evaluate regional wall motion. The left ventricular internal cavity  size was normal in size. There is mild left ventricular hypertrophy. Left ventricular diastolic parameters are consistent with Grade I diastolic dysfunction (impaired  relaxation). Right Ventricle: The right ventricular size is normal. Right vetricular wall thickness was not well visualized. Right ventricular systolic function is normal. Tricuspid regurgitation signal is inadequate for assessing PA pressure. Left Atrium: Left atrial size was normal in size. Right Atrium: Right atrial size was normal in size. Pericardium: There is no evidence of pericardial effusion. Mitral Valve: The mitral valve is degenerative in appearance. Severe mitral annular calcification. No evidence of mitral valve regurgitation. No evidence of mitral valve stenosis. Tricuspid Valve: The tricuspid valve is normal in structure. Tricuspid valve regurgitation is trivial. Aortic Valve: The aortic valve was not well visualized. Aortic valve regurgitation is not visualized. No aortic stenosis is present. Aortic valve mean gradient measures 1.7 mmHg. Aortic valve peak gradient measures 3.0 mmHg. Aortic valve area, by VTI measures 3.03 cm. Pulmonic Valve: The pulmonic valve was not well visualized. Pulmonic valve regurgitation is not visualized. Aorta: The aortic root is  normal in size and structure. IAS/Shunts: The interatrial septum was not well visualized.  LEFT VENTRICLE PLAX 2D LVIDd:         4.10 cm   Diastology LVIDs:         3.00 cm   LV e' medial:    4.57 cm/s LV PW:         1.10 cm   LV E/e' medial:  14.1 LV IVS:        1.10 cm   LV e' lateral:   6.09 cm/s LVOT diam:     2.00 cm   LV E/e' lateral: 10.5 LV SV:         45 LV SV Index:   23 LVOT Area:     3.14 cm  RIGHT VENTRICLE             IVC RV Basal diam:  2.90 cm     IVC diam: 0.90 cm RV S prime:     12.97 cm/s TAPSE (M-mode): 2.0 cm LEFT ATRIUM             Index        RIGHT ATRIUM           Index LA diam:        4.00 cm 2.08 cm/m   RA Area:     12.20 cm LA Vol (A2C):   44.1 ml 22.93 ml/m  RA Volume:   28.30 ml  14.71 ml/m LA Vol (A4C):   29.1 ml 15.13 ml/m LA Biplane Vol: 37.9 ml 19.71 ml/m  AORTIC VALVE AV Area (Vmax):    2.62 cm AV Area  (Vmean):   2.62 cm AV Area (VTI):     3.03 cm AV Vmax:           86.57 cm/s AV Vmean:          60.433 cm/s AV VTI:            0.148 m AV Peak Grad:      3.0 mmHg AV Mean Grad:      1.7 mmHg LVOT Vmax:         72.23 cm/s LVOT Vmean:        50.367 cm/s LVOT VTI:          0.143 m LVOT/AV VTI ratio: 0.96  AORTA Ao Root diam: 2.55 cm MITRAL VALVE MV Area (PHT): 4.64 cm    SHUNTS MV Decel Time: 164 msec    Systemic VTI:  0.14 m MV E velocity: 64.26 cm/s  Systemic Diam: 2.00 cm MV A velocity: 95.70 cm/s MV E/A ratio:  0.67 Epifanio Lesches MD Electronically signed by Epifanio Lesches MD Signature Date/Time: 03/02/2023/12:17:31 PM    Final    Overnight EEG with video  Result Date: 03/02/2023 Charlsie Quest, MD     03/03/2023 10:15 AM Patient Name: Douglas Melton MRN: [id number] Epilepsy Attending: Charlsie Quest Referring Physician/Provider: Marjorie Smolder, NP Duration: 03/01/2023 1715 to 03/02/2023 1715 Patient history: 87 y.o. male patient noticed seizure-like activity with painful twitching and shaking of his right arm which lasted about 5 or 6 minutes. Patient was unable to move his arm after this episode. He called EMS and was brought to the hospital, and episodes recurred 3 times. EEG to evaluate for seizure Level of alertness: Awake, asleep AEDs during EEG study: LEV Technical aspects: This EEG study was done with scalp electrodes positioned according to the 10-20 International system of electrode placement. Electrical activity was reviewed  with band pass filter of 1-70Hz , sensitivity of 7 uV/mm, display speed of 33mm/sec with a 60Hz  notched filter applied as appropriate. EEG data were recorded continuously and digitally stored.  Video monitoring was available and reviewed as appropriate. Description: The posterior dominant rhythm consists of 7.5 Hz activity of moderate voltage (25-35 uV) seen predominantly in posterior head regions, symmetric and reactive to eye opening and eye closing. Sleep  was characterized by vertex waves, sleep spindles (12 to 14 Hz), maximal frontocentral region. EEG showed intermittent generalized 3-7 Hz theta- delta slowing. Hyperventilation and photic stimulation were not performed.   ABNORMALITY -Intermittent slow, generalized IMPRESSION: This study is suggestive of mild diffuse encephalopathy. No seizures or epileptiform discharges were seen throughout the recording. Please note lack of epileptiform activity during interictal EEG does not exclude the diagnosis of epilepsy. Charlsie Quest      LOS: 1 day    Jacquelin Hawking, MD [facility] 03/03/2023, 11:30 AM   If 7PM-7AM, please contact night-coverage [email or web address]

## 2023-03-03 NOTE — Progress Notes (Signed)
IP rehab admissions - I met with patient and his wife.  They are interested in CIR.  I gave them booklets and explained CIR program.  I will open the case with insurance carrier today.  Call for questions.  [phone]

## 2023-03-03 NOTE — Progress Notes (Signed)
PHARMACIST - PHYSICIAN COMMUNICATION  DR:   Caleb Popp  CONCERNING: IV to Oral Route Change Policy  RECOMMENDATION: This patient is receiving levetiracetam by the intravenous route.  Based on criteria approved by the Pharmacy and Therapeutics Committee, the intravenous medication(s) is/are being converted to the equivalent oral dose form(s).   DESCRIPTION: These criteria include: The patient is eating (either orally or via tube) and/or has been taking other orally administered medications for a least 24 hours The patient has no evidence of active gastrointestinal bleeding or impaired GI absorption (gastrectomy, short bowel, patient on TNA or NPO).  If you have questions about this conversion, please contact the Pharmacy Department  []   [phone] )  [facility] []   [phone] )  [facility] [x]   [phone] )  [facility] []   [phone] )  [facility] []   [phone] )  [facility]   Rexford Maus, PharmD, BCPS 03/03/2023 10:15 AM

## 2023-03-03 NOTE — PMR Pre-admission (Signed)
PMR Admission Coordinator Pre-Admission Assessment  Patient: Douglas Melton is an 87 y.o., male MRN: [id number] DOB: [birth year] Height: 6\' 2"  (188 cm) Weight: 60.5 kg  Insurance Information HMO: Yes    PPO:       PCP:       IPA:       80/20:       OTHER: Group [id number] PRIMARY: [employer] medicare      Policy#: [id number]      Subscriber: patient CM Name:       Phone#:      Fax#: [phone] Received call from [facility] at [facility] and they gave approval for admit on 11/10 for 03/06/23-03/11/23. Pre-Cert#: [id number]      Employer: Retired Benefits:  Phone #: [phone]     Name: [facility] provider portal on line Eff. Date: 04/26/22     Deduct: $0      Out of Pocket Max: $3600 (met $82.23)      Life Max: N/A CIR: $295 for days 1-5      SNF: $0 for days 1-20; $203 for days 21-100 Outpatient: med nec     Co-Pay: $20/visit Home Health: 100%      Co-Pay: none DME: 80%     Co-Pay: 20% Providers: in network  SECONDARY:       Policy#:      Phone#:   Artist:       Phone#:   The Data processing manager" for patients in Inpatient Rehabilitation Facilities with attached "Privacy Act Statement-Health Care Records" was provided and verbally reviewed with: Patient and Family  Emergency Contact Information Contact Information     Name Relation Home Work Mobile   Hoque,[name] Spouse   [phone]   Joel, Debusk   [phone]      Other Contacts   None on File     Current Medical History  Patient Admitting Diagnosis: Seizure  History of Present Illness:  A 87 y.o. male  has a past medical history of Chronic kidney disease, Diabetes mellitus without complication (HCC), Glaucoma, Hyperlipidemia, and Hypertension.  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  Most recent surgery was yesterday, when scalp mass was excised, bone scrapings of skull were taken for biopsy and some sort of dermal  matrix was placed.  This morning, patient noticed painful shaking of his right arm which was uncontrollable and lasted about 5 or 6 minutes.  Afterwards, he was unable to move his right arm.  He had 3 more of these episodes while in the ambulance and in the emergency department.  He states he has never had a serious head injury, has no family members with seizures and has never had meningitis or encephalitis, never had febrile seizures as a child and did not have any problems with his birth or development.  He has never had seizure before today.  MRI brain with and without contrast on 10/8 demonstrated no intracranial mass lesions to explain seizure. PT/OT evaluations completed with recommendations for acute inpatient rehab admission.    Patient's medical record from [facility] has been reviewed by the rehabilitation admission coordinator and physician.  Past Medical History  Past Medical History:  Diagnosis Date   Chronic kidney disease    Diabetes mellitus without complication (HCC)    Glaucoma    Hyperlipidemia    Hypertension     Has the patient had major surgery during 100 days prior to admission?  Yes  Family History   family history includes Heart disease in his father; Hypertension in his father; Other in his mother.  Current Medications  Current Facility-Administered Medications:    acetaminophen (TYLENOL) tablet 650 mg, 650 mg, Oral, Q6H PRN **OR** acetaminophen (TYLENOL) suppository 650 mg, 650 mg, Rectal, Q6H PRN, Orland Mustard, MD   feeding supplement (ENSURE ENLIVE / ENSURE PLUS) liquid 237 mL, 237 mL, Oral, TID BM, Narda Bonds, MD, 237 mL at 03/03/23 0949   ferrous sulfate tablet 325 mg, 325 mg, Oral, BID WC, Orland Mustard, MD, 325 mg at 03/03/23 0705   insulin aspart (novoLOG) injection 0-9 Units, 0-9 Units, Subcutaneous, TID WC, Orland Mustard, MD, 2 Units at 03/03/23 1200   levETIRAcetam (KEPPRA) tablet 500 mg, 500 mg, Oral, BID, Rexford Maus, RPH    levothyroxine (SYNTHROID) tablet 88 mcg, 88 mcg, Oral, Q0600, Orland Mustard, MD, 88 mcg at 03/03/23 0516   LORazepam (ATIVAN) injection 4 mg, 4 mg, Intravenous, Q5 Min x 2 PRN, Orland Mustard, MD   metoprolol tartrate (LOPRESSOR) injection 5 mg, 5 mg, Intravenous, Q6H PRN, Orland Mustard, MD   metoprolol tartrate (LOPRESSOR) tablet 25 mg, 25 mg, Oral, BID, Orland Mustard, MD, 25 mg at 03/03/23 0944   traMADol (ULTRAM) tablet 50 mg, 50 mg, Oral, Q6H PRN, Orland Mustard, MD   white petrolatum (VASELINE) gel, , Topical, Daily, Narda Bonds, MD, 0.2 Application at 03/03/23 0944  Patients Current Diet:  Diet Order             Diet regular Room service appropriate? Yes with Assist; Fluid consistency: Thin  Diet effective now                   Precautions / Restrictions Precautions Precautions: Fall Precaution Comments: blind; EEG Restrictions Weight Bearing Restrictions: No   Has the patient had 2 or more falls or a fall with injury in the past year? No  Prior Activity Level Limited Community (1-2x/wk): Went out 1-2 X a week  Prior Functional Level Self Care: Did the patient need help bathing, dressing, using the toilet or eating? Needed some help  Indoor Mobility: Did the patient need assistance with walking from room to room (with or without device)? Independent  Stairs: Did the patient need assistance with internal or external stairs (with or without device)? Independent  Functional Cognition: Did the patient need help planning regular tasks such as shopping or remembering to take medications? Independent  Patient Information Are you of Hispanic, Latino/a,or Spanish origin?: A. No, not of Hispanic, Latino/a, or Spanish origin What is your race?: A. White Do you need or want an interpreter to communicate with a doctor or health care staff?: 0. No  Patient's Response To:  Health Literacy and Transportation Is the patient able to respond to health literacy and  transportation needs?: Yes Health Literacy - How often do you need to have someone help you when you read instructions, pamphlets, or other written material from your doctor or pharmacy?: Always (Secondary to blindness) In the past 12 months, has lack of transportation kept you from medical appointments or from getting medications?: No In the past 12 months, has lack of transportation kept you from meetings, work, or from getting things needed for daily living?: No  Journalist, newspaper / Equipment Home Equipment: Shower seat, Agricultural consultant (2 wheels), Grab bars - tub/shower  Prior Device Use: Indicate devices/aids used by the patient prior to current illness, exacerbation or injury? None of the above  Current Functional Level Cognition  Overall Cognitive Status: Within Functional Limits for tasks assessed Orientation Level: Oriented X4    Extremity Assessment (includes Sensation/Coordination)  Upper Extremity Assessment: RUE deficits/detail RUE Deficits / Details: limited shoulder ROM, can hold against gravity but not against resistance, can flex/ext elbow and wrist, 2/5 grasp, cannot form full fist  Lower Extremity Assessment: Generalized weakness    ADLs  Overall ADL's : Needs assistance/impaired Eating/Feeding: Set up Grooming: Set up, Sitting Upper Body Bathing: Minimal assistance, Sitting Lower Body Bathing: Moderate assistance, Sitting/lateral leans Upper Body Dressing : Minimal assistance, Sitting Lower Body Dressing: Moderate assistance, Sitting/lateral leans Toilet Transfer: Minimal assistance, Moderate assistance Toileting- Clothing Manipulation and Hygiene: Maximal assistance Functional mobility during ADLs: Minimal assistance, Moderate assistance    Mobility  Overal bed mobility: Needs Assistance Bed Mobility: Supine to Sit, Sit to Supine Supine to sit: Min assist Sit to supine: Min assist General bed mobility comments: light min A to eleavte trunk as pt  requesting HHA    Transfers  Overall transfer level: Needs assistance Equipment used: Rolling walker (2 wheels) Transfers: Sit to/from Stand Sit to Stand: Min assist General transfer comment: light min A to boost up to stand    Ambulation / Gait / Stairs / Psychologist, prison and probation services  Ambulation/Gait Pre-gait activities: able to march in place x20 and take a few side steps toward HOB, bil knees flexed in stance    Posture / Balance Dynamic Sitting Balance Sitting balance - Comments: supervision static sitting EOB Balance Overall balance assessment: Needs assistance Sitting-balance support: Feet supported Sitting balance-Leahy Scale: Good Sitting balance - Comments: supervision static sitting EOB Standing balance support: Bilateral upper extremity supported Standing balance-Leahy Scale: Poor Standing balance comment: ue support in standing    Special needs/care consideration Skin Post op dressing to scalp, Diabetic management h/o DM, and Special service needs Note patient is blind.   Previous Home Environment (from acute therapy documentation) Living Arrangements: Spouse/significant other Available Help at Discharge: Family Type of Home: House Home Layout: Multi-level, Able to live on main level with bedroom/bathroom Alternate Level Stairs-Number of Steps: stair lift from basement to main level Home Access: Other (comment) Bathroom Shower/Tub: Health visitor: Handicapped height  Discharge Living Setting Plans for Discharge Living Setting: Patient's home, House, Lives with (comment) (Lives with wife.) Type of Home at Discharge: House Discharge Home Layout: Two level, Laundry or work area in basement Alternate Level Stairs-Number of Steps: Has a stair lift from the basement to the main level. Discharge Home Access: Other (comment) (Stair lift from basement.  Garage is in the basement.) Discharge Bathroom Shower/Tub: Walk-in shower, Door Discharge Bathroom Toilet:  Handicapped height Discharge Bathroom Accessibility: No  Social/Family/Support Systems Patient Roles: Spouse, Parent Contact Information: Chrisangel Furfaro - spouse - [phone] Anticipated Caregiver: Wife Ability/Limitations of Caregiver: Wife is retired and can assist. Medical laboratory scientific officer: 24/7 Discharge Plan Discussed with Primary Caregiver: Yes Is Caregiver In Agreement with Plan?: Yes Does Caregiver/Family have Issues with Lodging/Transportation while Pt is in Rehab?: No  Goals Patient/Family Goal for Rehab: PT/OT supervision goals Expected length of stay: 10-12 days Pt/Family Agrees to Admission and willing to participate: Yes Program Orientation Provided & Reviewed with Pt/Caregiver Including Roles  & Responsibilities: Yes  Decrease burden of Care through IP rehab admission: N/A  Possible need for SNF placement upon discharge: Not planned  Patient Condition: I have reviewed medical records from [facility], spoken with CM, and patient and spouse. I met with patient at the bedside  for inpatient rehabilitation assessment.  Patient will benefit from ongoing PT and OT, can actively participate in 3 hours of therapy a day 5 days of the week, and can make measurable gains during the admission.  Patient will also benefit from the coordinated team approach during an Inpatient Acute Rehabilitation admission.  The patient will receive intensive therapy as well as Rehabilitation physician, nursing, social worker, and care management interventions.  Due to bladder management, bowel management, safety, skin/wound care, disease management, medication administration, pain management, and patient education the patient requires 24 hour a day rehabilitation nursing.  The patient is currently min A with mobility and basic ADLs.  Discharge setting and therapy post discharge at home with home health is anticipated.  Patient has agreed to participate in the Acute Inpatient Rehabilitation Program  and will admit today.  Preadmission Screen Completed By:  Trish Mage, 03/03/2023 3:31 PM ______________________________________________________________________   Discussed status with Dr. Riley Kill on 03/07/23 at 1217 and received approval for admission today.  Admission Coordinator:  Trish Mage, RN, time 1217/Date 03/07/23   Assessment/Plan: Diagnosis: debility/seizures Does the need for close, 24 hr/day Medical supervision in concert with the patient's rehab needs make it unreasonable for this patient to be served in a less intensive setting? Yes Co-Morbidities requiring supervision/potential complications: scalp wound/dressing, CKD, DM Due to bladder management, bowel management, safety, skin/wound care, disease management, medication administration, pain management, and patient education, does the patient require 24 hr/day rehab nursing? Yes Does the patient require coordinated care of a physician, rehab nurse, PT, OT, and SLP to address physical and functional deficits in the context of the above medical diagnosis(es)? Yes Addressing deficits in the following areas: balance, endurance, locomotion, strength, transferring, bowel/bladder control, bathing, dressing, feeding, grooming, toileting, and psychosocial support Can the patient actively participate in an intensive therapy program of at least 3 hrs of therapy 5 days a week? Yes The potential for patient to make measurable gains while on inpatient rehab is excellent Anticipated functional outcomes upon discharge from inpatient rehab: supervision PT, supervision OT, n/a SLP Estimated rehab length of stay to reach the above functional goals is: 9-11 days Anticipated discharge destination: Home 10. Overall Rehab/Functional Prognosis: excellent   MD Signature: Ranelle Oyster, MD, [facility] Physical Medicine & Rehabilitation Medical Director Rehabilitation Services 03/07/2023

## 2023-03-03 NOTE — Procedures (Addendum)
Patient Name: Douglas Melton  MRN: [id number]  Epilepsy Attending: Charlsie Quest  Referring Physician/Provider: Marjorie Smolder, NP  Duration: 03/02/2023 1715 to 03/03/2023 1208   Patient history: 87 y.o. male patient noticed seizure-like activity with painful twitching and shaking of his right arm which lasted about 5 or 6 minutes. Patient was unable to move his arm after this episode. He called EMS and was brought to the hospital, and episodes recurred 3 times. EEG to evaluate for seizure   Level of alertness: Awake, asleep   AEDs during EEG study: LEV   Technical aspects: This EEG study was done with scalp electrodes positioned according to the 10-20 International system of electrode placement. Electrical activity was reviewed with band pass filter of 1-70Hz , sensitivity of 7 uV/mm, display speed of 48mm/sec with a 60Hz  notched filter applied as appropriate. EEG data were recorded continuously and digitally stored.  Video monitoring was available and reviewed as appropriate.   Description: The posterior dominant rhythm consists of 7.5 Hz activity of moderate voltage (25-35 uV) seen predominantly in posterior head regions, symmetric and reactive to eye opening and eye closing. Sleep was characterized by vertex waves, sleep spindles (12 to 14 Hz), maximal frontocentral region. EEG showed intermittent generalized 3-7 Hz theta- delta slowing. Hyperventilation and photic stimulation were not performed.      ABNORMALITY -Intermittent slow, generalized   IMPRESSION: This study is suggestive of mild diffuse encephalopathy. No seizures or epileptiform discharges were seen throughout the recording.   Please note lack of epileptiform activity during interictal EEG does not exclude the diagnosis of epilepsy.   Rubylee Zamarripa Annabelle Harman

## 2023-03-03 NOTE — Progress Notes (Signed)
Physical Therapy Treatment Patient Details Name: Douglas Melton MRN: [id number] DOB: 16-Dec-1935 Today's Date: 03/03/2023   History of Present Illness Pt is an 87 y/o M admitted on 03/01/23 after presenting with c/o sudden onset jerking of RUE concerning for focal seizures. PMH: CKD, DM2, HTN, HLD, hypothyroidism, tobacco abuse, blindness, SCC & fibroxanthoma of the scalp, outer table craniectomy 02/28/23, glaucoma, blind    PT Comments  Pt greeted resting in bed and agreeable to session with slow but steady progress towards acute goals. Pt continues to be limited by bil LE and RUE fatigue and weakness. Pt requiring min A for bed mobility and transfers to stand with pt able to maintain standing ~35mins with pre-gait activities performed before pt reporting max fatigue and needing to sit. BP stable during all positional changes. Pt with noted bil knee flexion in standing with pt unable to correct. Pt performing supine LE therex with cues for technique and education to perform throughout day. Pt tolerating bed in chair position at end of session and encouraged and educated pt on importance of upright sitting with pt verbalizing understanding. Current plan remains appropriate to address deficits and maximize functional independence and decrease caregiver burden. Pt continues to benefit from skilled PT services to progress toward functional mobility goals.     If plan is discharge home, recommend the following: A lot of help with walking and/or transfers;A lot of help with bathing/dressing/bathroom;Assist for transportation;Help with stairs or ramp for entrance;Assistance with cooking/housework   Can travel by private vehicle        Equipment Recommendations  None recommended by PT (defer to next venue)    Recommendations for Other Services       Precautions / Restrictions Precautions Precautions: Fall Precaution Comments: blind; EEG Restrictions Weight Bearing Restrictions: No     Mobility   Bed Mobility Overal bed mobility: Needs Assistance Bed Mobility: Supine to Sit, Sit to Supine     Supine to sit: Min assist Sit to supine: Min assist   General bed mobility comments: light min A to eleavte trunk as pt requesting HHA    Transfers Overall transfer level: Needs assistance Equipment used: Rolling walker (2 wheels) Transfers: Sit to/from Stand Sit to Stand: Min assist           General transfer comment: light min A to boost up to stand    Ambulation/Gait             Pre-gait activities: able to march in place x20 and take a few side steps toward HOB, bil knees flexed in stance     Stairs             Wheelchair Mobility     Tilt Bed    Modified Rankin (Stroke Patients Only)       Balance Overall balance assessment: Needs assistance Sitting-balance support: Feet supported Sitting balance-Leahy Scale: Good Sitting balance - Comments: supervision static sitting EOB   Standing balance support: Bilateral upper extremity supported Standing balance-Leahy Scale: Poor Standing balance comment: ue support in standing                            Cognition Arousal: Alert Behavior During Therapy: WFL for tasks assessed/performed Overall Cognitive Status: Within Functional Limits for tasks assessed  Exercises General Exercises - Lower Extremity Ankle Circles/Pumps: AROM, Both, 10 reps, Supine Hip ABduction/ADduction: AROM, Right, Left, 20 reps, Supine Straight Leg Raises: AROM, Left, Right, 20 reps, Supine    General Comments General comments (skin integrity, edema, etc.): BP stable      Pertinent Vitals/Pain Pain Assessment Pain Assessment: No/denies pain    Home Living                          Prior Function            PT Goals (current goals can now be found in the care plan section) Acute Rehab PT Goals Patient Stated Goal: get better PT Goal  Formulation: With patient Time For Goal Achievement: 03/16/23 Progress towards PT goals: Progressing toward goals    Frequency    Min 1X/week      PT Plan      Co-evaluation              AM-PAC PT "6 Clicks" Mobility   Outcome Measure  Help needed turning from your back to your side while in a flat bed without using bedrails?: A Little Help needed moving from lying on your back to sitting on the side of a flat bed without using bedrails?: A Little Help needed moving to and from a bed to a chair (including a wheelchair)?: A Little Help needed standing up from a chair using your arms (e.g., wheelchair or bedside chair)?: A Little Help needed to walk in hospital room?: A Lot Help needed climbing 3-5 steps with a railing? : Total 6 Click Score: 15    End of Session   Activity Tolerance: Patient limited by fatigue Patient left: in bed;with call bell/phone within reach;with bed alarm set;with family/visitor present;Other (comment) (with bed in partial chair position) Nurse Communication: Mobility status;Other (comment) (BP readings) PT Visit Diagnosis: Muscle weakness (generalized) (M62.81);Difficulty in walking, not elsewhere classified (R26.2);Other abnormalities of gait and mobility (R26.89);Unsteadiness on feet (R26.81)     Time: 0950-1020 PT Time Calculation (min) (ACUTE ONLY): 30 min  Charges:    $Therapeutic Activity: 23-37 mins PT General Charges $$ ACUTE PT VISIT: 1 Visit                     Auset Fritzler R. PTA Acute Rehabilitation Services Office: [phone]   Catalina Antigua 03/03/2023, 10:27 AM

## 2023-03-03 NOTE — Plan of Care (Signed)
  Problem: Education: Goal: Ability to describe self-care measures that may prevent or decrease complications (Diabetes Survival Skills Education) will improve Outcome: Progressing Goal: Individualized Educational Video(s) Outcome: Progressing   

## 2023-03-03 NOTE — Progress Notes (Signed)
LTM EEG discontinued - no skin breakdown at unhook.   

## 2023-03-03 NOTE — Progress Notes (Signed)
Patient was not seen on rounds because he was OTF at MRI. Will follow up with patient and family to explain review results in AM.  Bing Neighbors, MD [facility] [phone]  If 7pm- 7am, please page neurology on call as listed in AMION.

## 2023-03-04 DIAGNOSIS — R569 Unspecified convulsions: Secondary | ICD-10-CM | POA: Diagnosis not present

## 2023-03-04 DIAGNOSIS — I639 Cerebral infarction, unspecified: Secondary | ICD-10-CM

## 2023-03-04 LAB — GLUCOSE, CAPILLARY
Glucose-Capillary: 119 mg/dL — ABNORMAL HIGH (ref 70–99)
Glucose-Capillary: 150 mg/dL — ABNORMAL HIGH (ref 70–99)
Glucose-Capillary: 150 mg/dL — ABNORMAL HIGH (ref 70–99)
Glucose-Capillary: 140 mg/dL — ABNORMAL HIGH (ref 70–99)

## 2023-03-04 MED ORDER — CLOPIDOGREL BISULFATE 75 MG PO TABS
75.0000 mg | ORAL_TABLET | Freq: Every day | ORAL | Status: DC
  Administered 2023-03-04: 75 mg via ORAL
  Filled 2023-03-04: qty 1

## 2023-03-04 MED ORDER — ASPIRIN 81 MG PO TBEC
81.0000 mg | DELAYED_RELEASE_TABLET | Freq: Every day | ORAL | Status: DC
  Administered 2023-03-04 – 2023-03-07 (×4): 81 mg via ORAL
  Filled 2023-03-04 (×4): qty 1

## 2023-03-04 MED ORDER — ROSUVASTATIN CALCIUM 20 MG PO TABS
20.0000 mg | ORAL_TABLET | Freq: Every day | ORAL | Status: DC
  Administered 2023-03-04 – 2023-03-05 (×2): 20 mg via ORAL
  Filled 2023-03-04 (×2): qty 1

## 2023-03-04 NOTE — Plan of Care (Signed)
  Problem: Education: Goal: Ability to describe self-care measures that may prevent or decrease complications (Diabetes Survival Skills Education) will improve Outcome: Progressing Goal: Individualized Educational Video(s) Outcome: Progressing   Problem: Coping: Goal: Ability to adjust to condition or change in health will improve Outcome: Progressing   Problem: Fluid Volume: Goal: Ability to maintain a balanced intake and output will improve Outcome: Progressing   Problem: Health Behavior/Discharge Planning: Goal: Ability to identify and utilize available resources and services will improve Outcome: Progressing Goal: Ability to manage health-related needs will improve Outcome: Progressing   Problem: Metabolic: Goal: Ability to maintain appropriate glucose levels will improve Outcome: Progressing   Problem: Nutritional: Goal: Maintenance of adequate nutrition will improve Outcome: Progressing Goal: Progress toward achieving an optimal weight will improve Outcome: Progressing   Problem: Skin Integrity: Goal: Risk for impaired skin integrity will decrease Outcome: Progressing   Problem: Tissue Perfusion: Goal: Adequacy of tissue perfusion will improve Outcome: Progressing   Problem: Education: Goal: Knowledge of General Education information will improve Description: Including pain rating scale, medication(s)/side effects and non-pharmacologic comfort measures Outcome: Progressing   Problem: Health Behavior/Discharge Planning: Goal: Ability to manage health-related needs will improve Outcome: Progressing   Problem: Clinical Measurements: Goal: Ability to maintain clinical measurements within normal limits will improve Outcome: Progressing Goal: Will remain free from infection Outcome: Progressing Goal: Diagnostic test results will improve Outcome: Progressing Goal: Respiratory complications will improve Outcome: Progressing Goal: Cardiovascular complication will  be avoided Outcome: Progressing   Problem: Activity: Goal: Risk for activity intolerance will decrease Outcome: Progressing   Problem: Nutrition: Goal: Adequate nutrition will be maintained Outcome: Progressing   Problem: Coping: Goal: Level of anxiety will decrease Outcome: Progressing   Problem: Elimination: Goal: Will not experience complications related to bowel motility Outcome: Progressing Goal: Will not experience complications related to urinary retention Outcome: Progressing   Problem: Pain Management: Goal: General experience of comfort will improve Outcome: Progressing   Problem: Safety: Goal: Ability to remain free from injury will improve Outcome: Progressing   Problem: Skin Integrity: Goal: Risk for impaired skin integrity will decrease Outcome: Progressing   Problem: Education: Goal: Knowledge of disease or condition will improve Outcome: Progressing Goal: Understanding of medication regimen will improve Outcome: Progressing Goal: Individualized Educational Video(s) Outcome: Progressing   Problem: Activity: Goal: Ability to tolerate increased activity will improve Outcome: Progressing   Problem: Cardiac: Goal: Ability to achieve and maintain adequate cardiopulmonary perfusion will improve Outcome: Progressing   Problem: Health Behavior/Discharge Planning: Goal: Ability to safely manage health-related needs after discharge will improve Outcome: Progressing   Problem: Education: Goal: Expressions of having a comfortable level of knowledge regarding the disease process will increase Outcome: Progressing   Problem: Coping: Goal: Ability to adjust to condition or change in health will improve Outcome: Progressing Goal: Ability to identify appropriate support needs will improve Outcome: Progressing   Problem: Health Behavior/Discharge Planning: Goal: Compliance with prescribed medication regimen will improve Outcome: Progressing   Problem:  Medication: Goal: Risk for medication side effects will decrease Outcome: Progressing   Problem: Clinical Measurements: Goal: Complications related to the disease process, condition or treatment will be avoided or minimized Outcome: Progressing Goal: Diagnostic test results will improve Outcome: Progressing   Problem: Safety: Goal: Verbalization of understanding the information provided will improve Outcome: Progressing   Problem: Self-Concept: Goal: Level of anxiety will decrease Outcome: Progressing Goal: Ability to verbalize feelings about condition will improve Outcome: Progressing

## 2023-03-04 NOTE — Progress Notes (Addendum)
STROKE TEAM PROGRESS NOTE   BRIEF HPI [name] is a 87 y.o. 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  Most recent surgery was yesterday, when scalp mass was excised, bone scrapings of skull were taken for biopsy and some sort of dermal matrix was placed.  This morning, patient noticed painful shaking of his right arm which was uncontrollable and lasted about 5 or 6 minutes.  Afterwards, he was unable to move his right arm.  He had 3 more of these episodes while in the ambulance and in the emergency department.  He states he has never had a serious head injury, has no family members with seizures and has never had meningitis or encephalitis, never had febrile seizures as a child and did not have any problems with his birth or development.  He has never had seizure before today.  MRI brain with and without contrast on 10/8 demonstrated no intracranial mass lesions to explain seizure.   SIGNIFICANT HOSPITAL EVENTS 11/6: EEG shows intermittent generalized slowing 11/7: MRI shows numerous acute infarcts bilateral frontal and parietal lobes  INTERIM HISTORY/SUBJECTIVE Wife and daughter at bedside. Plan of care discussed.   Initial concern for Afib. EKGS reviewed with cardiology, read as SR with PACs.   On exam, oriented, follows commands, no dysarthria or aphasia, decreased right hand grip and wrist flexion.  Large wound on the vertex with bandage from recent surgery for skin cancer  OBJECTIVE  CBC    Component Value Date/Time   WBC 10.7 (H) 03/02/2023 0440   RBC 3.79 (L) 03/02/2023 0440   HGB 12.0 (L) 03/02/2023 0440   HCT 37.6 (L) 03/02/2023 0440   PLT 309 03/02/2023 0440   MCV 99.2 03/02/2023 0440   MCH 31.7 03/02/2023 0440   MCHC 31.9 03/02/2023 0440   RDW 14.0 03/02/2023 0440    LYMPHSABS 2.0 02/14/2020 1631   MONOABS 0.6 02/14/2020 1631   EOSABS 0.2 02/14/2020 1631   BASOSABS 0.1 02/14/2020 1631    BMET    Component Value Date/Time   NA 139 03/02/2023 0440   K 3.7 03/02/2023 0440   CL 105 03/02/2023 0440   CO2 24 03/02/2023 0440   GLUCOSE 82 03/02/2023 0440   BUN 16 03/02/2023 0440   CREATININE 1.49 (H) 03/02/2023 0440   CALCIUM 8.6 (L) 03/02/2023 0440   GFRNONAA 46 (L) 03/02/2023 0440    IMAGING past 24 hours MR BRAIN W WO CONTRAST  Addendum Date: 03/03/2023   ADDENDUM REPORT: 03/03/2023 19:15 ADDENDUM: These results will be called to the ordering clinician or representative by the Radiologist Assistant, and communication documented in the PACS or [employer]. Electronically Signed   By: Sebastian Ache M.D.   On: 03/03/2023 19:15   Result Date: 03/03/2023 CLINICAL DATA:  New onset seizures. Recent resection of squamous cell carcinoma of the scalp. EXAM: MRI HEAD WITHOUT AND WITH CONTRAST TECHNIQUE: Multiplanar, multiecho pulse sequences of the brain and surrounding structures were obtained without and with intravenous contrast. CONTRAST:  6mL GADAVIST GADOBUTROL 1 MMOL/ML IV SOLN COMPARISON:  Head CT 03/01/2023 FINDINGS: Brain: Numerous small foci of predominantly cortically based restricted diffusion are present in the left greater than right frontal and parietal lobes, greatest towards the vertex. There is no abnormal brain parenchymal or meningeal enhancement, and these are most suggestive of acute infarcts. Patchy  and confluent T2 hyperintensities in the subcortical and deep cerebral white matter bilaterally are nonspecific but compatible with moderate to severe chronic small vessel ischemic disease. There is mild-to-moderate cerebral atrophy. Innumerable chronic microhemorrhages are present peripherally throughout both cerebral hemispheres (greatest posteriorly, particularly in the right occipitotemporal region) and in the cerebellum. The hippocampi are  symmetric in size without a focal signal abnormality identified. There is no midline shift or extra-axial fluid collection. Vascular: Major intracranial vascular flow voids are preserved. Skull and upper cervical spine: Postoperative changes to the frontal vertex scalp with underlying mild mild patchy T1 hypointensity and enhancement of the underlying frontal skull and with mild outer table erosion shown on CT. Presumed overlying dressing. Sinuses/Orbits: Bilateral cataract extraction. Trace right mastoid fluid. Minimal mucosal thickening in the ethmoid sinuses. Other: None. IMPRESSION: 1. Numerous small acute infarcts in the left greater than right frontal and parietal lobes. 2. Moderate to severe chronic small vessel ischemic disease. 3. Innumerable chronic microhemorrhages which may reflect cerebral amyloid angiopathy. 4. Postoperative changes to the frontal vertex scalp with underlying mild calvarial marrow signal abnormality and mild outer table erosion on CT. Marrow findings could be postoperative or secondary to tumor. Correlate with recent surgery and pathology. Electronically Signed: By: Sebastian Ache M.D. On: 03/03/2023 19:13    Vitals:   03/03/23 2038 03/03/23 2040 03/04/23 0013 03/04/23 0403  BP: (!) 180/78  (!) 156/66 (!) 152/72  Pulse: (!) 45 86 71 70  Resp: 18  18 18   Temp: 98 F (36.7 C)  99.2 F (37.3 C) 97.6 F (36.4 C)  TempSrc: Oral  Oral Oral  SpO2: 94%  97% 98%  Weight:      Height:         PHYSICAL EXAM General:  Alert, well-nourished, well-developed patient in no acute distress Psych:  Mood and affect appropriate for situation CV: Regular rate and rhythm on monitor Respiratory:  Regular, unlabored respirations on room air GI: Abdomen soft and nontender  NEURO:  Mental Status: AA&Ox3, patient is able to give clear and coherent history Speech/Language: speech is without dysarthria or aphasia.  Naming, repetition, fluency, and comprehension intact.  Cranial Nerves:   II: Blind bilaterally..  Right no light perception.  Left eye only with intact light perception. III, IV, VI: EOMI. Eyelids elevate symmetrically.  V: Sensation is intact to light touch and symmetrical to face.  VII: Face is symmetrical resting and smiling VIII: hearing intact to voice. IX, X: Palate elevates symmetrically. Phonation is normal.  IO:[id number] shrug 5/5. XII: tongue is midline without fasciculations. Motor: generalized weakness.   Decreased right hand grip and wrist flexion.  Tone: is normal and bulk is normal Sensation- Intact to light touch bilaterally. Intact proprioception and vibratory sense.  Coordination: FTN slightly reduced on right. No drift.  Gait- deferred  ASSESSMENT/PLAN  Acute Ischemic Infarct:  bilateral left > right frontal and parietal lobes Etiology: Embolic from cryptogenic etiology.   CT head   Partially eroded outer table of the skull at the anterior vertex with overlying exophytic mixed density mass which might be postoperative dressing material (uncertain). No craniotomy. No skull fracture or full-thickness calvarium erosion. No acute intracranial abnormality. Generalized cerebral volume loss and moderate for age white matter disease.  MRI   Numerous small acute infarcts in the left greater than right frontal and parietal lobes. Moderate to severe chronic small vessel ischemic disease. Innumerable chronic microhemorrhages which may reflect cerebral amyloid angiopathy. Postoperative changes to the frontal vertex scalp with underlying  mild calvarial marrow signal abnormality and mild outer table erosion on CT. Marrow findings could be postoperative or secondary to tumor. Correlate with recent surgery and pathology.  LTM EEG 11/6 1715 to 11/7 1208: This study is suggestive of mild diffuse encephalopathy. No seizures or epileptiform discharges were seen throughout the recording.  2D Echo: EF 50-55%, Grade I diastolic dysfunction, Mild LVH Recommend  30-day heart monitor on discharge to look for paroxysmal A-fib  LDL  01/18/23 109 mg percent HgbA1c 01/18/2023 5.8 VTE prophylaxis - SCDs No antithrombotic prior to admission, now on aspirin 81 mg daily and clopidogrel 75 mg daily for 3 weeks and then aspirin alone. Therapy recommendations:  CIR Disposition:  pending  Seizure-like activity Multiple episodes of right arm jerking, no other focal seizures symptoms Patient remembers all episodes, no post-ictal periods LTM EEG negative Continue Keppra 500mg  BID due to continued risk for seizures   Hypertension Home meds:  none Stable BP goal normotensive as LKW is outside of permissive hypertension window  Hyperlipidemia Home meds:  none LDL 109 goal < 70 Add Crestor 20mg   Continue statin at discharge  Diabetes type II, controlled Home meds:  glipizide HgbA1c: 5.8, goal < 7.0 CBGs SSI Recommend close follow-up with PCP  Tobacco Abuse Former cigarette smoker  Other Stroke Risk Factors Advanced age  Other Active Problems Blindness SCC of scalp S/p wide local excision and craniectomy CKD IIIa Hypothyroidism Home synthroid continued  Hospital day # 2   Pt seen by Neuro NP/APP and later by MD. Note/plan to be edited by MD as needed.    Lynnae January, DNP, AGACNP-BC [facility] Please use AMION for contact information & EPIC for messaging.  STROKE MD NOTE :  I have personally obtained history,examined this patient, reviewed notes, independently viewed imaging studies, participated in medical decision making and plan of care.ROS completed by me personally and pertinent positives fully documented  I have made any additions or clarifications directly to the above note. Agree with note above.  Patient presented with painful jerking movements in the right upper extremity with preserved consciousness and without any facial involvement possibly myoclonic tremor versus hemiballismus initially thought to have focal  seizures and started Keppra however MRI scan shows bicerebral embolic infarcts in frontal and parietal lobes.  Continue Keppra for now for possible partial seizures.  Recommend ongoing stroke workup.  Continue cardiac monitoring.  Patient is likely going to need prolonged cardiac monitoring for A-fib after discharge.  Recommend aspirin alone for stroke prevention given recent surgery yesterday for large wound will not add Plavix at this time.  Statin for elevated LDL.  Physical Occupational Therapy consults.  Long discussion with patient and daughter and wife at the bedside and answered questions.  Discussed with Dr. Caleb Popp. Greater than 50% time during this 50-minute visit was spent on counseling and coordination of care about his by cerebral embolic infarcts as well as right hand involuntary movement and answered questions. Delia Heady, MD Medical Director [facility] Pager: [phone] 03/04/2023 3:13 PM   To contact Stroke Continuity provider, please refer to [email or web address]. After hours, contact General Neurology

## 2023-03-04 NOTE — Progress Notes (Signed)
Occupational Therapy Treatment Patient Details Name: Douglas Melton MRN: [id number] DOB: 01-09-1936 Today's Date: 03/04/2023   History of present illness Pt is an 87 y/o M admitted on 03/01/23 after presenting with c/o sudden onset jerking of RUE concerning for focal seizures. 03/03/2023 MRI showed numerous small acute infarcts in left greater than right frontal and parietal lobes with moderate/severe chronic small vessel ischemic disease and chronic microhemorrhages which may reflect cerebral amyloid angiopathy  PMH: CKD, DM2, HTN, HLD, hypothyroidism, tobacco abuse, blindness, SCC & fibroxanthoma of the scalp, outer table craniectomy 02/28/23, glaucoma, blind   OT comments  Pt reports fatigue having been up to the chair earlier today and just back to bed with PT. Pt with improved grip strength in R hand, provided firm foam block, replacing extra soft. Issued insulated cup with lid and handle and instructed in use with hot beverages, pt agreed to use over the weekend.       If plan is discharge home, recommend the following:  A lot of help with bathing/dressing/bathroom;A little help with walking and/or transfers;Direct supervision/assist for medications management;Direct supervision/assist for financial management;Assist for transportation;Help with stairs or ramp for entrance;Assistance with cooking/housework   Equipment Recommendations  Other (comment) (defer)    Recommendations for Other Services      Precautions / Restrictions Precautions Precautions: Fall Precaution Comments: blind Restrictions Weight Bearing Restrictions: No       Mobility Bed Mobility                    Transfers                         Balance                                           ADL either performed or assessed with clinical judgement   ADL                                         General ADL Comments: Issued and educated in use of insulated  cup with lid and handle. Instructed to use handle on R and both hands when drinking hot beverages for safety    Extremity/Trunk Assessment Upper Extremity Assessment Upper Extremity Assessment: RUE deficits/detail RUE Deficits / Details: issued firm foam hand strengthener as xtra soft had gotten too easy, encouraged gross grip with thumb opposed and pinching RUE Coordination: decreased fine motor;decreased gross motor            Vision       Perception     Praxis      Cognition Arousal: Alert Behavior During Therapy: WFL for tasks assessed/performed Overall Cognitive Status: Within Functional Limits for tasks assessed                                 General Comments: pt somewhat resistant to new learning        Exercises      Shoulder Instructions       General Comments      Pertinent Vitals/ Pain       Pain Assessment Pain Assessment: No/denies pain  Home Living  Prior Functioning/Environment              Frequency  Min 1X/week        Progress Toward Goals  OT Goals(current goals can now be found in the care plan section)  Progress towards OT goals: Progressing toward goals  Acute Rehab OT Goals OT Goal Formulation: With patient Time For Goal Achievement: 03/16/23 Potential to Achieve Goals: Good  Plan      Co-evaluation                 AM-PAC OT "6 Clicks" Daily Activity     Outcome Measure   Help from another person eating meals?: A Little Help from another person taking care of personal grooming?: A Little Help from another person toileting, which includes using toliet, bedpan, or urinal?: A Lot Help from another person bathing (including washing, rinsing, drying)?: A Lot Help from another person to put on and taking off regular upper body clothing?: A Lot Help from another person to put on and taking off regular lower body clothing?: A Lot 6 Click  Score: 14    End of Session    OT Visit Diagnosis: Unsteadiness on feet (R26.81);Other abnormalities of gait and mobility (R26.89);Muscle weakness (generalized) (M62.81)   Activity Tolerance Patient tolerated treatment well   Patient Left in bed;with call bell/phone within reach;with bed alarm set;with family/visitor present;with nursing/sitter in room   Nurse Communication          Time: 4098-1191 OT Time Calculation (min): 27 min  Charges: OT General Charges $OT Visit: 1 Visit OT Treatments $Self Care/Home Management : 8-22 mins $Therapeutic Exercise: 8-22 mins  Berna Spare, OTR/L Acute Rehabilitation Services Office: [phone]   Evern Bio 03/04/2023, 2:00 PM

## 2023-03-04 NOTE — Progress Notes (Incomplete Revision)
PROGRESS NOTE    Douglas Melton  WUJ:[id number] DOB: 1935-05-11 DOA: 03/01/2023 PCP: Barbie Banner, MD   Brief Narrative: Douglas Melton is a 87 y.o. 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  Patient presented secondary to seizure-like activity. Neurology consulted. Keppra started and EEG ordered, which did not capture evidence of seizure activity. MRI brain obtained showing multiple acute strokes, concerning for embolic etiology.   Assessment and Plan:  Seizure-like activity Patient with right arm movements concerning for possible focal seizures. Neurology consulted and are not convinced this is related to recent craniectomy. Long-term EEG ordered and patient loaded on Keppra. EEG without evidence of seizure or epileptiform discharges. MRI obtained and are significant for multiple strokes. -Ongoing neurology recommendations: pending today  Acute ischemic CVA Multiple infarcts suggesting embolic pattern. MRI brain confirms numerous small acute infarcts in left greater than right frontal and parietal lobes with moderate/severe chronic small vessel ischemic disease and chronic microhemorrhages which may reflect cerebral amyloid angiopathy. In setting of newly diagnosed atrial fibrillation. LDL pending. Hemoglobin A1C pending. Transthoracic Echocardiogram significant for low-normal LVEF of 50-55% with grade 1 diastolic dysfunction; interatrial septum not well visualized. Neurology recommendations pending. PT/OT recommendations for acute inpatient rehabilitation. -Follow-up neurology recommendations  Atrial fibrillation New-onset, noted on admission. Patient initially declined anticoagulation but is now considering initiation in setting of newly diagnosed stroke, likely embolic. Rhythm on telemetry does not appear consistent  with atrial fibrillation. EKGs obtained not suggestive of atrial fibrillation ***. 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  CKD stage IIIa Stable.  Diabetes mellitus type 2 Controlled with hemoglobin A1C of 5.8%. Glipizide held. -Continue SSI  Hypothyroidism -Continue Synthroid  Blindness Noted.  Failure to thrive Severe malnutrition Underweight Noted. Dietitian consulted. Estimated body mass index is 17.12 kg/m as calculated from the following:   Height as of this encounter: 6\' 2"  (1.88 m).   Weight as of this encounter: 60.5 kg.   DVT prophylaxis: SCDs Code Status:   Code Status: Full Code Family Communication: Wife and daughter at bedside Disposition Plan: Discharge likely to rehabilitation pending ongoing neurology recommendations   Consultants:  Neurology  Procedures:  EEG  Antimicrobials: None    Subjective: Patient without specific concern this morning.  Objective: BP (!) 152/72 (BP Location: Right Arm)   Pulse 70   Temp 97.6 F (36.4 C) (Oral)   Resp 18   Ht 6\' 2"  (1.88 m)   Wt 60.5 kg   SpO2 98%   BMI 17.12 kg/m   Examination:  General exam: Appears calm and comfortable Respiratory system: Clear to auscultation. Respiratory effort normal. Cardiovascular system: S1 & S2 heard, RRR. Gastrointestinal system: Abdomen is nondistended, soft and nontender.  Normal bowel sounds heard. Central nervous system: Alert and oriented. Skin: Craniectomy wound noted with overlying dressing Psychiatry: Judgement and insight appear normal. Mood & affect appropriate.    Data Reviewed: I have personally reviewed following labs and imaging studies  CBC Lab Results  Component Value Date   WBC 10.7 (H) 03/02/2023   RBC 3.79 (L) 03/02/2023   HGB 12.0 (L) 03/02/2023   HCT 37.6 (L) 03/02/2023   MCV 99.2  03/02/2023   MCH 31.7 03/02/2023   PLT 309 03/02/2023   MCHC 31.9 03/02/2023  RDW 14.0 03/02/2023   LYMPHSABS 2.0 02/14/2020   MONOABS 0.6 02/14/2020   EOSABS 0.2 02/14/2020   BASOSABS 0.1 02/14/2020     Last metabolic panel Lab Results  Component Value Date   NA 139 03/02/2023   K 3.7 03/02/2023   CL 105 03/02/2023   CO2 24 03/02/2023   BUN 16 03/02/2023   CREATININE 1.49 (H) 03/02/2023   GLUCOSE 82 03/02/2023   GFRNONAA 46 (L) 03/02/2023   GFRAA 57 (L) 11/03/2018   CALCIUM 8.6 (L) 03/02/2023   PHOS 2.9 02/20/2020   PROT 6.2 (L) 03/01/2023   ALBUMIN 3.1 (L) 03/01/2023   BILITOT 0.9 03/01/2023   ALKPHOS 83 03/01/2023   AST 17 03/01/2023   ALT 9 03/01/2023   ANIONGAP 10 03/02/2023    GFR: Estimated Creatinine Clearance: 31 mL/min (A) (by C-G formula based on SCr of 1.49 mg/dL (H)).  No results found for this or any previous visit (from the past 240 hour(s)).    Radiology Studies: MR BRAIN W WO CONTRAST  Addendum Date: 03/03/2023   ADDENDUM REPORT: 03/03/2023 19:15 ADDENDUM: These results will be called to the ordering clinician or representative by the Radiologist Assistant, and communication documented in the PACS or [employer]. Electronically Signed   By: Sebastian Ache M.D.   On: 03/03/2023 19:15   Result Date: 03/03/2023 CLINICAL DATA:  New onset seizures. Recent resection of squamous cell carcinoma of the scalp. EXAM: MRI HEAD WITHOUT AND WITH CONTRAST TECHNIQUE: Multiplanar, multiecho pulse sequences of the brain and surrounding structures were obtained without and with intravenous contrast. CONTRAST:  6mL GADAVIST GADOBUTROL 1 MMOL/ML IV SOLN COMPARISON:  Head CT 03/01/2023 FINDINGS: Brain: Numerous small foci of predominantly cortically based restricted diffusion are present in the left greater than right frontal and parietal lobes, greatest towards the vertex. There is no abnormal brain parenchymal or meningeal enhancement, and these are most suggestive of  acute infarcts. Patchy and confluent T2 hyperintensities in the subcortical and deep cerebral white matter bilaterally are nonspecific but compatible with moderate to severe chronic small vessel ischemic disease. There is mild-to-moderate cerebral atrophy. Innumerable chronic microhemorrhages are present peripherally throughout both cerebral hemispheres (greatest posteriorly, particularly in the right occipitotemporal region) and in the cerebellum. The hippocampi are symmetric in size without a focal signal abnormality identified. There is no midline shift or extra-axial fluid collection. Vascular: Major intracranial vascular flow voids are preserved. Skull and upper cervical spine: Postoperative changes to the frontal vertex scalp with underlying mild mild patchy T1 hypointensity and enhancement of the underlying frontal skull and with mild outer table erosion shown on CT. Presumed overlying dressing. Sinuses/Orbits: Bilateral cataract extraction. Trace right mastoid fluid. Minimal mucosal thickening in the ethmoid sinuses. Other: None. IMPRESSION: 1. Numerous small acute infarcts in the left greater than right frontal and parietal lobes. 2. Moderate to severe chronic small vessel ischemic disease. 3. Innumerable chronic microhemorrhages which may reflect cerebral amyloid angiopathy. 4. Postoperative changes to the frontal vertex scalp with underlying mild calvarial marrow signal abnormality and mild outer table erosion on CT. Marrow findings could be postoperative or secondary to tumor. Correlate with recent surgery and pathology. Electronically Signed: By: Sebastian Ache M.D. On: 03/03/2023 19:13   ECHOCARDIOGRAM COMPLETE  Result Date: 03/02/2023    ECHOCARDIOGRAM REPORT   Patient Name:   SHERILL HARKER Date of Exam: 03/02/2023 Medical Rec #:  [id number]       Height:       74.0 in Accession #:  [id number]      Weight:       150.0 lb Date of Birth:  [birth year]       BSA:          1.923 m Patient Age:    87  years        BP:           145/65 mmHg Patient Gender: M               HR:           85 bpm. Exam Location:  Inpatient Procedure: 2D Echo, Cardiac Doppler and Color Doppler Indications:    Atrial Fibrillation  History:        Patient has no prior history of Echocardiogram examinations.                 Risk Factors:Diabetes and Former Smoker.  Sonographer:    Karma Ganja Referring Phys: [id number] Orland Mustard  Sonographer Comments: Technically challenging study due to limited acoustic windows and suboptimal subcostal window. Image acquisition challenging due to patient body habitus. IMPRESSIONS  1. Left ventricular ejection fraction, by estimation, is 50 to 55%. The left ventricle has low normal function. Left ventricular endocardial border not optimally defined to evaluate regional wall motion. There is mild left ventricular hypertrophy. Left ventricular diastolic parameters are consistent with Grade I diastolic dysfunction (impaired relaxation).  2. Right ventricular systolic function is normal. The right ventricular size is normal. Tricuspid regurgitation signal is inadequate for assessing PA pressure.  3. The mitral valve is degenerative. No evidence of mitral valve regurgitation. No evidence of mitral stenosis. Severe mitral annular calcification.  4. The aortic valve was not well visualized. Aortic valve regurgitation is not visualized. No aortic stenosis is present. FINDINGS  Left Ventricle: Left ventricular ejection fraction, by estimation, is 50 to 55%. The left ventricle has low normal function. Left ventricular endocardial border not optimally defined to evaluate regional wall motion. The left ventricular internal cavity  size was normal in size. There is mild left ventricular hypertrophy. Left ventricular diastolic parameters are consistent with Grade I diastolic dysfunction (impaired relaxation). Right Ventricle: The right ventricular size is normal. Right vetricular wall thickness was not well  visualized. Right ventricular systolic function is normal. Tricuspid regurgitation signal is inadequate for assessing PA pressure. Left Atrium: Left atrial size was normal in size. Right Atrium: Right atrial size was normal in size. Pericardium: There is no evidence of pericardial effusion. Mitral Valve: The mitral valve is degenerative in appearance. Severe mitral annular calcification. No evidence of mitral valve regurgitation. No evidence of mitral valve stenosis. Tricuspid Valve: The tricuspid valve is normal in structure. Tricuspid valve regurgitation is trivial. Aortic Valve: The aortic valve was not well visualized. Aortic valve regurgitation is not visualized. No aortic stenosis is present. Aortic valve mean gradient measures 1.7 mmHg. Aortic valve peak gradient measures 3.0 mmHg. Aortic valve area, by VTI measures 3.03 cm. Pulmonic Valve: The pulmonic valve was not well visualized. Pulmonic valve regurgitation is not visualized. Aorta: The aortic root is normal in size and structure. IAS/Shunts: The interatrial septum was not well visualized.  LEFT VENTRICLE PLAX 2D LVIDd:         4.10 cm   Diastology LVIDs:         3.00 cm   LV e' medial:    4.57 cm/s LV PW:         1.10 cm   LV E/e' medial:  14.1 LV IVS:  1.10 cm   LV e' lateral:   6.09 cm/s LVOT diam:     2.00 cm   LV E/e' lateral: 10.5 LV SV:         45 LV SV Index:   23 LVOT Area:     3.14 cm  RIGHT VENTRICLE             IVC RV Basal diam:  2.90 cm     IVC diam: 0.90 cm RV S prime:     12.97 cm/s TAPSE (M-mode): 2.0 cm LEFT ATRIUM             Index        RIGHT ATRIUM           Index LA diam:        4.00 cm 2.08 cm/m   RA Area:     12.20 cm LA Vol (A2C):   44.1 ml 22.93 ml/m  RA Volume:   28.30 ml  14.71 ml/m LA Vol (A4C):   29.1 ml 15.13 ml/m LA Biplane Vol: 37.9 ml 19.71 ml/m  AORTIC VALVE AV Area (Vmax):    2.62 cm AV Area (Vmean):   2.62 cm AV Area (VTI):     3.03 cm AV Vmax:           86.57 cm/s AV Vmean:          60.433 cm/s AV  VTI:            0.148 m AV Peak Grad:      3.0 mmHg AV Mean Grad:      1.7 mmHg LVOT Vmax:         72.23 cm/s LVOT Vmean:        50.367 cm/s LVOT VTI:          0.143 m LVOT/AV VTI ratio: 0.96  AORTA Ao Root diam: 2.55 cm MITRAL VALVE MV Area (PHT): 4.64 cm    SHUNTS MV Decel Time: 164 msec    Systemic VTI:  0.14 m MV E velocity: 64.26 cm/s  Systemic Diam: 2.00 cm MV A velocity: 95.70 cm/s MV E/A ratio:  0.67 Epifanio Lesches MD Electronically signed by Epifanio Lesches MD Signature Date/Time: 03/02/2023/12:17:31 PM    Final       LOS: 2 days    Jacquelin Hawking, MD [facility] 03/04/2023, 8:52 AM   If 7PM-7AM, please contact night-coverage [email or web address]

## 2023-03-04 NOTE — Progress Notes (Signed)
Inpatient Rehab Admissions Coordinator:  Awaiting insurance authorization and medical readiness. Will continue to follow.   Wolfgang Phoenix, MS, CCC-SLP Admissions Coordinator [phone]

## 2023-03-04 NOTE — Progress Notes (Signed)
Physical Therapy Treatment Patient Details Name: Douglas Melton MRN: [id number] DOB: 1935-07-15 Today's Date: 03/04/2023   History of Present Illness Pt is an 87 y/o M admitted on 03/01/23 after presenting with c/o sudden onset jerking of RUE concerning for focal seizures. 03/03/2023 MRI showed numerous small acute infarcts in left greater than right frontal and parietal lobes with moderate/severe chronic small vessel ischemic disease and chronic microhemorrhages which may reflect cerebral amyloid angiopathy  PMH: CKD, DM2, HTN, HLD, hypothyroidism, tobacco abuse, blindness, SCC & fibroxanthoma of the scalp, outer table craniectomy 02/28/23, glaucoma, blind    PT Comments  Pt greeted up in chair on arrival, requesting to return to bed. Pt able to come to stand with light min A to steady on rise x3 throughout session from recliner and EOB at lowest height. Pt requiring min A for step pivot transfer chair>EOB and able to progress short gait away from and back to EOB with min A and HHA on R. Standing LE therex focused on strength and increased activity tolerance. Pt continues to be limited by fatigue and weakness and current plan remains appropriate to address deficits and maximize functional independence and decrease caregiver burden. Pt continues to benefit from skilled PT services to progress toward functional mobility goals.     If plan is discharge home, recommend the following: A lot of help with walking and/or transfers;A lot of help with bathing/dressing/bathroom;Assist for transportation;Help with stairs or ramp for entrance;Assistance with cooking/housework   Can travel by private vehicle        Equipment Recommendations  None recommended by PT (defer to next venue)    Recommendations for Other Services       Precautions / Restrictions Precautions Precautions: Fall Precaution Comments: blind Restrictions Weight Bearing Restrictions: No     Mobility  Bed Mobility Overal bed  mobility: Needs Assistance Bed Mobility: Sit to Supine       Sit to supine: Min assist   General bed mobility comments: light min A to return LEs to bed    Transfers Overall transfer level: Needs assistance Equipment used: None, 1 person hand held assist Transfers: Sit to/from Stand, Bed to chair/wheelchair/BSC Sit to Stand: Min assist   Step pivot transfers: Min assist       General transfer comment: light min A to boost up to stand with HHA from recliner x1 and EOB x2, min A to step pivot to EOB    Ambulation/Gait Ambulation/Gait assistance: Min assist Gait Distance (Feet): 5 Feet Assistive device: 1 person hand held assist Gait Pattern/deviations: Step-through pattern, Decreased stride length, Narrow base of support Gait velocity: decr     General Gait Details: short gait 5' forward/back from/to bed to window with HHA on R, very short shuffling steps with narrow BOS, increased cues for safety due to pt vision impairment at baseline   Stairs             Wheelchair Mobility     Tilt Bed    Modified Rankin (Stroke Patients Only)       Balance Overall balance assessment: Needs assistance Sitting-balance support: Feet supported Sitting balance-Leahy Scale: Good Sitting balance - Comments: supervision static sitting EOB   Standing balance support: Bilateral upper extremity supported Standing balance-Leahy Scale: Poor Standing balance comment: ue support during dynamic tasks, able to maintain static standing without UE support  Cognition Arousal: Alert Behavior During Therapy: WFL for tasks assessed/performed Overall Cognitive Status: Within Functional Limits for tasks assessed                                          Exercises Other Exercises Other Exercises: standing marching x20    General Comments        Pertinent Vitals/Pain Pain Assessment Pain Assessment: No/denies pain    Home  Living                          Prior Function            PT Goals (current goals can now be found in the care plan section) Acute Rehab PT Goals Patient Stated Goal: get better PT Goal Formulation: With patient Time For Goal Achievement: 03/16/23 Progress towards PT goals: Progressing toward goals    Frequency    Min 1X/week      PT Plan      Co-evaluation              AM-PAC PT "6 Clicks" Mobility   Outcome Measure  Help needed turning from your back to your side while in a flat bed without using bedrails?: A Little Help needed moving from lying on your back to sitting on the side of a flat bed without using bedrails?: A Little Help needed moving to and from a bed to a chair (including a wheelchair)?: A Little Help needed standing up from a chair using your arms (e.g., wheelchair or bedside chair)?: A Little Help needed to walk in hospital room?: A Lot Help needed climbing 3-5 steps with a railing? : Total 6 Click Score: 15    End of Session Equipment Utilized During Treatment: Gait belt Activity Tolerance: Patient limited by fatigue Patient left: in bed;with call bell/phone within reach;with bed alarm set;with family/visitor present Nurse Communication: Mobility status PT Visit Diagnosis: Muscle weakness (generalized) (M62.81);Difficulty in walking, not elsewhere classified (R26.2);Other abnormalities of gait and mobility (R26.89);Unsteadiness on feet (R26.81)     Time: 1147-1200 PT Time Calculation (min) (ACUTE ONLY): 13 min  Charges:    $Therapeutic Exercise: 8-22 mins PT General Charges $$ ACUTE PT VISIT: 1 Visit                     Chelcy Bolda R. PTA Acute Rehabilitation Services Office: [phone]   Catalina Antigua 03/04/2023, 12:11 PM

## 2023-03-04 NOTE — Progress Notes (Signed)
Multiple embolic appearing strokes on MRI. Stroke team to follow.   Bing Neighbors, MD [facility] [phone]  If 7pm- 7am, please page neurology on call as listed in AMION.

## 2023-03-04 NOTE — Progress Notes (Signed)
PROGRESS NOTE    Douglas Melton  ZOX:[id number] DOB: [birth year] DOA: 03/01/2023 PCP: Barbie Banner, MD   Brief Narrative: Douglas Melton is a 87 y.o. 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  Patient presented secondary to seizure-like activity. Neurology consulted. Keppra started and EEG ordered, which did not capture evidence of seizure activity. MRI brain obtained showing multiple acute strokes, concerning for embolic etiology.   Assessment and Plan:  Seizure-like activity Patient with right arm movements concerning for possible focal seizures. Neurology consulted and are not convinced this is related to recent craniectomy. Long-term EEG ordered and patient loaded on Keppra. EEG without evidence of seizure or epileptiform discharges. MRI obtained and are significant for multiple strokes. -Ongoing neurology recommendations: pending today  Acute ischemic CVA Multiple infarcts suggesting embolic pattern. MRI brain confirms numerous small acute infarcts in left greater than right frontal and parietal lobes with moderate/severe chronic small vessel ischemic disease and chronic microhemorrhages which may reflect cerebral amyloid angiopathy. In setting of newly diagnosed atrial fibrillation. LDL pending. Hemoglobin A1C pending. Transthoracic Echocardiogram significant for low-normal LVEF of 50-55% with grade 1 diastolic dysfunction; interatrial septum not well visualized. Neurology recommendations pending. PT/OT recommendations for acute inpatient rehabilitation. -Follow-up neurology recommendations  Sinus tachycardia with PACs Initial concern for possible atrial fibrillation. Rhythm on telemetry did not appear consistent with atrial fibrillation. EKGs obtained not suggestive of atrial fibrillation. Discussed with cardiology  service who agree there is no evidence of atrial fibrillation. 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  CKD stage IIIa Stable.  Diabetes mellitus type 2 Controlled with hemoglobin A1C of 5.8%. Glipizide held. -Continue SSI  Hypothyroidism -Continue Synthroid  Blindness Noted.  Failure to thrive Severe malnutrition Underweight Noted. Dietitian consulted. Estimated body mass index is 17.12 kg/m as calculated from the following:   Height as of this encounter: 6\' 2"  (1.88 m).   Weight as of this encounter: 60.5 kg.   DVT prophylaxis: SCDs Code Status:   Code Status: Full Code Family Communication: Wife and daughter at bedside Disposition Plan: Discharge likely to rehabilitation pending ongoing neurology recommendations   Consultants:  Neurology  Procedures:  EEG  Antimicrobials: None    Subjective: Patient without specific concern this morning.  Objective: BP (!) 152/72 (BP Location: Right Arm)   Pulse 70   Temp 97.6 F (36.4 C) (Oral)   Resp 18   Ht 6\' 2"  (1.88 m)   Wt 60.5 kg   SpO2 98%   BMI 17.12 kg/m   Examination:  General exam: Appears calm and comfortable Respiratory system: Clear to auscultation. Respiratory effort normal. Cardiovascular system: S1 & S2 heard, RRR. Gastrointestinal system: Abdomen is nondistended, soft and nontender.  Normal bowel sounds heard. Central nervous system: Alert and oriented. Skin: Craniectomy wound noted with overlying dressing Psychiatry: Judgement and insight appear normal. Mood & affect appropriate.    Data Reviewed: I have personally reviewed following labs and imaging studies  CBC Lab Results  Component Value Date   WBC 10.7 (H) 03/02/2023   RBC 3.79 (L) 03/02/2023   HGB 12.0 (L) 03/02/2023   HCT 37.6 (L) 03/02/2023   MCV 99.2 03/02/2023   MCH  31.7 03/02/2023   PLT 309 03/02/2023   MCHC 31.9 03/02/2023  RDW 14.0 03/02/2023   LYMPHSABS 2.0 02/14/2020   MONOABS 0.6 02/14/2020   EOSABS 0.2 02/14/2020   BASOSABS 0.1 02/14/2020     Last metabolic panel Lab Results  Component Value Date   NA 139 03/02/2023   K 3.7 03/02/2023   CL 105 03/02/2023   CO2 24 03/02/2023   BUN 16 03/02/2023   CREATININE 1.49 (H) 03/02/2023   GLUCOSE 82 03/02/2023   GFRNONAA 46 (L) 03/02/2023   GFRAA 57 (L) 11/03/2018   CALCIUM 8.6 (L) 03/02/2023   PHOS 2.9 02/20/2020   PROT 6.2 (L) 03/01/2023   ALBUMIN 3.1 (L) 03/01/2023   BILITOT 0.9 03/01/2023   ALKPHOS 83 03/01/2023   AST 17 03/01/2023   ALT 9 03/01/2023   ANIONGAP 10 03/02/2023    GFR: Estimated Creatinine Clearance: 31 mL/min (A) (by C-G formula based on SCr of 1.49 mg/dL (H)).  No results found for this or any previous visit (from the past 240 hour(s)).    Radiology Studies: MR BRAIN W WO CONTRAST  Addendum Date: 03/03/2023   ADDENDUM REPORT: 03/03/2023 19:15 ADDENDUM: These results will be called to the ordering clinician or representative by the Radiologist Assistant, and communication documented in the PACS or [employer]. Electronically Signed   By: Sebastian Ache M.D.   On: 03/03/2023 19:15   Result Date: 03/03/2023 CLINICAL DATA:  New onset seizures. Recent resection of squamous cell carcinoma of the scalp. EXAM: MRI HEAD WITHOUT AND WITH CONTRAST TECHNIQUE: Multiplanar, multiecho pulse sequences of the brain and surrounding structures were obtained without and with intravenous contrast. CONTRAST:  6mL GADAVIST GADOBUTROL 1 MMOL/ML IV SOLN COMPARISON:  Head CT 03/01/2023 FINDINGS: Brain: Numerous small foci of predominantly cortically based restricted diffusion are present in the left greater than right frontal and parietal lobes, greatest towards the vertex. There is no abnormal brain parenchymal or meningeal enhancement, and these are most suggestive of acute infarcts.  Patchy and confluent T2 hyperintensities in the subcortical and deep cerebral white matter bilaterally are nonspecific but compatible with moderate to severe chronic small vessel ischemic disease. There is mild-to-moderate cerebral atrophy. Innumerable chronic microhemorrhages are present peripherally throughout both cerebral hemispheres (greatest posteriorly, particularly in the right occipitotemporal region) and in the cerebellum. The hippocampi are symmetric in size without a focal signal abnormality identified. There is no midline shift or extra-axial fluid collection. Vascular: Major intracranial vascular flow voids are preserved. Skull and upper cervical spine: Postoperative changes to the frontal vertex scalp with underlying mild mild patchy T1 hypointensity and enhancement of the underlying frontal skull and with mild outer table erosion shown on CT. Presumed overlying dressing. Sinuses/Orbits: Bilateral cataract extraction. Trace right mastoid fluid. Minimal mucosal thickening in the ethmoid sinuses. Other: None. IMPRESSION: 1. Numerous small acute infarcts in the left greater than right frontal and parietal lobes. 2. Moderate to severe chronic small vessel ischemic disease. 3. Innumerable chronic microhemorrhages which may reflect cerebral amyloid angiopathy. 4. Postoperative changes to the frontal vertex scalp with underlying mild calvarial marrow signal abnormality and mild outer table erosion on CT. Marrow findings could be postoperative or secondary to tumor. Correlate with recent surgery and pathology. Electronically Signed: By: Sebastian Ache M.D. On: 03/03/2023 19:13   ECHOCARDIOGRAM COMPLETE  Result Date: 03/02/2023    ECHOCARDIOGRAM REPORT   Patient Name:   Douglas Melton Date of Exam: 03/02/2023 Medical Rec #:  [id number]       Height:       74.0 in Accession #:  [id number]      Weight:       150.0 lb Date of Birth:  26-Mar-1936       BSA:          1.923 m Patient Age:    87 years        BP:            145/65 mmHg Patient Gender: M               HR:           85 bpm. Exam Location:  Inpatient Procedure: 2D Echo, Cardiac Doppler and Color Doppler Indications:    Atrial Fibrillation  History:        Patient has no prior history of Echocardiogram examinations.                 Risk Factors:Diabetes and Former Smoker.  Sonographer:    Karma Ganja Referring Phys: [id number] Orland Mustard  Sonographer Comments: Technically challenging study due to limited acoustic windows and suboptimal subcostal window. Image acquisition challenging due to patient body habitus. IMPRESSIONS  1. Left ventricular ejection fraction, by estimation, is 50 to 55%. The left ventricle has low normal function. Left ventricular endocardial border not optimally defined to evaluate regional wall motion. There is mild left ventricular hypertrophy. Left ventricular diastolic parameters are consistent with Grade I diastolic dysfunction (impaired relaxation).  2. Right ventricular systolic function is normal. The right ventricular size is normal. Tricuspid regurgitation signal is inadequate for assessing PA pressure.  3. The mitral valve is degenerative. No evidence of mitral valve regurgitation. No evidence of mitral stenosis. Severe mitral annular calcification.  4. The aortic valve was not well visualized. Aortic valve regurgitation is not visualized. No aortic stenosis is present. FINDINGS  Left Ventricle: Left ventricular ejection fraction, by estimation, is 50 to 55%. The left ventricle has low normal function. Left ventricular endocardial border not optimally defined to evaluate regional wall motion. The left ventricular internal cavity  size was normal in size. There is mild left ventricular hypertrophy. Left ventricular diastolic parameters are consistent with Grade I diastolic dysfunction (impaired relaxation). Right Ventricle: The right ventricular size is normal. Right vetricular wall thickness was not well visualized. Right ventricular  systolic function is normal. Tricuspid regurgitation signal is inadequate for assessing PA pressure. Left Atrium: Left atrial size was normal in size. Right Atrium: Right atrial size was normal in size. Pericardium: There is no evidence of pericardial effusion. Mitral Valve: The mitral valve is degenerative in appearance. Severe mitral annular calcification. No evidence of mitral valve regurgitation. No evidence of mitral valve stenosis. Tricuspid Valve: The tricuspid valve is normal in structure. Tricuspid valve regurgitation is trivial. Aortic Valve: The aortic valve was not well visualized. Aortic valve regurgitation is not visualized. No aortic stenosis is present. Aortic valve mean gradient measures 1.7 mmHg. Aortic valve peak gradient measures 3.0 mmHg. Aortic valve area, by VTI measures 3.03 cm. Pulmonic Valve: The pulmonic valve was not well visualized. Pulmonic valve regurgitation is not visualized. Aorta: The aortic root is normal in size and structure. IAS/Shunts: The interatrial septum was not well visualized.  LEFT VENTRICLE PLAX 2D LVIDd:         4.10 cm   Diastology LVIDs:         3.00 cm   LV e' medial:    4.57 cm/s LV PW:         1.10 cm   LV E/e' medial:  14.1 LV IVS:  1.10 cm   LV e' lateral:   6.09 cm/s LVOT diam:     2.00 cm   LV E/e' lateral: 10.5 LV SV:         45 LV SV Index:   23 LVOT Area:     3.14 cm  RIGHT VENTRICLE             IVC RV Basal diam:  2.90 cm     IVC diam: 0.90 cm RV S prime:     12.97 cm/s TAPSE (M-mode): 2.0 cm LEFT ATRIUM             Index        RIGHT ATRIUM           Index LA diam:        4.00 cm 2.08 cm/m   RA Area:     12.20 cm LA Vol (A2C):   44.1 ml 22.93 ml/m  RA Volume:   28.30 ml  14.71 ml/m LA Vol (A4C):   29.1 ml 15.13 ml/m LA Biplane Vol: 37.9 ml 19.71 ml/m  AORTIC VALVE AV Area (Vmax):    2.62 cm AV Area (Vmean):   2.62 cm AV Area (VTI):     3.03 cm AV Vmax:           86.57 cm/s AV Vmean:          60.433 cm/s AV VTI:            0.148 m AV Peak  Grad:      3.0 mmHg AV Mean Grad:      1.7 mmHg LVOT Vmax:         72.23 cm/s LVOT Vmean:        50.367 cm/s LVOT VTI:          0.143 m LVOT/AV VTI ratio: 0.96  AORTA Ao Root diam: 2.55 cm MITRAL VALVE MV Area (PHT): 4.64 cm    SHUNTS MV Decel Time: 164 msec    Systemic VTI:  0.14 m MV E velocity: 64.26 cm/s  Systemic Diam: 2.00 cm MV A velocity: 95.70 cm/s MV E/A ratio:  0.67 Epifanio Lesches MD Electronically signed by Epifanio Lesches MD Signature Date/Time: 03/02/2023/12:17:31 PM    Final       LOS: 2 days    Jacquelin Hawking, MD [facility] 03/04/2023, 8:52 AM   If 7PM-7AM, please contact night-coverage [email or web address]

## 2023-03-05 ENCOUNTER — Inpatient Hospital Stay (HOSPITAL_COMMUNITY): Payer: Medicare Other

## 2023-03-05 DIAGNOSIS — E039 Hypothyroidism, unspecified: Secondary | ICD-10-CM

## 2023-03-05 DIAGNOSIS — I63413 Cerebral infarction due to embolism of bilateral middle cerebral arteries: Secondary | ICD-10-CM

## 2023-03-05 DIAGNOSIS — I491 Atrial premature depolarization: Secondary | ICD-10-CM

## 2023-03-05 DIAGNOSIS — E119 Type 2 diabetes mellitus without complications: Secondary | ICD-10-CM | POA: Diagnosis not present

## 2023-03-05 DIAGNOSIS — E43 Unspecified severe protein-calorie malnutrition: Secondary | ICD-10-CM

## 2023-03-05 DIAGNOSIS — R569 Unspecified convulsions: Secondary | ICD-10-CM | POA: Diagnosis not present

## 2023-03-05 LAB — GLUCOSE, CAPILLARY
Glucose-Capillary: 113 mg/dL — ABNORMAL HIGH (ref 70–99)
Glucose-Capillary: 137 mg/dL — ABNORMAL HIGH (ref 70–99)
Glucose-Capillary: 117 mg/dL — ABNORMAL HIGH (ref 70–99)
Glucose-Capillary: 107 mg/dL — ABNORMAL HIGH (ref 70–99)

## 2023-03-05 LAB — CBC
HCT: 37.1 % — ABNORMAL LOW (ref 39.0–52.0)
Hemoglobin: 12.4 g/dL — ABNORMAL LOW (ref 13.0–17.0)
MCH: 32.3 pg (ref 26.0–34.0)
MCHC: 33.4 g/dL (ref 30.0–36.0)
MCV: 96.6 fL (ref 80.0–100.0)
Platelets: 309 10*3/uL (ref 150–400)
RBC: 3.84 MIL/uL — ABNORMAL LOW (ref 4.22–5.81)
RDW: 13.7 % (ref 11.5–15.5)
WBC: 10.8 10*3/uL — ABNORMAL HIGH (ref 4.0–10.5)
nRBC: 0 % (ref 0.0–0.2)

## 2023-03-05 LAB — LIPID PANEL
Cholesterol: 138 mg/dL (ref 0–200)
HDL: 31 mg/dL — ABNORMAL LOW (ref >40)
LDL Cholesterol: 89 mg/dL (ref 0–99)
Total CHOL/HDL Ratio: 4.5 {ratio}
Triglycerides: 88 mg/dL (ref <150)
VLDL: 18 mg/dL (ref 0–40)

## 2023-03-05 LAB — BASIC METABOLIC PANEL
Anion gap: 8 (ref 5–15)
BUN: 17 mg/dL (ref 8–23)
CO2: 27 mmol/L (ref 22–32)
Calcium: 9.2 mg/dL (ref 8.9–10.3)
Chloride: 104 mmol/L (ref 98–111)
Creatinine, Ser: 1.3 mg/dL — ABNORMAL HIGH (ref 0.61–1.24)
GFR, Estimated: 54 mL/min — ABNORMAL LOW (ref >60)
Glucose, Bld: 114 mg/dL — ABNORMAL HIGH (ref 70–99)
Potassium: 3.9 mmol/L (ref 3.5–5.1)
Sodium: 139 mmol/L (ref 135–145)

## 2023-03-05 LAB — HEMOGLOBIN A1C
Hgb A1c MFr Bld: 5.3 % (ref 4.8–5.6)
Mean Plasma Glucose: 105.41 mg/dL

## 2023-03-05 MED ORDER — IOHEXOL 350 MG/ML SOLN
75.0000 mL | Freq: Once | INTRAVENOUS | Status: AC | PRN
  Administered 2023-03-05: 75 mL via INTRAVENOUS

## 2023-03-05 MED ORDER — ROSUVASTATIN CALCIUM 5 MG PO TABS
10.0000 mg | ORAL_TABLET | Freq: Every day | ORAL | Status: DC
  Administered 2023-03-06 – 2023-03-07 (×2): 10 mg via ORAL
  Filled 2023-03-05 (×2): qty 2

## 2023-03-05 NOTE — Plan of Care (Signed)
  Problem: Education: Goal: Ability to describe self-care measures that may prevent or decrease complications (Diabetes Survival Skills Education) will improve Outcome: Progressing Goal: Individualized Educational Video(s) Outcome: Progressing   Problem: Coping: Goal: Ability to adjust to condition or change in health will improve Outcome: Progressing   Problem: Fluid Volume: Goal: Ability to maintain a balanced intake and output will improve Outcome: Progressing   Problem: Health Behavior/Discharge Planning: Goal: Ability to identify and utilize available resources and services will improve Outcome: Progressing Goal: Ability to manage health-related needs will improve Outcome: Progressing   Problem: Metabolic: Goal: Ability to maintain appropriate glucose levels will improve Outcome: Progressing   Problem: Nutritional: Goal: Maintenance of adequate nutrition will improve Outcome: Progressing Goal: Progress toward achieving an optimal weight will improve Outcome: Progressing   Problem: Skin Integrity: Goal: Risk for impaired skin integrity will decrease Outcome: Progressing   Problem: Tissue Perfusion: Goal: Adequacy of tissue perfusion will improve Outcome: Progressing   Problem: Education: Goal: Knowledge of General Education information will improve Description: Including pain rating scale, medication(s)/side effects and non-pharmacologic comfort measures Outcome: Progressing   Problem: Health Behavior/Discharge Planning: Goal: Ability to manage health-related needs will improve Outcome: Progressing   Problem: Clinical Measurements: Goal: Ability to maintain clinical measurements within normal limits will improve Outcome: Progressing Goal: Will remain free from infection Outcome: Progressing Goal: Diagnostic test results will improve Outcome: Progressing Goal: Respiratory complications will improve Outcome: Progressing Goal: Cardiovascular complication will  be avoided Outcome: Progressing   Problem: Activity: Goal: Risk for activity intolerance will decrease Outcome: Progressing   Problem: Nutrition: Goal: Adequate nutrition will be maintained Outcome: Progressing   Problem: Coping: Goal: Level of anxiety will decrease Outcome: Progressing   Problem: Elimination: Goal: Will not experience complications related to bowel motility Outcome: Progressing Goal: Will not experience complications related to urinary retention Outcome: Progressing   Problem: Pain Management: Goal: General experience of comfort will improve Outcome: Progressing   Problem: Safety: Goal: Ability to remain free from injury will improve Outcome: Progressing   Problem: Skin Integrity: Goal: Risk for impaired skin integrity will decrease Outcome: Progressing   Problem: Education: Goal: Knowledge of disease or condition will improve Outcome: Progressing Goal: Understanding of medication regimen will improve Outcome: Progressing Goal: Individualized Educational Video(s) Outcome: Progressing   Problem: Activity: Goal: Ability to tolerate increased activity will improve Outcome: Progressing   Problem: Cardiac: Goal: Ability to achieve and maintain adequate cardiopulmonary perfusion will improve Outcome: Progressing   Problem: Health Behavior/Discharge Planning: Goal: Ability to safely manage health-related needs after discharge will improve Outcome: Progressing   Problem: Education: Goal: Expressions of having a comfortable level of knowledge regarding the disease process will increase Outcome: Progressing   Problem: Coping: Goal: Ability to adjust to condition or change in health will improve Outcome: Progressing Goal: Ability to identify appropriate support needs will improve Outcome: Progressing   Problem: Health Behavior/Discharge Planning: Goal: Compliance with prescribed medication regimen will improve Outcome: Progressing   Problem:  Medication: Goal: Risk for medication side effects will decrease Outcome: Progressing   Problem: Clinical Measurements: Goal: Complications related to the disease process, condition or treatment will be avoided or minimized Outcome: Progressing Goal: Diagnostic test results will improve Outcome: Progressing   Problem: Safety: Goal: Verbalization of understanding the information provided will improve Outcome: Progressing   Problem: Self-Concept: Goal: Level of anxiety will decrease Outcome: Progressing Goal: Ability to verbalize feelings about condition will improve Outcome: Progressing

## 2023-03-05 NOTE — Progress Notes (Signed)
STROKE TEAM PROGRESS NOTE   BRIEF HPI [name] is a 87 y.o. 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  Most recent surgery was yesterday, when scalp mass was excised, bone scrapings of skull were taken for biopsy and some sort of dermal matrix was placed.  This morning, patient noticed painful shaking of his right arm which was uncontrollable and lasted about 5 or 6 minutes.  Afterwards, he was unable to move his right arm.  He had 3 more of these episodes while in the ambulance and in the emergency department.  He states he has never had a serious head injury, has no family members with seizures and has never had meningitis or encephalitis, never had febrile seizures as a child and did not have any problems with his birth or development.  He has never had seizure before today.  MRI brain with and without contrast on 10/8 demonstrated no intracranial mass lesions to explain seizure.   SIGNIFICANT HOSPITAL EVENTS 11/6: EEG shows intermittent generalized slowing 11/7: MRI shows numerous acute infarcts bilateral frontal and parietal lobes  INTERIM HISTORY/SUBJECTIVE Wife and daughter and granddaughter at the bedside.  Patient lying in bed, chronically blind.  However awake alert fully orientated.  Scalp dressing in place.  No seizure-like activity overnight.  Still on Keppra.  Per family, patient scalp dressing need to be come out next Tuesday.   OBJECTIVE  CBC    Component Value Date/Time   WBC 10.8 (H) 03/05/2023 0439   RBC 3.84 (L) 03/05/2023 0439   HGB 12.4 (L) 03/05/2023 0439   HCT 37.1 (L) 03/05/2023 0439   PLT 309 03/05/2023 0439   MCV 96.6 03/05/2023 0439   MCH 32.3 03/05/2023 0439   MCHC 33.4 03/05/2023 0439   RDW 13.7 03/05/2023 0439   LYMPHSABS 2.0 02/14/2020 1631    MONOABS 0.6 02/14/2020 1631   EOSABS 0.2 02/14/2020 1631   BASOSABS 0.1 02/14/2020 1631    BMET    Component Value Date/Time   NA 139 03/05/2023 0439   K 3.9 03/05/2023 0439   CL 104 03/05/2023 0439   CO2 27 03/05/2023 0439   GLUCOSE 114 (H) 03/05/2023 0439   BUN 17 03/05/2023 0439   CREATININE 1.30 (H) 03/05/2023 0439   CALCIUM 9.2 03/05/2023 0439   GFRNONAA 54 (L) 03/05/2023 0439    IMAGING past 24 hours No results found.  Vitals:   03/04/23 2031 03/04/23 2358 03/05/23 0358 03/05/23 0825  BP: (!) 150/66 (!) 160/72 (!) 155/81 (!) 152/114  Pulse: 72 66 75 67  Resp: 16 18 18    Temp: (!) 100.5 F (38.1 C) 98.2 F (36.8 C) 98.8 F (37.1 C) 97.7 F (36.5 C)  TempSrc: Oral Oral Oral Oral  SpO2: 96% 98% 98% 98%  Weight:      Height:         PHYSICAL EXAM General:  Alert, well-nourished, well-developed patient in no acute distress Skin: Vertex scalp dressing in place CV: Regular rate and rhythm on monitor  NEURO:  Mental Status: AA&Ox3, patient is able to give clear and coherent history Speech/Language: speech is without dysarthria or aphasia.  Naming, repetition, fluency, and comprehension intact.  Cranial Nerves:  II: Blind bilaterally chronically.  Right no light perception.  Left eye only inconsistent light perception. III, IV, VI: EOMI. Eyelids elevate symmetrically.  V: Sensation is intact to light touch and symmetrical to face.  VII: Mild left nasolabial fold flattening VIII: hearing intact to voice. IX, X: Palate elevates symmetrically. Phonation is normal.  QI:ONGEXBMW shrug 5/5. XII: tongue is midline without fasciculations. Motor: generalized weakness 4/5 throughout.   Tone: is normal and bulk is normal Sensation- Intact to light touch bilaterally.  Coordination: FTN low bilaterally but intact. Gait- deferred  ASSESSMENT/PLAN  Acute Ischemic Infarct:  mild embolic shower, etiology unclear, cryptogenic at time.   CT head - No acute intracranial  abnormality. Generalized cerebral volume loss and moderate for age white matter disease. MRI  Numerous small acute infarcts in the left greater than right frontal and parietal lobes. Innumerable chronic microhemorrhages which may reflect cerebral amyloid angiopathy. CTA head and neck pending 2D Echo: EF 50-55%, Grade I diastolic dysfunction, Mild LVH Given CAA pattern on MRI, do not fee pt is candidate for anticoagulation. Would not recommend further cardioembolic work up at this time LDL 89 HgbA1c 5.3 UDS negative VTE prophylaxis - SCDs No antithrombotic prior to admission, now on ASA 81 alone given findings of CAA on MRI.  Therapy recommendations:  CIR Disposition:  pending  Seizure-like activity Multiple episodes of right arm jerking, no other focal seizures symptoms, lasting 5 to 6 minutes each. Patient remembers all episodes, no post-ictal periods LTM EEG 11/6 1715 to 11/7 1208: This study is suggestive of mild diffuse encephalopathy. No seizures or epileptiform discharges were seen throughout the recording. Continue Keppra 500mg  BID   Scalp lesion Status post vertex SCC resection Monday 11/4 with [facility] CT head partially eroded outer table of the skull at the anterior vertex with overlying exophytic mixed density mass which might be postoperative dressing material (uncertain). No craniotomy. No skull fracture or full-thickness calvarium erosion. MRI Postoperative changes to the frontal vertex scalp with underlying mild calvarial marrow signal abnormality and mild outer table erosion on CT. Marrow findings could be postoperative or secondary to tumor. Correlate with recent surgery and pathology. Per family, scalp dressing needs to be removed next Tuesday. 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 BP control < 140 OK to continue ASA 81 but no further escalation regarding antithrombotic  therapy OK to continue low dose statin  Hypertension Home meds:  none Stable On metformin 25 twice daily BP goal normotensive  Hyperlipidemia Home meds:  none LDL 89 goal < 70 Add Crestor 10mg   No high intensity statin due to LDL near goal and CAA on MRI Continue statin at discharge  Diabetes type II, controlled Home meds:  glipizide HgbA1c: 5.8, goal < 7.0 CBGs SSI Recommend close follow-up with PCP  Other Stroke Risk Factors Advanced age Former cigarette smoker  Other Active Problems Blindness CKD IIIa, creatinine 1.56--1.49 Hypothyroidism Home synthroid continued  Hospital day # 3  I discussed with Dr. Caleb Popp. I spent considerable face-to-face time with the patient, his wife and daughter as well as granddaughter, more than 50% of which was spent in counseling and coordination of care, reviewing test results, images and medication, and discussing the diagnosis, treatment plan and potential prognosis. This patient's care requiresreview of multiple databases, neurological assessment, discussion with family, other specialists and medical decision making of high complexity. I had long discussion with patient, his daughter, wife and granddaughter at bedside, updated pt current condition, treatment plan and potential prognosis, and answered all the questions.  They expressed understanding and appreciation.     Mckynzie Liwanag  Roda Shutters, MD PhD Stroke Neurology 03/05/2023 11:05 AM    To contact Stroke Continuity provider, please refer to [email or web address]. After hours, contact General Neurology

## 2023-03-05 NOTE — Plan of Care (Signed)
Pt alert and oriented x 4. Denies any pain or discomfort. IV is patent and saline locked. Wound to head moisturized with petroleum jelly throughout shift. Skin tear to head covered with mepilex. MRI this shift. Continent of bowel and bladder. Family at bedside. Will continue with current plan of care.  Problem: Education: Goal: Ability to describe self-care measures that may prevent or decrease complications (Diabetes Survival Skills Education) will improve Outcome: Progressing Goal: Individualized Educational Video(s) Outcome: Progressing   Problem: Coping: Goal: Ability to adjust to condition or change in health will improve Outcome: Progressing   Problem: Fluid Volume: Goal: Ability to maintain a balanced intake and output will improve Outcome: Progressing   Problem: Health Behavior/Discharge Planning: Goal: Ability to identify and utilize available resources and services will improve Outcome: Progressing Goal: Ability to manage health-related needs will improve Outcome: Progressing   Problem: Metabolic: Goal: Ability to maintain appropriate glucose levels will improve Outcome: Progressing   Problem: Nutritional: Goal: Maintenance of adequate nutrition will improve Outcome: Progressing Goal: Progress toward achieving an optimal weight will improve Outcome: Progressing   Problem: Skin Integrity: Goal: Risk for impaired skin integrity will decrease Outcome: Progressing   Problem: Tissue Perfusion: Goal: Adequacy of tissue perfusion will improve Outcome: Progressing   Problem: Education: Goal: Knowledge of General Education information will improve Description: Including pain rating scale, medication(s)/side effects and non-pharmacologic comfort measures Outcome: Progressing   Problem: Health Behavior/Discharge Planning: Goal: Ability to manage health-related needs will improve Outcome: Progressing   Problem: Clinical Measurements: Goal: Ability to maintain clinical  measurements within normal limits will improve Outcome: Progressing Goal: Will remain free from infection Outcome: Progressing Goal: Diagnostic test results will improve Outcome: Progressing Goal: Respiratory complications will improve Outcome: Progressing Goal: Cardiovascular complication will be avoided Outcome: Progressing   Problem: Activity: Goal: Risk for activity intolerance will decrease Outcome: Progressing   Problem: Nutrition: Goal: Adequate nutrition will be maintained Outcome: Progressing   Problem: Coping: Goal: Level of anxiety will decrease Outcome: Progressing   Problem: Elimination: Goal: Will not experience complications related to bowel motility Outcome: Progressing Goal: Will not experience complications related to urinary retention Outcome: Progressing   Problem: Pain Management: Goal: General experience of comfort will improve Outcome: Progressing   Problem: Safety: Goal: Ability to remain free from injury will improve Outcome: Progressing   Problem: Skin Integrity: Goal: Risk for impaired skin integrity will decrease Outcome: Progressing   Problem: Education: Goal: Knowledge of disease or condition will improve Outcome: Progressing Goal: Understanding of medication regimen will improve Outcome: Progressing Goal: Individualized Educational Video(s) Outcome: Progressing   Problem: Activity: Goal: Ability to tolerate increased activity will improve Outcome: Progressing   Problem: Cardiac: Goal: Ability to achieve and maintain adequate cardiopulmonary perfusion will improve Outcome: Progressing   Problem: Health Behavior/Discharge Planning: Goal: Ability to safely manage health-related needs after discharge will improve Outcome: Progressing   Problem: Education: Goal: Expressions of having a comfortable level of knowledge regarding the disease process will increase Outcome: Progressing   Problem: Coping: Goal: Ability to adjust to  condition or change in health will improve Outcome: Progressing Goal: Ability to identify appropriate support needs will improve Outcome: Progressing   Problem: Health Behavior/Discharge Planning: Goal: Compliance with prescribed medication regimen will improve Outcome: Progressing   Problem: Medication: Goal: Risk for medication side effects will decrease Outcome: Progressing   Problem: Clinical Measurements: Goal: Complications related to the disease process, condition or treatment will be avoided or minimized Outcome: Progressing Goal: Diagnostic test results will improve  Outcome: Progressing   Problem: Safety: Goal: Verbalization of understanding the information provided will improve Outcome: Progressing   Problem: Self-Concept: Goal: Level of anxiety will decrease Outcome: Progressing Goal: Ability to verbalize feelings about condition will improve Outcome: Progressing

## 2023-03-05 NOTE — Progress Notes (Signed)
PROGRESS NOTE    Douglas Melton  UYQ:[id number] DOB: November 13, 1935 DOA: 03/01/2023 PCP: Barbie Banner, MD   Brief Narrative: Douglas Melton is a 87 y.o. 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  Patient presented secondary to seizure-like activity. Neurology consulted. Keppra started and EEG ordered, which did not capture evidence of seizure activity. MRI brain obtained showing multiple acute strokes, concerning for embolic etiology.   Assessment and Plan:  Seizure-like activity Patient with right arm movements concerning for possible focal seizures. Neurology consulted and are not convinced this is related to recent craniectomy. Long-term EEG ordered and patient loaded on Keppra. EEG without evidence of seizure or epileptiform discharges. MRI obtained and are significant for multiple strokes. -Ongoing neurology recommendations: Continue Keppra  Acute ischemic CVA Multiple infarcts suggesting embolic pattern. MRI brain confirms numerous small acute infarcts in left greater than right frontal and parietal lobes with moderate/severe chronic small vessel ischemic disease and chronic microhemorrhages which may reflect cerebral amyloid angiopathy. In setting of newly diagnosed atrial fibrillation. LDL pending. Hemoglobin A1C pending. Transthoracic Echocardiogram significant for low-normal LVEF of 50-55% with grade 1 diastolic dysfunction; interatrial septum not well visualized. Neurology recommendations pending. PT/OT recommendations for acute inpatient rehabilitation. -Follow-up neurology recommendations: Aspirin, Plavix x3 weeks, 30-day heart monitor, outpatient follow-up  Sinus tachycardia with PACs Initial concern for possible atrial fibrillation. Rhythm on telemetry did not appear consistent with atrial fibrillation. EKGs  obtained not suggestive of atrial fibrillation. Discussed with cardiology service who agree there is no evidence of atrial fibrillation. 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  CKD stage IIIa Stable.  Diabetes mellitus type 2 Controlled with hemoglobin A1C of 5.8%. Glipizide held. -Continue SSI  Hypothyroidism -Continue Synthroid  Blindness Noted.  Failure to thrive Severe malnutrition Underweight Noted. Dietitian consulted. Estimated body mass index is 17.12 kg/m as calculated from the following:   Height as of this encounter: 6\' 2"  (1.88 m).   Weight as of this encounter: 60.5 kg.   DVT prophylaxis: SCDs Code Status:   Code Status: Full Code Family Communication: None at bedside Disposition Plan: Discharge to acute inpatient rehabilitation pending bed availability. Medically stable for discharge.   Consultants:  Neurology  Procedures:  EEG Transthoracic Echocardiogram  Antimicrobials: None    Subjective: Patient reports no concerns this morning.  Objective: BP (!) 152/114 (BP Location: Right Arm)   Pulse 67   Temp 97.7 F (36.5 C) (Oral)   Resp 18   Ht 6\' 2"  (1.88 m)   Wt 60.5 kg   SpO2 98%   BMI 17.12 kg/m   Examination:  General exam: Appears calm and comfortable Respiratory system: Clear to auscultation. Respiratory effort normal. Cardiovascular system: S1 & S2 heard, RRR. Gastrointestinal system: Abdomen is nondistended, soft and nontender. Normal bowel sounds heard. Central nervous system: Alert and oriented. No focal neurological deficits. Musculoskeletal: No edema. No calf tenderness Psychiatry: Judgement and insight appear normal. Mood & affect appropriate.    Data Reviewed: I have personally reviewed following labs and imaging studies  CBC Lab Results  Component Value Date    WBC 10.8 (H) 03/05/2023   RBC 3.84 (L) 03/05/2023   HGB 12.4 (L) 03/05/2023   HCT 37.1 (L) 03/05/2023   MCV 96.6 03/05/2023  MCH 32.3 03/05/2023   PLT 309 03/05/2023   MCHC 33.4 03/05/2023   RDW 13.7 03/05/2023   LYMPHSABS 2.0 02/14/2020   MONOABS 0.6 02/14/2020   EOSABS 0.2 02/14/2020   BASOSABS 0.1 02/14/2020     Last metabolic panel Lab Results  Component Value Date   NA 139 03/05/2023   K 3.9 03/05/2023   CL 104 03/05/2023   CO2 27 03/05/2023   BUN 17 03/05/2023   CREATININE 1.30 (H) 03/05/2023   GLUCOSE 114 (H) 03/05/2023   GFRNONAA 54 (L) 03/05/2023   GFRAA 57 (L) 11/03/2018   CALCIUM 9.2 03/05/2023   PHOS 2.9 02/20/2020   PROT 6.2 (L) 03/01/2023   ALBUMIN 3.1 (L) 03/01/2023   BILITOT 0.9 03/01/2023   ALKPHOS 83 03/01/2023   AST 17 03/01/2023   ALT 9 03/01/2023   ANIONGAP 8 03/05/2023    GFR: Estimated Creatinine Clearance: 35.6 mL/min (A) (by C-G formula based on SCr of 1.3 mg/dL (H)).  No results found for this or any previous visit (from the past 240 hour(s)).    Radiology Studies: MR BRAIN W WO CONTRAST  Addendum Date: 03/03/2023   ADDENDUM REPORT: 03/03/2023 19:15 ADDENDUM: These results will be called to the ordering clinician or representative by the Radiologist Assistant, and communication documented in the PACS or [employer]. Electronically Signed   By: Sebastian Ache M.D.   On: 03/03/2023 19:15   Result Date: 03/03/2023 CLINICAL DATA:  New onset seizures. Recent resection of squamous cell carcinoma of the scalp. EXAM: MRI HEAD WITHOUT AND WITH CONTRAST TECHNIQUE: Multiplanar, multiecho pulse sequences of the brain and surrounding structures were obtained without and with intravenous contrast. CONTRAST:  6mL GADAVIST GADOBUTROL 1 MMOL/ML IV SOLN COMPARISON:  Head CT 03/01/2023 FINDINGS: Brain: Numerous small foci of predominantly cortically based restricted diffusion are present in the left greater than right frontal and parietal lobes, greatest  towards the vertex. There is no abnormal brain parenchymal or meningeal enhancement, and these are most suggestive of acute infarcts. Patchy and confluent T2 hyperintensities in the subcortical and deep cerebral white matter bilaterally are nonspecific but compatible with moderate to severe chronic small vessel ischemic disease. There is mild-to-moderate cerebral atrophy. Innumerable chronic microhemorrhages are present peripherally throughout both cerebral hemispheres (greatest posteriorly, particularly in the right occipitotemporal region) and in the cerebellum. The hippocampi are symmetric in size without a focal signal abnormality identified. There is no midline shift or extra-axial fluid collection. Vascular: Major intracranial vascular flow voids are preserved. Skull and upper cervical spine: Postoperative changes to the frontal vertex scalp with underlying mild mild patchy T1 hypointensity and enhancement of the underlying frontal skull and with mild outer table erosion shown on CT. Presumed overlying dressing. Sinuses/Orbits: Bilateral cataract extraction. Trace right mastoid fluid. Minimal mucosal thickening in the ethmoid sinuses. Other: None. IMPRESSION: 1. Numerous small acute infarcts in the left greater than right frontal and parietal lobes. 2. Moderate to severe chronic small vessel ischemic disease. 3. Innumerable chronic microhemorrhages which may reflect cerebral amyloid angiopathy. 4. Postoperative changes to the frontal vertex scalp with underlying mild calvarial marrow signal abnormality and mild outer table erosion on CT. Marrow findings could be postoperative or secondary to tumor. Correlate with recent surgery and pathology. Electronically Signed: By: Sebastian Ache M.D. On: 03/03/2023 19:13      LOS: 3 days    Jacquelin Hawking, MD [facility] 03/05/2023, 10:06 AM   If 7PM-7AM, please contact night-coverage [email or web address]

## 2023-03-06 DIAGNOSIS — E119 Type 2 diabetes mellitus without complications: Secondary | ICD-10-CM | POA: Diagnosis not present

## 2023-03-06 DIAGNOSIS — I639 Cerebral infarction, unspecified: Secondary | ICD-10-CM | POA: Insufficient documentation

## 2023-03-06 DIAGNOSIS — R569 Unspecified convulsions: Secondary | ICD-10-CM | POA: Diagnosis not present

## 2023-03-06 DIAGNOSIS — E039 Hypothyroidism, unspecified: Secondary | ICD-10-CM | POA: Diagnosis not present

## 2023-03-06 DIAGNOSIS — I491 Atrial premature depolarization: Secondary | ICD-10-CM | POA: Diagnosis not present

## 2023-03-06 LAB — GLUCOSE, CAPILLARY
Glucose-Capillary: 105 mg/dL — ABNORMAL HIGH (ref 70–99)
Glucose-Capillary: 101 mg/dL — ABNORMAL HIGH (ref 70–99)
Glucose-Capillary: 129 mg/dL — ABNORMAL HIGH (ref 70–99)
Glucose-Capillary: 111 mg/dL — ABNORMAL HIGH (ref 70–99)

## 2023-03-06 NOTE — Progress Notes (Signed)
PROGRESS NOTE    Douglas Melton  [id number]:[id number] DOB: 1936-03-24 DOA: 03/01/2023 PCP: Barbie Banner, MD   Brief Narrative: Douglas Melton is a 87 y.o. 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  Patient presented secondary to seizure-like activity. Neurology consulted. Keppra started and EEG ordered, which did not capture evidence of seizure activity. MRI brain obtained showing multiple acute strokes, concerning for embolic etiology.   Assessment and Plan:  Seizure-like activity Patient with right arm movements concerning for possible focal seizures. Neurology consulted and are not convinced this is related to recent craniectomy. Long-term EEG ordered and patient loaded on Keppra. EEG without evidence of seizure or epileptiform discharges. MRI obtained and are significant for multiple strokes. Neurology recommendations to continue Keppra. -Continue Keppra  Acute ischemic CVA Multiple infarcts suggesting embolic pattern. MRI brain confirms numerous small acute infarcts in left greater than right frontal and parietal lobes with moderate/severe chronic small vessel ischemic disease and chronic microhemorrhages which may reflect cerebral amyloid angiopathy. In setting of newly diagnosed atrial fibrillation. LDL pending. Hemoglobin A1C pending. Transthoracic Echocardiogram significant for low-normal LVEF of 50-55% with grade 1 diastolic dysfunction; interatrial septum not well visualized. Neurology recommendations for aspirin monotherapy; cardioembolic workup deferred secondary to cerebral amyloid angiopathy. PT/OT recommendations for acute inpatient rehabilitation. Patient to follow-up outpatient with neurology. -Continue Crestor and aspirin  Sinus tachycardia with PACs Initial concern for possible atrial fibrillation. Rhythm on  telemetry did not appear consistent with atrial fibrillation. EKGs obtained not suggestive of atrial fibrillation. Discussed with cardiology service who agree there is no evidence of atrial fibrillation. 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  CKD stage IIIa Stable.  Diabetes mellitus type 2 Controlled with hemoglobin A1C of 5.8%. Glipizide held. -Continue SSI  Hypothyroidism -Continue Synthroid  Blindness Noted.  Failure to thrive Severe malnutrition Underweight Noted. Dietitian consulted. Estimated body mass index is 17.12 kg/m as calculated from the following:   Height as of this encounter: 6\' 2"  (1.88 m).   Weight as of this encounter: 60.5 kg.   DVT prophylaxis: SCDs Code Status:   Code Status: Full Code Family Communication: Wife at bedside Disposition Plan: Discharge to acute inpatient rehabilitation pending bed availability. Medically stable for discharge.   Consultants:  Neurology  Procedures:  EEG Transthoracic Echocardiogram  Antimicrobials: None    Subjective: Some bleeding from scalp wound posterior to surgical wound. No other concerns.  Objective: BP (!) (P) 140/68 (BP Location: Left Arm)   Pulse (P) 68   Temp (P) 97.8 F (36.6 C) (Oral)   Resp (P) 18   Ht 6\' 2"  (1.88 m)   Wt 60.5 kg   SpO2 (P) 94%   BMI 17.12 kg/m   Examination:  General exam: Appears calm and comfortable Respiratory system: Clear to auscultation. Respiratory effort normal. Cardiovascular system: S1 & S2 heard, RRR. Gastrointestinal system: Abdomen is nondistended, soft and nontender. Normal bowel sounds heard. Central nervous system: Alert and oriented. No focal neurological deficits.   Data Reviewed: I have personally reviewed following labs and imaging studies  CBC Lab Results  Component Value Date   WBC  10.8 (H) 03/05/2023   RBC 3.84 (L) 03/05/2023   HGB 12.4 (L) 03/05/2023   HCT 37.1 (L) 03/05/2023   MCV  96.6 03/05/2023   MCH 32.3 03/05/2023   PLT 309 03/05/2023   MCHC 33.4 03/05/2023   RDW 13.7 03/05/2023   LYMPHSABS 2.0 02/14/2020   MONOABS 0.6 02/14/2020   EOSABS 0.2 02/14/2020   BASOSABS 0.1 02/14/2020     Last metabolic panel Lab Results  Component Value Date   NA 139 03/05/2023   K 3.9 03/05/2023   CL 104 03/05/2023   CO2 27 03/05/2023   BUN 17 03/05/2023   CREATININE 1.30 (H) 03/05/2023   GLUCOSE 114 (H) 03/05/2023   GFRNONAA 54 (L) 03/05/2023   GFRAA 57 (L) 11/03/2018   CALCIUM 9.2 03/05/2023   PHOS 2.9 02/20/2020   PROT 6.2 (L) 03/01/2023   ALBUMIN 3.1 (L) 03/01/2023   BILITOT 0.9 03/01/2023   ALKPHOS 83 03/01/2023   AST 17 03/01/2023   ALT 9 03/01/2023   ANIONGAP 8 03/05/2023    GFR: Estimated Creatinine Clearance: 35.6 mL/min (A) (by C-G formula based on SCr of 1.3 mg/dL (H)).  No results found for this or any previous visit (from the past 240 hour(s)).    Radiology Studies: CT ANGIO HEAD NECK W WO CM  Result Date: 03/05/2023 CLINICAL DATA:  87 year old male with seizures. Scattered bilateral small cerebral infarcts on MRI two days ago. Recently resected skin cancer of the scalp. EXAM: CT ANGIOGRAPHY HEAD AND NECK WITH AND WITHOUT CONTRAST TECHNIQUE: Multidetector CT imaging of the head and neck was performed using the standard protocol during bolus administration of intravenous contrast. Multiplanar CT image reconstructions and MIPs were obtained to evaluate the vascular anatomy. Carotid stenosis measurements (when applicable) are obtained utilizing NASCET criteria, using the distal internal carotid diameter as the denominator. RADIATION DOSE REDUCTION: This exam was performed according to the departmental dose-optimization program which includes automated exposure control, adjustment of the mA and/or kV according to patient size and/or use of iterative  reconstruction technique. CONTRAST:  75mL OMNIPAQUE IOHEXOL 350 MG/ML SOLN COMPARISON:  MRI 03/03/2023.  Head and cervical spine CT 03/01/2023. FINDINGS: CT HEAD Brain: No acute intracranial hemorrhage identified. No midline shift, mass effect, or evidence of intracranial mass lesion. Scattered small cerebral hemisphere cortical and subcortical white matter infarcts on MRI yesterday remain largely occult by CT. Subcortical T2 and FLAIR hyperintensity in the left perirolandic area was partially affected, series 4, image 23 now. No new cortically based infarct identified. Calvarium and skull base: Stable abnormal calvarium at the vertex. Paranasal sinuses: Visualized paranasal sinuses and mastoids are stable and well aerated. Orbits: Stable abnormal scalp vertex. CTA NECK Skeleton: Carious dentition. Unchanged abnormal skull vertex as stated above. No new osseous abnormality identified. Upper chest: Mild apical lung scarring. Negative visible mediastinum. Other neck: Negative. Aortic arch: Tortuous aortic arch with calcified atherosclerosis. Three vessel arch configuration. Right carotid system: Mild brachiocephalic artery and right CCA origin tortuosity with no significant plaque or stenosis. Mild to moderate soft and calcified plaque at the right ICA origin and bulb, and additional plaque distal to the bulb on series 13, image 144 with subsequent 60-65 % stenosis with respect to the distal vessel. The right ICA remains patent to the skull base. Left carotid system: Mild to moderate calcified plaque at the left ICA origin. Mild combined soft and calcified plaque at the distal left ICA bulb. No significant stenosis. Vertebral arteries: Proximal right subclavian artery calcified plaque without stenosis. Normal right vertebral artery origin. Right V1 segment plaque without stenosis. Mildly dominant right vertebral artery appears patent to the skull base with no significant stenosis.  Proximal left subclavian artery soft  and calcified plaque without stenosis. Left vertebral artery origin calcified plaque with moderate to severe stenosis (series 14, image 74). The vessel remains patent, is non dominant and patent to the skull base without additional stenosis. CTA HEAD Posterior circulation: Distal vertebral arteries and vertebrobasilar junction are patent with no significant stenosis. Dominant right V4 segment. Both PICA origins are patent. Patent basilar artery without stenosis. Patent SCA and PCA origins. Posterior communicating arteries are diminutive or absent. Bilateral PCA branches are patent. There is moderate right P3 segment irregularity and stenosis on series 18, image 22. Anterior circulation: Both ICA siphons are patent. On the left supraclinoid calcified plaque results in mild to moderate stenosis. On the right similar anterior genu and supraclinoid calcified plaque, up to moderate distal right ICA stenosis. Carotid termini, MCA and ACA origins are normal. Anterior communicating artery is normal. Short segment moderate to severe stenosis of the left ACA A2 versus a 3 best seen on series 17, image 19 with distal enhancement maintained. Left MCA M1 segment bifurcates early without stenosis. Right MCA M1 segment bifurcates without stenosis. Bilateral MCA branches are within normal limits. Venous sinuses: Superior sagittal sinus remains patent. Other major venous sinuses appear patent. Anatomic variants: Dominant right vertebral artery. Review of the MIP images confirms the above findings IMPRESSION: 1. Negative for large vessel occlusion. 2. Positive for intracranial atherosclerosis with significant stenoses: - up to moderate stenosis of both supraclinoid ICAs. - moderate stenosis right ACA A2/A3. - moderate stenosis right PCA P3. 3. Significant extracranial atherosclerosis and stenosis: - Severe stenosis left vertebral artery origin. - 60-65% stenosis right ICA distal to the bulb.-moderate to severe stenosis of the Left  Vertebral Artery origin. 4. No acute intracranial hemorrhage or mass effect. Scattered small cerebral hemisphere infarcts on MRI yesterday remain largely occult by CT. 5. Stable abnormal skull vertex and scalp. 6.  Aortic Atherosclerosis (ICD10-I70.0). Electronically Signed   By: [name] M.D.   On: 03/05/2023 12:35      LOS: 4 days    Jacquelin Hawking, MD [facility] 03/06/2023, 9:37 AM   If 7PM-7AM, please contact night-coverage [email or web address]

## 2023-03-06 NOTE — Progress Notes (Addendum)
STROKE TEAM PROGRESS NOTE   BRIEF HPI [name] is a 87 y.o. 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 Most recent surgery was 11/4 when scalp mass was excised, bone scrapings of skull were taken for biopsy and some sort of dermal matrix was placed. 11/5, patient noticed painful shaking of his right arm which was uncontrollable and lasted about 5 or 6 minutes. Afterwards, he was unable to move his right arm. He had 3 more of these episodes en route and in ED.  He states he has never had a serious head injury, has no family members with seizures and has never had meningitis or encephalitis, never had febrile seizures as a child and did not have any problems with his birth or development. No history of seizures.  MRI brain with and without contrast on 10/8 demonstrated no intracranial mass lesions to explain seizure.   SIGNIFICANT HOSPITAL EVENTS 11/6: EEG shows intermittent generalized slowing 11/7: MRI shows numerous acute infarcts bilateral frontal and parietal lobes  INTERIM HISTORY/SUBJECTIVE Family at bedside.  Patient sitting up in bed, chronically blind.   On exam, awake ,alert,orientated. No new neurological events or complaints.   Scalp dressing in place (due to come off next Tuesday).  Some bleeding noted this am to posterior head abrasion unrelated to surgery, no current bleeding.  No seizure-like activity, Keppra continued due to continued risks for seizures.   Pending insurance authorization for CIR placement.   OBJECTIVE  CBC    Component Value Date/Time   WBC 10.8 (H) 03/05/2023 0439   RBC 3.84 (L) 03/05/2023 0439   HGB 12.4 (L) 03/05/2023 0439   HCT 37.1 (L) 03/05/2023 0439   PLT 309 03/05/2023 0439   MCV 96.6 03/05/2023 0439   MCH 32.3 03/05/2023 0439   MCHC  33.4 03/05/2023 0439   RDW 13.7 03/05/2023 0439   LYMPHSABS 2.0 02/14/2020 1631   MONOABS 0.6 02/14/2020 1631   EOSABS 0.2 02/14/2020 1631   BASOSABS 0.1 02/14/2020 1631    BMET    Component Value Date/Time   NA 139 03/05/2023 0439   K 3.9 03/05/2023 0439   CL 104 03/05/2023 0439   CO2 27 03/05/2023 0439   GLUCOSE 114 (H) 03/05/2023 0439   BUN 17 03/05/2023 0439   CREATININE 1.30 (H) 03/05/2023 0439   CALCIUM 9.2 03/05/2023 0439   GFRNONAA 54 (L) 03/05/2023 0439    IMAGING past 24 hours CT ANGIO HEAD NECK W WO CM  Result Date: 03/05/2023 CLINICAL DATA:  87 year old male with seizures. Scattered bilateral small cerebral infarcts on MRI two days ago. Recently resected skin cancer of the scalp. EXAM: CT ANGIOGRAPHY HEAD AND NECK WITH AND WITHOUT CONTRAST TECHNIQUE: Multidetector CT imaging of the head and neck was performed using the standard protocol during bolus administration of intravenous contrast. Multiplanar CT image reconstructions and MIPs were obtained to evaluate the vascular anatomy. Carotid stenosis measurements (when applicable) are obtained utilizing NASCET criteria, using the distal internal carotid diameter as the denominator. RADIATION DOSE REDUCTION: This exam was performed according to the departmental dose-optimization program which includes automated exposure control, adjustment of the mA and/or kV according to patient size and/or use of iterative reconstruction technique. CONTRAST:  75mL OMNIPAQUE IOHEXOL 350 MG/ML SOLN COMPARISON:  MRI 03/03/2023.  Head and cervical spine CT 03/01/2023. FINDINGS: CT HEAD Brain:  No acute intracranial hemorrhage identified. No midline shift, mass effect, or evidence of intracranial mass lesion. Scattered small cerebral hemisphere cortical and subcortical white matter infarcts on MRI yesterday remain largely occult by CT. Subcortical T2 and FLAIR hyperintensity in the left perirolandic area was partially affected, series 4, image 23 now. No  new cortically based infarct identified. Calvarium and skull base: Stable abnormal calvarium at the vertex. Paranasal sinuses: Visualized paranasal sinuses and mastoids are stable and well aerated. Orbits: Stable abnormal scalp vertex. CTA NECK Skeleton: Carious dentition. Unchanged abnormal skull vertex as stated above. No new osseous abnormality identified. Upper chest: Mild apical lung scarring. Negative visible mediastinum. Other neck: Negative. Aortic arch: Tortuous aortic arch with calcified atherosclerosis. Three vessel arch configuration. Right carotid system: Mild brachiocephalic artery and right CCA origin tortuosity with no significant plaque or stenosis. Mild to moderate soft and calcified plaque at the right ICA origin and bulb, and additional plaque distal to the bulb on series 13, image 144 with subsequent 60-65 % stenosis with respect to the distal vessel. The right ICA remains patent to the skull base. Left carotid system: Mild to moderate calcified plaque at the left ICA origin. Mild combined soft and calcified plaque at the distal left ICA bulb. No significant stenosis. Vertebral arteries: Proximal right subclavian artery calcified plaque without stenosis. Normal right vertebral artery origin. Right V1 segment plaque without stenosis. Mildly dominant right vertebral artery appears patent to the skull base with no significant stenosis. Proximal left subclavian artery soft and calcified plaque without stenosis. Left vertebral artery origin calcified plaque with moderate to severe stenosis (series 14, image 74). The vessel remains patent, is non dominant and patent to the skull base without additional stenosis. CTA HEAD Posterior circulation: Distal vertebral arteries and vertebrobasilar junction are patent with no significant stenosis. Dominant right V4 segment. Both PICA origins are patent. Patent basilar artery without stenosis. Patent SCA and PCA origins. Posterior communicating arteries are  diminutive or absent. Bilateral PCA branches are patent. There is moderate right P3 segment irregularity and stenosis on series 18, image 22. Anterior circulation: Both ICA siphons are patent. On the left supraclinoid calcified plaque results in mild to moderate stenosis. On the right similar anterior genu and supraclinoid calcified plaque, up to moderate distal right ICA stenosis. Carotid termini, MCA and ACA origins are normal. Anterior communicating artery is normal. Short segment moderate to severe stenosis of the left ACA A2 versus a 3 best seen on series 17, image 19 with distal enhancement maintained. Left MCA M1 segment bifurcates early without stenosis. Right MCA M1 segment bifurcates without stenosis. Bilateral MCA branches are within normal limits. Venous sinuses: Superior sagittal sinus remains patent. Other major venous sinuses appear patent. Anatomic variants: Dominant right vertebral artery. Review of the MIP images confirms the above findings IMPRESSION: 1. Negative for large vessel occlusion. 2. Positive for intracranial atherosclerosis with significant stenoses: - up to moderate stenosis of both supraclinoid ICAs. - moderate stenosis right ACA A2/A3. - moderate stenosis right PCA P3. 3. Significant extracranial atherosclerosis and stenosis: - Severe stenosis left vertebral artery origin. - 60-65% stenosis right ICA distal to the bulb.-moderate to severe stenosis of the Left Vertebral Artery origin. 4. No acute intracranial hemorrhage or mass effect. Scattered small cerebral hemisphere infarcts on MRI yesterday remain largely occult by CT. 5. Stable abnormal skull vertex and scalp. 6.  Aortic Atherosclerosis (ICD10-I70.0). Electronically Signed   By: [name] M.D.   On: 03/05/2023 12:35    Vitals:  03/05/23 2027 03/06/23 0019 03/06/23 0407 03/06/23 0823  BP: (!) 158/89 (!) 153/71 (!) 141/83 (!) (P) 140/68  Pulse: (!) 111 85 75 (P) 68  Resp: 18 18 18  (P) 18  Temp: 98.3 F (36.8 C) 98.8 F  (37.1 C) 97.7 F (36.5 C) (P) 97.8 F (36.6 C)  TempSrc: Oral Oral Oral (P) Oral  SpO2: 98% 98% 96% (P) 94%  Weight:      Height:         PHYSICAL EXAM General:  Alert, well-nourished, well-developed patient in no acute distress Skin: Vertex scalp dressing in place. Small abrasion to back of head.  CV: Regular rate and rhythm on monitor  NEURO:  Mental Status: AA&Ox3, patient is able to give clear and coherent history Speech/Language: speech is without dysarthria or aphasia.  Naming, repetition, fluency, and comprehension intact.  Cranial Nerves:  II: Blind bilaterally chronically.  Right no light perception.  Left eye only inconsistent light perception. III, IV, VI: EOMI. Eyelids elevate symmetrically.  V: Sensation is intact to light touch and symmetrical to face.  VII: Slight left nasolabial fold flattening VIII: hearing intact to voice. IX, X: Palate elevates symmetrically. Phonation is normal.  ZO:[id number] shrug 5/5. XII: tongue is midline without fasciculations. Motor: generalized weakness 4/5 throughout.  Tone: is normal and bulk is normal Sensation- Intact to light touch bilaterally.  Coordination: FTN low bilaterally but intact. Gait- deferred  ASSESSMENT/PLAN  Acute Ischemic Infarct:  mild embolic shower, etiology unclear, cryptogenic at time.   CT head: No acute intracranial abnormality. Generalized cerebral volume loss and moderate for age white matter disease. MRI: Numerous small acute infarcts in the left greater than right frontal and parietal lobes. Innumerable chronic microhemorrhages which may reflect cerebral amyloid angiopathy. Repeat CT Head 11/9: No acute intracranial hemorrhage or mass effect. Scattered small cerebral hemisphere infarcts remain largely occult by CT. CTA head and neck Moderate stenosis both ICAs, right ACA, right PCA. Severe stenosis Left vertebral. 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 Would not recommend further cardioembolic work up at this time LDL 89 HgbA1c 5.3 UDS negative VTE prophylaxis - SCDs No antithrombotic prior to admission, now on ASA 81 alone given findings of CAA on MRI, continue on discharge.  Therapy recommendations:  CIR Disposition:  pending  Seizure-like activity Multiple episodes of right arm jerking, no other focal seizures symptoms, lasting 5 to 6 minutes each. Patient remembers all episodes, no post-ictal periods LTM EEG 11/6 1715 to 11/7 1208: This study is suggestive of mild diffuse encephalopathy. No seizures or epileptiform discharges were seen throughout the recording. Continue Keppra 500mg  BID   Scalp lesion Status post vertex SCC resection Monday 11/4 with [facility] CT head partially eroded outer table of the skull at the anterior vertex with overlying exophytic mixed density mass which might be postoperative dressing material (uncertain). No craniotomy. No skull fracture or full-thickness calvarium erosion. MRI Postoperative changes to the frontal vertex scalp with underlying mild calvarial marrow signal abnormality and mild outer table erosion on CT. Marrow findings could be postoperative or secondary to tumor. Correlate with recent surgery and pathology. Per family, scalp dressing needs to be removed next Tuesday. 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 BP control < 140 OK to continue ASA 81 but no further escalation regarding antithrombotic therapy OK to continue low  dose statin  Sinus Tachycardia with PACs Initial concern for Afib, but EKG not consistent with this Cardiology reviewed, agreed no Afib Home metoprolol continued  Hypertension Home meds:  none Stable On metoprolol 25mg  BID BP goal normotensive  Hyperlipidemia Home meds:  none LDL 89 goal <  70 Add Crestor 10mg   No high intensity statin due to LDL near goal and CAA on MRI Continue statin at discharge  Diabetes type II, controlled Home meds:  glipizide HgbA1c: 5.8, goal < 7.0 CBGs SSI Recommend close follow-up with PCP  Other Stroke Risk Factors Advanced age Former cigarette smoker  Other Active Problems Blindness CKD IIIa, creatinine 1.56--1.49 Hypothyroidism Home synthroid continued  Hospital day # 4  Pt seen by Neuro NP/APP and later by MD. Note/plan to be edited by MD as needed.    Lynnae January, DNP, AGACNP-BC [facility] Please use AMION for contact information & EPIC for messaging.  ATTENDING NOTE: I reviewed above note and agree with the assessment and plan. Pt was seen and examined.   Wife at bedside.  Patient lying in bed, no acute event overnight.  Neuro unchanged.  CT head and neck showed multifocal intracranial stenosis, no LVO.  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  Will not escalate antithrombotic therapy, and not candidate for further cardioembolic workup.  Continue low-dose statin.  PT and OT recommend CIR.  For detailed assessment and plan, please refer to above/below as I have made changes wherever appropriate.   Neurology will sign off. Please call with questions. Pt will follow up with stroke clinic NP at [facility] in about 4 weeks. Thanks for the consult.   Marvel Plan, MD PhD Stroke Neurology 03/06/2023 1:15 PM     To contact Stroke Continuity provider, please refer to [email or web address]. After hours, contact General Neurology

## 2023-03-06 NOTE — Plan of Care (Signed)
  Problem: Education: Goal: Ability to describe self-care measures that may prevent or decrease complications (Diabetes Survival Skills Education) will improve Outcome: Progressing Goal: Individualized Educational Video(s) Outcome: Progressing   

## 2023-03-06 NOTE — Plan of Care (Signed)
  Problem: Education: Goal: Ability to describe self-care measures that may prevent or decrease complications (Diabetes Survival Skills Education) will improve Outcome: Progressing Goal: Individualized Educational Video(s) Outcome: Progressing   Problem: Coping: Goal: Ability to adjust to condition or change in health will improve Outcome: Progressing   Problem: Fluid Volume: Goal: Ability to maintain a balanced intake and output will improve Outcome: Progressing   Problem: Health Behavior/Discharge Planning: Goal: Ability to identify and utilize available resources and services will improve Outcome: Progressing Goal: Ability to manage health-related needs will improve Outcome: Progressing   Problem: Metabolic: Goal: Ability to maintain appropriate glucose levels will improve Outcome: Progressing   Problem: Nutritional: Goal: Maintenance of adequate nutrition will improve Outcome: Progressing Goal: Progress toward achieving an optimal weight will improve Outcome: Progressing   Problem: Skin Integrity: Goal: Risk for impaired skin integrity will decrease Outcome: Progressing   Problem: Tissue Perfusion: Goal: Adequacy of tissue perfusion will improve Outcome: Progressing   Problem: Education: Goal: Knowledge of General Education information will improve Description: Including pain rating scale, medication(s)/side effects and non-pharmacologic comfort measures Outcome: Progressing   Problem: Health Behavior/Discharge Planning: Goal: Ability to manage health-related needs will improve Outcome: Progressing   Problem: Clinical Measurements: Goal: Ability to maintain clinical measurements within normal limits will improve Outcome: Progressing Goal: Will remain free from infection Outcome: Progressing Goal: Diagnostic test results will improve Outcome: Progressing Goal: Respiratory complications will improve Outcome: Progressing Goal: Cardiovascular complication will  be avoided Outcome: Progressing   Problem: Activity: Goal: Risk for activity intolerance will decrease Outcome: Progressing   Problem: Nutrition: Goal: Adequate nutrition will be maintained Outcome: Progressing   Problem: Coping: Goal: Level of anxiety will decrease Outcome: Progressing   Problem: Elimination: Goal: Will not experience complications related to bowel motility Outcome: Progressing Goal: Will not experience complications related to urinary retention Outcome: Progressing   Problem: Pain Management: Goal: General experience of comfort will improve Outcome: Progressing   Problem: Safety: Goal: Ability to remain free from injury will improve Outcome: Progressing   Problem: Skin Integrity: Goal: Risk for impaired skin integrity will decrease Outcome: Progressing   Problem: Education: Goal: Knowledge of disease or condition will improve Outcome: Progressing Goal: Understanding of medication regimen will improve Outcome: Progressing Goal: Individualized Educational Video(s) Outcome: Progressing   Problem: Activity: Goal: Ability to tolerate increased activity will improve Outcome: Progressing   Problem: Cardiac: Goal: Ability to achieve and maintain adequate cardiopulmonary perfusion will improve Outcome: Progressing   Problem: Health Behavior/Discharge Planning: Goal: Ability to safely manage health-related needs after discharge will improve Outcome: Progressing   Problem: Education: Goal: Expressions of having a comfortable level of knowledge regarding the disease process will increase Outcome: Progressing   Problem: Coping: Goal: Ability to adjust to condition or change in health will improve Outcome: Progressing Goal: Ability to identify appropriate support needs will improve Outcome: Progressing   Problem: Health Behavior/Discharge Planning: Goal: Compliance with prescribed medication regimen will improve Outcome: Progressing   Problem:  Medication: Goal: Risk for medication side effects will decrease Outcome: Progressing   Problem: Clinical Measurements: Goal: Complications related to the disease process, condition or treatment will be avoided or minimized Outcome: Progressing Goal: Diagnostic test results will improve Outcome: Progressing   Problem: Safety: Goal: Verbalization of understanding the information provided will improve Outcome: Progressing   Problem: Self-Concept: Goal: Level of anxiety will decrease Outcome: Progressing Goal: Ability to verbalize feelings about condition will improve Outcome: Progressing

## 2023-03-07 ENCOUNTER — Inpatient Hospital Stay (HOSPITAL_COMMUNITY)
Admission: AD | Admit: 2023-03-07 | Discharge: 2023-03-16 | DRG: 057 | Disposition: A | Discharge status: Home Health | Payer: Medicare Other | Source: Intra-hospital | Attending: Physical Medicine & Rehabilitation | Admitting: Physical Medicine & Rehabilitation

## 2023-03-07 ENCOUNTER — Encounter (HOSPITAL_COMMUNITY): Payer: Self-pay | Admitting: Physical Medicine & Rehabilitation

## 2023-03-07 ENCOUNTER — Other Ambulatory Visit: Payer: Self-pay

## 2023-03-07 DIAGNOSIS — I680 Cerebral amyloid angiopathy: Secondary | ICD-10-CM | POA: Diagnosis present

## 2023-03-07 DIAGNOSIS — I69398 Other sequelae of cerebral infarction: Secondary | ICD-10-CM | POA: Diagnosis not present

## 2023-03-07 DIAGNOSIS — E785 Hyperlipidemia, unspecified: Secondary | ICD-10-CM | POA: Diagnosis present

## 2023-03-07 DIAGNOSIS — Z794 Long term (current) use of insulin: Secondary | ICD-10-CM

## 2023-03-07 DIAGNOSIS — R001 Bradycardia, unspecified: Secondary | ICD-10-CM | POA: Diagnosis not present

## 2023-03-07 DIAGNOSIS — N1831 Chronic kidney disease, stage 3a: Secondary | ICD-10-CM | POA: Diagnosis present

## 2023-03-07 DIAGNOSIS — Z79890 Hormone replacement therapy (postmenopausal): Secondary | ICD-10-CM

## 2023-03-07 DIAGNOSIS — I6502 Occlusion and stenosis of left vertebral artery: Secondary | ICD-10-CM | POA: Diagnosis present

## 2023-03-07 DIAGNOSIS — I639 Cerebral infarction, unspecified: Principal | ICD-10-CM | POA: Diagnosis present

## 2023-03-07 DIAGNOSIS — E119 Type 2 diabetes mellitus without complications: Secondary | ICD-10-CM

## 2023-03-07 DIAGNOSIS — G40909 Epilepsy, unspecified, not intractable, without status epilepticus: Secondary | ICD-10-CM

## 2023-03-07 DIAGNOSIS — I129 Hypertensive chronic kidney disease with stage 1 through stage 4 chronic kidney disease, or unspecified chronic kidney disease: Secondary | ICD-10-CM | POA: Diagnosis present

## 2023-03-07 DIAGNOSIS — Z87891 Personal history of nicotine dependence: Secondary | ICD-10-CM | POA: Diagnosis not present

## 2023-03-07 DIAGNOSIS — E854 Organ-limited amyloidosis: Secondary | ICD-10-CM | POA: Diagnosis present

## 2023-03-07 DIAGNOSIS — E1122 Type 2 diabetes mellitus with diabetic chronic kidney disease: Secondary | ICD-10-CM | POA: Diagnosis present

## 2023-03-07 DIAGNOSIS — Z96642 Presence of left artificial hip joint: Secondary | ICD-10-CM | POA: Diagnosis present

## 2023-03-07 DIAGNOSIS — H409 Unspecified glaucoma: Secondary | ICD-10-CM | POA: Diagnosis present

## 2023-03-07 DIAGNOSIS — C4442 Squamous cell carcinoma of skin of scalp and neck: Secondary | ICD-10-CM | POA: Diagnosis not present

## 2023-03-07 DIAGNOSIS — H543 Unqualified visual loss, both eyes: Secondary | ICD-10-CM | POA: Diagnosis present

## 2023-03-07 DIAGNOSIS — D638 Anemia in other chronic diseases classified elsewhere: Secondary | ICD-10-CM | POA: Diagnosis not present

## 2023-03-07 DIAGNOSIS — Z79899 Other long term (current) drug therapy: Secondary | ICD-10-CM | POA: Diagnosis not present

## 2023-03-07 DIAGNOSIS — R000 Tachycardia, unspecified: Secondary | ICD-10-CM | POA: Diagnosis not present

## 2023-03-07 DIAGNOSIS — R569 Unspecified convulsions: Secondary | ICD-10-CM | POA: Diagnosis not present

## 2023-03-07 DIAGNOSIS — E039 Hypothyroidism, unspecified: Secondary | ICD-10-CM | POA: Diagnosis present

## 2023-03-07 DIAGNOSIS — D044 Carcinoma in situ of skin of scalp and neck: Secondary | ICD-10-CM | POA: Diagnosis not present

## 2023-03-07 DIAGNOSIS — D631 Anemia in chronic kidney disease: Secondary | ICD-10-CM | POA: Diagnosis present

## 2023-03-07 DIAGNOSIS — Z7984 Long term (current) use of oral hypoglycemic drugs: Secondary | ICD-10-CM | POA: Diagnosis not present

## 2023-03-07 DIAGNOSIS — Z8249 Family history of ischemic heart disease and other diseases of the circulatory system: Secondary | ICD-10-CM

## 2023-03-07 DIAGNOSIS — I6340 Cerebral infarction due to embolism of unspecified cerebral artery: Secondary | ICD-10-CM | POA: Diagnosis not present

## 2023-03-07 DIAGNOSIS — R5381 Other malaise: Secondary | ICD-10-CM | POA: Diagnosis present

## 2023-03-07 DIAGNOSIS — Z85828 Personal history of other malignant neoplasm of skin: Secondary | ICD-10-CM

## 2023-03-07 LAB — GLUCOSE, CAPILLARY
Glucose-Capillary: 112 mg/dL — ABNORMAL HIGH (ref 70–99)
Glucose-Capillary: 178 mg/dL — ABNORMAL HIGH (ref 70–99)
Glucose-Capillary: 89 mg/dL (ref 70–99)

## 2023-03-07 MED ORDER — FLEET ENEMA RE ENEM: 1.0000 | ENEMA | Freq: Once | RECTAL | Status: DC | PRN

## 2023-03-07 MED ORDER — ASPIRIN 81 MG PO TBEC
81.0000 mg | DELAYED_RELEASE_TABLET | Freq: Every day | ORAL | Status: DC
  Administered 2023-03-08 – 2023-03-16 (×9): 81 mg via ORAL
  Filled 2023-03-07 (×9): qty 1

## 2023-03-07 MED ORDER — ONDANSETRON HCL 4 MG PO TABS
4.0000 mg | ORAL_TABLET | Freq: Four times a day (QID) | ORAL | Status: DC | PRN
Start: 2023-03-07 — End: 2023-03-16

## 2023-03-07 MED ORDER — TRAMADOL HCL 50 MG PO TABS
50.0000 mg | ORAL_TABLET | Freq: Four times a day (QID) | ORAL | Status: DC | PRN
  Administered 2023-03-07 – 2023-03-08 (×2): 50 mg via ORAL
  Filled 2023-03-07 (×2): qty 1

## 2023-03-07 MED ORDER — DIPHENHYDRAMINE HCL 25 MG PO CAPS: 25.0000 mg | ORAL_CAPSULE | Freq: Four times a day (QID) | ORAL | Status: DC | PRN

## 2023-03-07 MED ORDER — ROSUVASTATIN CALCIUM 10 MG PO TABS: 10.0000 mg | ORAL_TABLET | Freq: Every day | ORAL | Status: DC

## 2023-03-07 MED ORDER — GUAIFENESIN-DM 100-10 MG/5ML PO SYRP: 10.0000 mL | ORAL_SOLUTION | Freq: Four times a day (QID) | ORAL | Status: DC | PRN

## 2023-03-07 MED ORDER — MELATONIN 5 MG PO TABS
5.0000 mg | ORAL_TABLET | Freq: Every evening | ORAL | Status: DC | PRN
  Administered 2023-03-07 – 2023-03-14 (×5): 5 mg via ORAL
  Filled 2023-03-07 (×5): qty 1

## 2023-03-07 MED ORDER — LEVETIRACETAM 250 MG PO TABS
500.0000 mg | ORAL_TABLET | Freq: Two times a day (BID) | ORAL | Status: DC
  Administered 2023-03-07 – 2023-03-16 (×18): 500 mg via ORAL
  Filled 2023-03-07 (×18): qty 2

## 2023-03-07 MED ORDER — METOPROLOL TARTRATE 25 MG PO TABS: 25.0000 mg | ORAL_TABLET | Freq: Two times a day (BID) | ORAL | Status: DC

## 2023-03-07 MED ORDER — ACETAMINOPHEN 325 MG PO TABS: 325.0000 mg | ORAL_TABLET | ORAL | Status: DC | PRN

## 2023-03-07 MED ORDER — ASPIRIN 81 MG PO TBEC: 81.0000 mg | DELAYED_RELEASE_TABLET | Freq: Every day | ORAL | Status: DC

## 2023-03-07 MED ORDER — POLYETHYLENE GLYCOL 3350 17 G PO PACK
17.0000 g | PACK | Freq: Every day | ORAL | Status: DC | PRN
  Administered 2023-03-14: 17 g via ORAL
  Filled 2023-03-07 (×2): qty 1

## 2023-03-07 MED ORDER — WHITE PETROLATUM EX OINT
TOPICAL_OINTMENT | Freq: Every day | CUTANEOUS | Status: DC
  Administered 2023-03-08 – 2023-03-14 (×4): 0.2 via TOPICAL
  Filled 2023-03-07 (×3): qty 28.35

## 2023-03-07 MED ORDER — ALUM & MAG HYDROXIDE-SIMETH 200-200-20 MG/5ML PO SUSP: 30.0000 mL | ORAL | Status: DC | PRN

## 2023-03-07 MED ORDER — METHOCARBAMOL 500 MG PO TABS: 500.0000 mg | ORAL_TABLET | Freq: Four times a day (QID) | ORAL | Status: DC | PRN

## 2023-03-07 MED ORDER — ENSURE ENLIVE PO LIQD: 237.0000 mL | Freq: Three times a day (TID) | ORAL | Status: DC

## 2023-03-07 MED ORDER — ONDANSETRON HCL 4 MG/2ML IJ SOLN
4.0000 mg | Freq: Four times a day (QID) | INTRAMUSCULAR | Status: DC | PRN
Start: 2023-03-07 — End: 2023-03-16

## 2023-03-07 MED ORDER — ROSUVASTATIN CALCIUM 5 MG PO TABS
10.0000 mg | ORAL_TABLET | Freq: Every day | ORAL | Status: DC
  Administered 2023-03-08 – 2023-03-16 (×9): 10 mg via ORAL
  Filled 2023-03-07 (×9): qty 2

## 2023-03-07 MED ORDER — FERROUS SULFATE 325 (65 FE) MG PO TABS
325.0000 mg | ORAL_TABLET | Freq: Two times a day (BID) | ORAL | Status: DC
  Administered 2023-03-07 – 2023-03-16 (×18): 325 mg via ORAL
  Filled 2023-03-07 (×19): qty 1

## 2023-03-07 MED ORDER — LEVOTHYROXINE SODIUM 88 MCG PO TABS
88.0000 ug | ORAL_TABLET | Freq: Every day | ORAL | Status: DC
Start: 2023-03-08 — End: 2023-03-16
  Administered 2023-03-08 – 2023-03-16 (×9): 88 ug via ORAL
  Filled 2023-03-07 (×9): qty 1

## 2023-03-07 MED ORDER — METOPROLOL TARTRATE 12.5 MG HALF TABLET
25.0000 mg | ORAL_TABLET | Freq: Two times a day (BID) | ORAL | Status: DC
  Administered 2023-03-07 – 2023-03-08 (×3): 25 mg via ORAL
  Filled 2023-03-07 (×4): qty 2

## 2023-03-07 MED ORDER — BISACODYL 5 MG PO TBEC
5.0000 mg | DELAYED_RELEASE_TABLET | Freq: Every day | ORAL | Status: DC | PRN
  Administered 2023-03-13: 5 mg via ORAL
  Filled 2023-03-07: qty 1

## 2023-03-07 MED ORDER — VITAMIN D (ERGOCALCIFEROL) 1.25 MG (50000 UNIT) PO CAPS
50000.0000 [IU] | ORAL_CAPSULE | ORAL | Status: DC
  Administered 2023-03-07 – 2023-03-14 (×2): 50000 [IU] via ORAL
  Filled 2023-03-07 (×2): qty 1

## 2023-03-07 MED ORDER — LEVETIRACETAM 500 MG PO TABS: 500.0000 mg | ORAL_TABLET | Freq: Two times a day (BID) | ORAL | Status: DC

## 2023-03-07 MED ORDER — VITAMIN B-12 100 MCG PO TABS
100.0000 ug | ORAL_TABLET | Freq: Every day | ORAL | Status: DC
  Administered 2023-03-08 – 2023-03-16 (×9): 100 ug via ORAL
  Filled 2023-03-07 (×10): qty 1

## 2023-03-07 NOTE — Progress Notes (Signed)
Occupational Therapy Treatment Patient Details Name: Douglas Melton MRN: [id number] DOB: 09/18/1935 Today's Date: 03/07/2023   History of present illness Pt is an 87 y/o M admitted on 03/01/23 after presenting with c/o sudden onset jerking of RUE concerning for focal seizures. 03/03/2023 MRI showed numerous small acute infarcts in left greater than right frontal and parietal lobes with moderate/severe chronic small vessel ischemic disease and chronic microhemorrhages which may reflect cerebral amyloid angiopathy  PMH: CKD, DM2, HTN, HLD, hypothyroidism, tobacco abuse, blindness, SCC & fibroxanthoma of the scalp, outer table craniectomy 02/28/23, glaucoma, blind   OT comments  Pt progressing towards goals, notable incr strength and ROM in RUE this session. Pt had just returned to bed from chair, able to perform bed level therex with yellow theraband and perform 2 grooming tasks at bed level. Pt needing assist 2/2 visual deficits during session. Pt presenting with impairments listed below, will follow acutely. Patient will benefit from intensive inpatient follow up therapy, >3 hours/day to maximize safety/ind with ADL/s functional mobility.       If plan is discharge home, recommend the following:  A lot of help with bathing/dressing/bathroom;A little help with walking and/or transfers;Direct supervision/assist for medications management;Direct supervision/assist for financial management;Assist for transportation;Help with stairs or ramp for entrance;Assistance with cooking/housework   Equipment Recommendations  Other (comment) (defer)    Recommendations for Other Services PT consult;Rehab consult    Precautions / Restrictions Precautions Precautions: Fall Precaution Comments: blind Restrictions Weight Bearing Restrictions: No       Mobility Bed Mobility               General bed mobility comments: pt declines, just got back in bed from chair    Transfers                    General transfer comment: pt declines, just got back in bed from chair     Balance                                           ADL either performed or assessed with clinical judgement   ADL Overall ADL's : Needs assistance/impaired     Grooming: Bed level;Minimal assistance;Wash/dry face;Oral care Grooming Details (indicate cue type and reason): oral care at bed level                                    Extremity/Trunk Assessment Upper Extremity Assessment Upper Extremity Assessment: RUE deficits/detail RUE Deficits / Details: 3+/5 globally in RUE RUE Coordination: decreased fine motor;decreased gross motor   Lower Extremity Assessment Lower Extremity Assessment: Generalized weakness        Vision   Additional Comments: reports is able to see bright light but cannot identify items in visual field   Perception Perception Perception: Not tested   Praxis Praxis Praxis: Not tested    Cognition Arousal: Alert Behavior During Therapy: [facility] for tasks assessed/performed Overall Cognitive Status: Within Functional Limits for tasks assessed                                 General Comments: pt somewhat resistant to new learning        Exercises Exercises: Other exercises Other Exercises Other Exercises: yellow theraband  puches forward x15 Other Exercises: yellow theraband punches overhead x15    Shoulder Instructions       General Comments VSS    Pertinent Vitals/ Pain       Pain Assessment Pain Assessment: No/denies pain  Home Living                                          Prior Functioning/Environment              Frequency  Min 1X/week        Progress Toward Goals  OT Goals(current goals can now be found in the care plan section)  Progress towards OT goals: Progressing toward goals  Acute Rehab OT Goals Patient Stated Goal: none stated OT Goal Formulation: With patient Time  For Goal Achievement: 03/16/23 Potential to Achieve Goals: Good ADL Goals Pt Will Perform Upper Body Dressing: with supervision;sitting Pt Will Perform Lower Body Dressing: with supervision;sitting/lateral leans;sit to/from stand Pt Will Transfer to Toilet: with supervision;ambulating;regular height toilet Pt/caregiver will Perform Home Exercise Program: With written HEP provided;Right Upper extremity;Increased strength;Increased ROM;With Supervision Additional ADL Goal #1: pt will perform bed mobility with supervision in prep for ADLs  Plan      Co-evaluation                 AM-PAC OT "6 Clicks" Daily Activity     Outcome Measure   Help from another person eating meals?: A Little Help from another person taking care of personal grooming?: A Little Help from another person toileting, which includes using toliet, bedpan, or urinal?: A Lot Help from another person bathing (including washing, rinsing, drying)?: A Lot Help from another person to put on and taking off regular upper body clothing?: A Little Help from another person to put on and taking off regular lower body clothing?: A Lot 6 Click Score: 15    End of Session    OT Visit Diagnosis: Unsteadiness on feet (R26.81);Other abnormalities of gait and mobility (R26.89);Muscle weakness (generalized) (M62.81)   Activity Tolerance Patient tolerated treatment well   Patient Left in bed;with call bell/phone within reach;with bed alarm set;with family/visitor present;with nursing/sitter in room   Nurse Communication Mobility status        Time: 4098-1191 OT Time Calculation (min): 18 min  Charges: OT General Charges $OT Visit: 1 Visit OT Treatments $Therapeutic Activity: 8-22 mins   Carver Fila, OTD, OTR/L SecureChat Preferred Acute Rehab [phone]   Keyron Pokorski K Koonce 03/07/2023, 1:12 PM

## 2023-03-07 NOTE — Plan of Care (Signed)
  Problem: Education: Goal: Ability to describe self-care measures that may prevent or decrease complications (Diabetes Survival Skills Education) will improve Outcome: Progressing Goal: Individualized Educational Video(s) Outcome: Progressing   Problem: Coping: Goal: Ability to adjust to condition or change in health will improve Outcome: Progressing   Problem: Fluid Volume: Goal: Ability to maintain a balanced intake and output will improve Outcome: Progressing   Problem: Health Behavior/Discharge Planning: Goal: Ability to identify and utilize available resources and services will improve Outcome: Progressing Goal: Ability to manage health-related needs will improve Outcome: Progressing   Problem: Metabolic: Goal: Ability to maintain appropriate glucose levels will improve Outcome: Progressing   Problem: Nutritional: Goal: Maintenance of adequate nutrition will improve Outcome: Progressing Goal: Progress toward achieving an optimal weight will improve Outcome: Progressing   Problem: Skin Integrity: Goal: Risk for impaired skin integrity will decrease Outcome: Progressing   Problem: Tissue Perfusion: Goal: Adequacy of tissue perfusion will improve Outcome: Progressing   Problem: Education: Goal: Knowledge of General Education information will improve Description: Including pain rating scale, medication(s)/side effects and non-pharmacologic comfort measures Outcome: Progressing   Problem: Health Behavior/Discharge Planning: Goal: Ability to manage health-related needs will improve Outcome: Progressing   Problem: Clinical Measurements: Goal: Ability to maintain clinical measurements within normal limits will improve Outcome: Progressing Goal: Will remain free from infection Outcome: Progressing Goal: Diagnostic test results will improve Outcome: Progressing Goal: Respiratory complications will improve Outcome: Progressing Goal: Cardiovascular complication will  be avoided Outcome: Progressing   Problem: Activity: Goal: Risk for activity intolerance will decrease Outcome: Progressing   Problem: Nutrition: Goal: Adequate nutrition will be maintained Outcome: Progressing   Problem: Coping: Goal: Level of anxiety will decrease Outcome: Progressing   Problem: Elimination: Goal: Will not experience complications related to bowel motility Outcome: Progressing Goal: Will not experience complications related to urinary retention Outcome: Progressing   Problem: Pain Management: Goal: General experience of comfort will improve Outcome: Progressing   Problem: Safety: Goal: Ability to remain free from injury will improve Outcome: Progressing   Problem: Skin Integrity: Goal: Risk for impaired skin integrity will decrease Outcome: Progressing   Problem: Education: Goal: Knowledge of disease or condition will improve Outcome: Progressing Goal: Understanding of medication regimen will improve Outcome: Progressing Goal: Individualized Educational Video(s) Outcome: Progressing   Problem: Activity: Goal: Ability to tolerate increased activity will improve Outcome: Progressing   Problem: Cardiac: Goal: Ability to achieve and maintain adequate cardiopulmonary perfusion will improve Outcome: Progressing   Problem: Health Behavior/Discharge Planning: Goal: Ability to safely manage health-related needs after discharge will improve Outcome: Progressing   Problem: Education: Goal: Expressions of having a comfortable level of knowledge regarding the disease process will increase Outcome: Progressing   Problem: Coping: Goal: Ability to adjust to condition or change in health will improve Outcome: Progressing Goal: Ability to identify appropriate support needs will improve Outcome: Progressing   Problem: Health Behavior/Discharge Planning: Goal: Compliance with prescribed medication regimen will improve Outcome: Progressing   Problem:  Medication: Goal: Risk for medication side effects will decrease Outcome: Progressing   Problem: Clinical Measurements: Goal: Complications related to the disease process, condition or treatment will be avoided or minimized Outcome: Progressing Goal: Diagnostic test results will improve Outcome: Progressing   Problem: Safety: Goal: Verbalization of understanding the information provided will improve Outcome: Progressing   Problem: Self-Concept: Goal: Level of anxiety will decrease Outcome: Progressing Goal: Ability to verbalize feelings about condition will improve Outcome: Progressing

## 2023-03-07 NOTE — Discharge Instructions (Signed)
Douglas Melton,  You were in the hospital and were found to have a stroke. The neurologist has recommended new medication for you. The physical therapist have recommended acute inpatient rehabilitation. Your glipizide was discontinued because of risk for low blood sugar and you were started on metoprolol for your irregular rhythm (extra beats) without evidence of atrial fibrillation.

## 2023-03-07 NOTE — Progress Notes (Signed)
Inpatient Rehab Admissions Coordinator:    I have a CIR bed for this pt. RN may call report to [phone].     Pt. To d/c to CIR for an estimated 7-10 days with the goal of reaching mod I to supervision level and returning home with assistance from his wife.   Megan Salon, MS, CCC-SLP Rehab Admissions Coordinator  [phone] (celll) [phone] (office)

## 2023-03-07 NOTE — PMR Pre-admission (Addendum)
PMR Admission Coordinator Pre-Admission Assessment   Patient: Douglas Melton is an 87 y.o., male MRN: [id number] DOB: 12/18/1935 Height: 6\' 2"  (188 cm) Weight: 60.5 kg   Insurance Information HMO: Yes    PPO:       PCP:       IPA:       80/20:       OTHER: Group [id number] PRIMARY: [employer] medicare      Policy#: [id number]      Subscriber: patient CM Name:       Phone#:      Fax#: [phone] Received call from [facility] and they gave approval for admit on 11/10 for 03/06/23-03/11/23. Pre-Cert#: [id number]      Employer: Retired Benefits:  Phone #: [phone]     Name: [facility] provider portal on line Eff. Date: 04/26/22     Deduct: $0      Out of Pocket Max: $3600 (met $82.23)      Life Max: N/A CIR: $295 for days 1-5      SNF: $0 for days 1-20; $203 for days 21-100 Outpatient: med nec     Co-Pay: $20/visit Home Health: 100%      Co-Pay: none DME: 80%     Co-Pay: 20% Providers: in network  SECONDARY:       Policy#:      Phone#:    Artist:       Phone#:    The Data processing manager" for patients in Inpatient Rehabilitation Facilities with attached "Privacy Act Statement-Health Care Records" was provided and verbally reviewed with: Patient and Family   Emergency Contact Information Contact Information       Name Relation Home Work Mobile    [name],[name] Spouse     [phone]    [name], [name]     [phone]         Other Contacts   None on File        Current Medical History  Patient Admitting Diagnosis: Seizure   History of Present Illness:  A 87 y.o. male  has a past medical history of Chronic kidney disease, Diabetes mellitus without complication (HCC), Glaucoma, Hyperlipidemia, and Hypertension.  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 He presented to the [facility] ED 03/01/23 with seizure like activity. This morning, patient noticed painful shaking  of his right arm which was uncontrollable and lasted about 5 or 6 minutes.  Afterwards, he was unable to move his right arm.  He had 3 more of these episodes while in the ambulance and in the emergency department.  He states he has never had a serious head injury, has no family members with seizures and has never had meningitis or encephalitis, never had febrile seizures as a child and did not have any problems with his birth or development.  He has never had seizure before today. Consulted with neurology and loaded with Keppra intravenously. 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 CTA head and neck with moderate stenosis in both ICAs, right ACA and right PCA. Severe stenosis of left vertebral artery and 60-65 stenosis of the right ICA. 2D echo with EF of 50 to 55%. Given the CAA pattern and MRI neurology did not feel the patient is a candidate for anticoagulation and would continue aspirin 81 mg daily. Hemoglobin A1c 5.3%.  Telemetry showed sinus tachycardia with PACs. There was initial concern for atrial fibrillation but EKG was not consistent with this. Cardiology was consulted and reviewed clinical course and agreed no diagnosis of atrial fibrillation. Receiving metoprolol tartrate 25 mg twice daily. Started on Crestor 10 mg daily. Neurology does not recommend high intensity statin due to LDL near goal and pattern of CAA on MRI.  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 Recommendation for dressing removal on 11/12. Per discussion with outpatient neurosurgeon, Dr. Christoper Allegra, recommendation to cut sutures of bolster and to remove bolster; synthetic matrix underlying should stay in place and be kept moist with application of Xeroform gauze and petroleum jelly daily. PT/OT  evaluations completed with recommendations for acute inpatient rehab admission.   Patient's medical record from [facility] has been reviewed by the rehabilitation admission coordinator and physician.   Past Medical History      Past Medical History:  Diagnosis Date   Chronic kidney disease     Diabetes mellitus without complication (HCC)     Glaucoma     Hyperlipidemia     Hypertension            Has the patient had major surgery during 100 days prior to admission? Yes   Family History   family history includes Heart disease in his father; Hypertension in his father; Other in his mother.   Current Medications  Current Medications    Current Facility-Administered Medications:    acetaminophen (TYLENOL) tablet 650 mg, 650 mg, Oral, Q6H PRN **OR** acetaminophen (TYLENOL) suppository 650 mg, 650 mg, Rectal, Q6H PRN, Orland Mustard, MD   feeding supplement (ENSURE ENLIVE / ENSURE PLUS) liquid 237 mL, 237 mL, Oral, TID BM, Narda Bonds, MD, 237 mL at 03/03/23 0949   ferrous sulfate tablet 325 mg, 325 mg, Oral, BID WC, Orland Mustard, MD, 325 mg at 03/03/23 0705   insulin aspart (novoLOG) injection 0-9 Units, 0-9 Units, Subcutaneous, TID WC, Orland Mustard, MD, 2 Units at 03/03/23 1200   levETIRAcetam (KEPPRA) tablet 500 mg, 500 mg, Oral, BID, Rexford Maus, RPH   levothyroxine (SYNTHROID) tablet 88 mcg, 88 mcg, Oral, Q0600, Orland Mustard, MD, 88 mcg at 03/03/23 0516   LORazepam (ATIVAN) injection 4 mg, 4 mg, Intravenous, Q5 Min x 2 PRN, Orland Mustard, MD   metoprolol tartrate (LOPRESSOR) injection 5 mg, 5 mg, Intravenous, Q6H PRN, Orland Mustard, MD   metoprolol tartrate (LOPRESSOR) tablet 25 mg, 25 mg, Oral, BID, Orland Mustard, MD, 25 mg at 03/03/23 0944   traMADol (ULTRAM) tablet 50 mg, 50 mg, Oral, Q6H PRN, Orland Mustard, MD   white petrolatum (VASELINE) gel, , Topical, Daily, Narda Bonds, MD, 0.2 Application at 03/03/23 0944     Patients Current Diet:   Diet Order                  Diet regular Room service appropriate? Yes with Assist; Fluid consistency: Thin  Diet effective now                         Precautions / Restrictions Precautions Precautions: Fall Precaution Comments: blind; EEG Restrictions Weight Bearing Restrictions: No    Has the patient had 2 or more falls or a fall with injury in the past year? No   Prior Activity Level Limited Community (1-2x/wk): Went out 1-2  X a week   Prior Functional Level Self Care: Did the patient need help bathing, dressing, using the toilet or eating? Needed some help   Indoor Mobility: Did the patient need assistance with walking from room to room (with or without device)? Independent   Stairs: Did the patient need assistance with internal or external stairs (with or without device)? Independent   Functional Cognition: Did the patient need help planning regular tasks such as shopping or remembering to take medications? Independent   Patient Information Are you of Hispanic, Latino/a,or Spanish origin?: A. No, not of Hispanic, Latino/a, or Spanish origin What is your race?: A. White Do you need or want an interpreter to communicate with a doctor or health care staff?: 0. No   Patient's Response To:  Health Literacy and Transportation Is the patient able to respond to health literacy and transportation needs?: Yes Health Literacy - How often do you need to have someone help you when you read instructions, pamphlets, or other written material from your doctor or pharmacy?: Always (Secondary to blindness) In the past 12 months, has lack of transportation kept you from medical appointments or from getting medications?: No In the past 12 months, has lack of transportation kept you from meetings, work, or from getting things needed for daily living?: No   Journalist, newspaper / Equipment Home Equipment: Shower seat, Agricultural consultant (2 wheels), Grab bars - tub/shower   Prior  Device Use: Indicate devices/aids used by the patient prior to current illness, exacerbation or injury? None of the above   Current Functional Level Cognition   Overall Cognitive Status: Within Functional Limits for tasks assessed Orientation Level: Oriented X4    Extremity Assessment (includes Sensation/Coordination)   Upper Extremity Assessment: RUE deficits/detail RUE Deficits / Details: limited shoulder ROM, can hold against gravity but not against resistance, can flex/ext elbow and wrist, 2/5 grasp, cannot form full fist  Lower Extremity Assessment: Generalized weakness     ADLs   Overall ADL's : Needs assistance/impaired Eating/Feeding: Set up Grooming: Set up, Sitting Upper Body Bathing: Minimal assistance, Sitting Lower Body Bathing: Moderate assistance, Sitting/lateral leans Upper Body Dressing : Minimal assistance, Sitting Lower Body Dressing: Moderate assistance, Sitting/lateral leans Toilet Transfer: Minimal assistance, Moderate assistance Toileting- Clothing Manipulation and Hygiene: Maximal assistance Functional mobility during ADLs: Minimal assistance, Moderate assistance     Mobility   Overal bed mobility: Needs Assistance Bed Mobility: Supine to Sit, Sit to Supine Supine to sit: Min assist Sit to supine: Min assist General bed mobility comments: light min A to eleavte trunk as pt requesting HHA     Transfers   Overall transfer level: Needs assistance Equipment used: Rolling walker (2 wheels) Transfers: Sit to/from Stand Sit to Stand: Min assist General transfer comment: light min A to boost up to stand     Ambulation / Gait / Stairs / Psychologist, prison and probation services   Ambulation/Gait Pre-gait activities: able to march in place x20 and take a few side steps toward HOB, bil knees flexed in stance     Posture / Balance Dynamic Sitting Balance Sitting balance - Comments: supervision static sitting EOB Balance Overall balance assessment: Needs assistance Sitting-balance  support: Feet supported Sitting balance-Leahy Scale: Good Sitting balance - Comments: supervision static sitting EOB Standing balance support: Bilateral upper extremity supported Standing balance-Leahy Scale: Poor Standing balance comment: ue support in standing     Special needs/care consideration Skin Post op dressing to scalp, Diabetic management h/o DM, and Special service needs Note patient  is blind.    Previous Home Environment (from acute therapy documentation) Living Arrangements: Spouse/significant other Available Help at Discharge: Family Type of Home: House Home Layout: Multi-level, Able to live on main level with bedroom/bathroom Alternate Level Stairs-Number of Steps: stair lift from basement to main level Home Access: Other (comment) Bathroom Shower/Tub: Health visitor: Handicapped height   Discharge Living Setting Plans for Discharge Living Setting: Patient's home, House, Lives with (comment) (Lives with wife.) Type of Home at Discharge: House Discharge Home Layout: Two level, Laundry or work area in basement Alternate Level Stairs-Number of Steps: Has a stair lift from the basement to the main level. Discharge Home Access: Other (comment) (Stair lift from basement.  Garage is in the basement.) Discharge Bathroom Shower/Tub: Walk-in shower, Door Discharge Bathroom Toilet: Handicapped height Discharge Bathroom Accessibility: No   Social/Family/Support Systems Patient Roles: Spouse, Parent Contact Information: Steffan Kinzie - spouse - [phone] Anticipated Caregiver: Wife Ability/Limitations of Caregiver: Wife is retired and can assist. Medical laboratory scientific officer: 24/7 Discharge Plan Discussed with Primary Caregiver: Yes Is Caregiver In Agreement with Plan?: Yes Does Caregiver/Family have Issues with Lodging/Transportation while Pt is in Rehab?: No   Goals Patient/Family Goal for Rehab: PT/OT supervision goals Expected length of stay: 10-12  days Pt/Family Agrees to Admission and willing to participate: Yes Program Orientation Provided & Reviewed with Pt/Caregiver Including Roles  & Responsibilities: Yes   Decrease burden of Care through IP rehab admission: N/A   Possible need for SNF placement upon discharge: Not planned   Patient Condition: I have reviewed medical records from [facility], spoken with CM, and patient and spouse. I met with patient at the bedside for inpatient rehabilitation assessment.  Patient will benefit from ongoing PT and OT, can actively participate in 3 hours of therapy a day 5 days of the week, and can make measurable gains during the admission.  Patient will also benefit from the coordinated team approach during an Inpatient Acute Rehabilitation admission.  The patient will receive intensive therapy as well as Rehabilitation physician, nursing, social worker, and care management interventions.  Due to bladder management, bowel management, safety, skin/wound care, disease management, medication administration, pain management, and patient education the patient requires 24 hour a day rehabilitation nursing.  The patient is currently min A with mobility and basic ADLs.  Discharge setting and therapy post discharge at home with home health is anticipated.  Patient has agreed to participate in the Acute Inpatient Rehabilitation Program and will admit today.   Preadmission Screen Completed By:  Trish Mage, 03/03/2023 3:31 PM ______________________________________________________________________   Discussed status with Dr. Riley Kill on 03/07/23 at 1217 and received approval for admission today.   Admission Coordinator:  Trish Mage, RN, time 1217/Date 03/07/23    Assessment/Plan: Diagnosis: debility/seizures Does the need for close, 24 hr/day Medical supervision in concert with the patient's rehab needs make it unreasonable for this patient to be served in a less intensive setting? Yes Co-Morbidities  requiring supervision/potential complications: scalp wound/dressing, CKD, DM Due to bladder management, bowel management, safety, skin/wound care, disease management, medication administration, pain management, and patient education, does the patient require 24 hr/day rehab nursing? Yes Does the patient require coordinated care of a physician, rehab nurse, PT, OT, and SLP to address physical and functional deficits in the context of the above medical diagnosis(es)? Yes Addressing deficits in the following areas: balance, endurance, locomotion, strength, transferring, bowel/bladder control, bathing, dressing, feeding, grooming, toileting, and psychosocial support Can the patient actively participate  in an intensive therapy program of at least 3 hrs of therapy 5 days a week? Yes The potential for patient to make measurable gains while on inpatient rehab is excellent Anticipated functional outcomes upon discharge from inpatient rehab: supervision PT, supervision OT, n/a SLP Estimated rehab length of stay to reach the above functional goals is: 9-11 days Anticipated discharge destination: Home 10. Overall Rehab/Functional Prognosis: excellent     MD Signature: Ranelle Oyster, MD, [facility] Physical Medicine & Rehabilitation Medical Director Rehabilitation Services 03/07/2023

## 2023-03-07 NOTE — H&P (Signed)
Physical Medicine and Rehabilitation Admission H&P     CC: Functional deficits secondary to small acute infarcts in the left greater than right frontal and parietal lobes.    HPI: Douglas Melton is an 87 year old male who presented to the emergency department on 03/01/2023 complaining of seizure-like activity exhibited by right upper extremity jerking movements.  He then described right upper extremity weakness.  He has a history of squamous cell carcinoma of the scalp and had undergone resection the day before by Dr. Christoper Allegra.  No history of seizure disorder.  Consulted with neurology and loaded with Keppra intravenously. LTM EEG negative for seizures. 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  CTA head and neck with moderate stenosis in both ICAs, right ACA and right PCA.  Severe stenosis of left vertebral artery and 60-65 stenosis of the right ICA.  2D echo with EF of 50 to 55%.  Given the CAA pattern and MRI neurology did not feel the patient is a candidate for anticoagulation and would continue aspirin 81 mg daily.  Hemoglobin A1c 5.3%.  Telemetry showed sinus tachycardia with PACs.  There was initial concern for atrial fibrillation but EKG was not consistent with this.  Cardiology was consulted and reviewed clinical course and agreed no diagnosis of atrial fibrillation.  Receiving metoprolol tartrate 25 mg twice daily.  Started on Crestor 10 mg daily.  Neurology does not recommend high intensity statin due to LDL near goal and pattern of CAA on MRI.  Patient has a history of diabetes mellitus type 2 controlled with glipizide at home.  This has been discontinued.  His past medical history is also significant for chronic kidney disease stage IIIa, hypothyroidism, bilateral blindness. Patient requiring min A for step pivot transfer chair>EOB and able to progress short gait away from and back to EOB with min A and HHA on R.  Tolerating diet. The patient requires inpatient medicine and rehabilitation evaluations and services for ongoing dysfunction secondary to acute bilateral embolic infarcts.   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 Recommendation for dressing removal on 11/12. Per discussion with outpatient neurosurgeon, Dr. Christoper Allegra, recommendation to cut sutures of bolster and to remove bolster; synthetic matrix underlying should stay in place and be kept moist with application of Xeroform gauze and petroleum jelly daily. Information relayed to inpatient rehab team including Dr. Anselmo Rod number for step-by-step walkthrough.    Wife at bedside. He denies pain. Eating "fair". Review of Systems  Constitutional:  Negative for fever.  HENT:  Negative for hearing loss.   Eyes:        Blind  Respiratory:  Negative for cough.   Cardiovascular:  Negative for chest pain.  Gastrointestinal:  Negative for heartburn.  Genitourinary:  Negative for dysuria.  Musculoskeletal:  Negative for myalgias.  Skin:  Negative for rash.  Neurological:  Positive for focal weakness and weakness.  Psychiatric/Behavioral:  Negative for depression.         Past Medical History:  Diagnosis Date   Chronic kidney disease     Diabetes mellitus without complication (HCC)     Glaucoma     Hyperlipidemia     Hypertension               Past Surgical History:  Procedure Laterality Date   CHOLECYSTECTOMY       TOTAL HIP ARTHROPLASTY  Left 02/15/2020    Procedure: TOTAL HIP ARTHROPLASTY  ANTERIOR APPROACH;  Surgeon: Samson Frederic, MD;  Location: [facility] OR;  Service: Orthopedics;  Laterality: Left;             Family History  Problem Relation Age of Onset   Heart disease Father     Hypertension Father     Other Mother          Social History:  reports that he has quit smoking. His smoking use included cigarettes. He has never  used smokeless tobacco. He reports that he does not drink alcohol and does not use drugs. Allergies:  Allergies       Allergies  Allergen Reactions   Adhesive [Tape] Rash   Fluorescein-Benoxinate Rash      FA dye (Fluress- eyes)       Valacyclovir Anxiety      Nervous, anxious and jittery.            Medications Prior to Admission  Medication Sig Dispense Refill   ferrous sulfate 325 (65 FE) MG tablet Take 1 tablet (325 mg total) by mouth 2 (two) times daily with a meal. 30 tablet 3   folic acid (FOLVITE) 400 MCG tablet Take 400 mcg by mouth daily.       glipiZIDE (GLUCOTROL XL) 2.5 MG 24 hr tablet Take 2.5 mg by mouth daily.       levothyroxine (SYNTHROID) 88 MCG tablet Take 88 mcg by mouth daily.       traMADol (ULTRAM) 50 MG tablet Take 50 mg by mouth every 6 (six) hours as needed for severe pain (pain score 7-10).       vitamin B-12 (CYANOCOBALAMIN) 100 MCG tablet Take 100 mcg by mouth daily.       Vitamin D, Ergocalciferol, (DRISDOL) 1.25 MG (50000 UNIT) CAPS capsule Take 50,000 Units by mouth every 7 (seven) days.       celecoxib (CELEBREX) 200 MG capsule Take 200 mg by mouth 2 (two) times daily. For 28 days. (Patient not taking: Reported on 03/01/2023)                  Home: Home Living Family/patient expects to be discharged to:: Inpatient rehab Living Arrangements: Spouse/significant other Available Help at Discharge: Family Type of Home: House Home Access: Other (comment) Home Layout: Multi-level, Able to live on main level with bedroom/bathroom Alternate Level Stairs-Number of Steps: stair lift from basement to main level Bathroom Shower/Tub: Health visitor: Handicapped height Home Equipment: Information systems manager, Agricultural consultant (2 wheels), Grab bars - tub/shower   Functional History: Prior Function Prior Level of Function : Independent/Modified Independent Mobility Comments: Ambulatory in home without AD, furniture walks PRN, uses stair lift to access  main level from basement. ADLs Comments: ind   Functional Status:  Mobility: Bed Mobility Overal bed mobility: Needs Assistance Bed Mobility: Sit to Supine Supine to sit: Min assist Sit to supine: Min assist General bed mobility comments: light min A to return LEs to bed Transfers Overall transfer level: Needs assistance Equipment used: None, 1 person hand held assist Transfers: Sit to/from Stand, Bed to chair/wheelchair/BSC Sit to Stand: Min assist Bed to/from chair/wheelchair/BSC transfer type:: Step pivot Step pivot transfers: Min assist General transfer comment: light min A to boost up to stand with HHA from recliner x1 and EOB x2, min A to step pivot to EOB Ambulation/Gait Ambulation/Gait assistance: Min assist Gait Distance (Feet): 5 Feet Assistive device: 1 person hand held assist Gait Pattern/deviations:  Step-through pattern, Decreased stride length, Narrow base of support General Gait Details: short gait 5' forward/back from/to bed to window with HHA on R, very short shuffling steps with narrow BOS, increased cues for safety due to pt vision impairment at baseline Gait velocity: decr Pre-gait activities: able to march in place x20 and take a few side steps toward HOB, bil knees flexed in stance   ADL: ADL Overall ADL's : Needs assistance/impaired Eating/Feeding: Set up Grooming: Set up, Sitting Upper Body Bathing: Minimal assistance, Sitting Lower Body Bathing: Moderate assistance, Sitting/lateral leans Upper Body Dressing : Minimal assistance, Sitting Lower Body Dressing: Moderate assistance, Sitting/lateral leans Toilet Transfer: Minimal assistance, Moderate assistance Toileting- Clothing Manipulation and Hygiene: Maximal assistance Functional mobility during ADLs: Minimal assistance, Moderate assistance General ADL Comments: Issued and educated in use of insulated cup with lid and handle. Instructed to use handle on R and both hands when drinking hot beverages for  safety   Cognition: Cognition Overall Cognitive Status: Within Functional Limits for tasks assessed Orientation Level: Oriented X4 Cognition Arousal: Alert Behavior During Therapy: WFL for tasks assessed/performed Overall Cognitive Status: Within Functional Limits for tasks assessed General Comments: pt somewhat resistant to new learning   Physical Exam: Blood pressure 105/71, pulse 87, temperature (!) 97.5 F (36.4 C), temperature source Oral, resp. rate 17, height 6\' 2"  (1.88 m), weight 60.5 kg, SpO2 97%. Physical Exam Constitutional:      General: He is not in acute distress. HENT:     Head:     Comments: Surgical site with bolster dressing in place with peri-wound erythema. An abrasion is posterior to dressing site)    Right Ear: External ear normal.     Left Ear: External ear normal.     Nose: Nose normal.  Cardiovascular:     Rate and Rhythm: Normal rate and regular rhythm.     Heart sounds: No murmur heard.    No gallop.  Pulmonary:     Effort: Pulmonary effort is normal. No respiratory distress.     Breath sounds: Normal breath sounds. No wheezing.  Abdominal:     General: Bowel sounds are normal. There is no distension.     Palpations: Abdomen is soft.  Genitourinary:    Comments: foley Musculoskeletal:        General: No swelling or tenderness. Normal range of motion.     Cervical back: Normal range of motion.     Comments: Early signs of pressure injury to both heels L > R  Skin:    General: Skin is warm and dry.  Neurological:     Mental Status: He is alert and oriented to person, place, and time.     Comments: Alert and oriented x 3. Normal insight and awareness. Intact Memory. Normal language and speech. Cranial nerve exam unremarkable although pt is blind in both eyes, can see light from left eye only. MMT: RUE 4/5 prox to distal with grip 4-. LUE 4+ to 5/5. LE's 4/5 prox to 4+/5 distal. Sensory exam normal for light touch and pain in all 4 limbs. No limb  ataxia or cerebellar signs. No abnormal tone appreciated.     Psychiatric:        Mood and Affect: Mood normal.        Behavior: Behavior normal.        Lab Results Last 48 Hours        Results for orders placed or performed during the hospital encounter of 03/01/23 (from the past 48  hour(s))  Glucose, capillary     Status: Abnormal    Collection Time: 03/05/23  5:12 PM  Result Value Ref Range    Glucose-Capillary 117 (H) 70 - 99 mg/dL      Comment: Glucose reference range applies only to samples taken after fasting for at least 8 hours.  Glucose, capillary     Status: Abnormal    Collection Time: 03/05/23  9:29 PM  Result Value Ref Range    Glucose-Capillary 107 (H) 70 - 99 mg/dL      Comment: Glucose reference range applies only to samples taken after fasting for at least 8 hours.  Glucose, capillary     Status: Abnormal    Collection Time: 03/06/23  6:03 AM  Result Value Ref Range    Glucose-Capillary 105 (H) 70 - 99 mg/dL      Comment: Glucose reference range applies only to samples taken after fasting for at least 8 hours.  Glucose, capillary     Status: Abnormal    Collection Time: 03/06/23 12:32 PM  Result Value Ref Range    Glucose-Capillary 101 (H) 70 - 99 mg/dL      Comment: Glucose reference range applies only to samples taken after fasting for at least 8 hours.  Glucose, capillary     Status: Abnormal    Collection Time: 03/06/23  4:36 PM  Result Value Ref Range    Glucose-Capillary 129 (H) 70 - 99 mg/dL      Comment: Glucose reference range applies only to samples taken after fasting for at least 8 hours.  Glucose, capillary     Status: Abnormal    Collection Time: 03/06/23  9:21 PM  Result Value Ref Range    Glucose-Capillary 111 (H) 70 - 99 mg/dL      Comment: Glucose reference range applies only to samples taken after fasting for at least 8 hours.    Comment 1 Notify RN    Glucose, capillary     Status: Abnormal    Collection Time: 03/07/23  6:19 AM  Result  Value Ref Range    Glucose-Capillary 112 (H) 70 - 99 mg/dL      Comment: Glucose reference range applies only to samples taken after fasting for at least 8 hours.    Comment 1 Notify RN    Glucose, capillary     Status: Abnormal    Collection Time: 03/07/23 11:54 AM  Result Value Ref Range    Glucose-Capillary 178 (H) 70 - 99 mg/dL      Comment: Glucose reference range applies only to samples taken after fasting for at least 8 hours.    Comment 1 Notify RN      Comment 2 Document in Chart        Imaging Results (Last 48 hours)  No results found.         Blood pressure 105/71, pulse 87, temperature (!) 97.5 F (36.4 C), temperature source Oral, resp. rate 17, height 6\' 2"  (1.88 m), weight 60.5 kg, SpO2 97%.   Medical Problem List and Plan: 1. Functional deficits secondary to bilateral frontal and parietal lobe infarcts as well as cerebral amyloid angiopathy             -patient may shower             -ELOS/Goals: 9-11 days, supervision goals   2.  Antithrombotics: -DVT/anticoagulation:  Mechanical:  Antiembolism stockings, knee (TED hose) Bilateral lower extremities             -  antiplatelet therapy: aspirin 81 mg daily. No AC due to cerebral amyloid angiopathy   3. Pain Management: Tylenol as needed   4. Mood/Behavior/Sleep: LCSW to evaluate and provide emotional support             -antipsychotic agents: n/a   5. Neuropsych/cognition: This patient is capable of making decisions on his own behalf.   6. Skin/Wound Care: Routine skin care checks             -foam borders to heels/Prevalon boots   7. Fluids/Electrolytes/Nutrition: Routine Is and Os and follow-up chemistries   8: Seizure-like activity: continue Keppra 500 mg BID             -follow-up GNA   9: Hyperlipidemia: continue statin   10: Bilateral blindness: can see light/dark through left eye only   11: Hypothyroidism: continue Synthroid   12: DM-2: A1C = 5.8% (home glipizide discontinued)             -dc  SSI now and give Ensure with meals             -carb modified diet   13: CKD stage III: Baseline creatinine ~1.4; stable             -follow-up BMP   14: SSC scalp s/p resection: remove dressing 11/12>>recommendation to cut sutures of bolster and to remove bolster; synthetic matrix underlying should stay in place and be kept moist with application of Xeroform gauze and petroleum jelly daily             -follow-up with Dr. Christoper Allegra (phone [phone] if questions re: dressing)   15: Sinus tachycardia with PACs; atrial fib ruled out             -on metoprolol 25 mg BID   16: Anemia of chronic disease/? iron deficiency: continue oral iron             -follow-up CBC       Milinda Antis, PA-C 03/07/2023  I have personally performed a face to face diagnostic evaluation of this patient and formulated the key components of the plan.  Additionally, I have personally reviewed laboratory data, imaging studies, as well as relevant notes and concur with the physician assistant's documentation above.  The patient's status has not changed from the original H&P.  Any changes in documentation from the acute care chart have been noted above.  Ranelle Oyster, MD, [place] [facility]

## 2023-03-07 NOTE — H&P (Signed)
Physical Medicine and Rehabilitation Admission H&P   CC: Functional deficits secondary to small acute infarcts in the left greater than right frontal and parietal lobes.   HPI: Douglas Melton is an 87 year old male who presented to the emergency department on 03/01/2023 complaining of seizure-like activity exhibited by right upper extremity jerking movements.  He then described right upper extremity weakness.  He has a history of squamous cell carcinoma of the scalp and had undergone resection the day before by Dr. Christoper Allegra.  No history of seizure disorder.  Consulted with neurology and loaded with Keppra intravenously. LTM EEG negative for seizures. 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  CTA head and neck with moderate stenosis in both ICAs, right ACA and right PCA.  Severe stenosis of left vertebral artery and 60-65 stenosis of the right ICA.  2D echo with EF of 50 to 55%.  Given the CAA pattern and MRI neurology did not feel the patient is a candidate for anticoagulation and would continue aspirin 81 mg daily.  Hemoglobin A1c 5.3%.  Telemetry showed sinus tachycardia with PACs.  There was initial concern for atrial fibrillation but EKG was not consistent with this.  Cardiology was consulted and reviewed clinical course and agreed no diagnosis of atrial fibrillation.  Receiving metoprolol tartrate 25 mg twice daily.  Started on Crestor 10 mg daily.  Neurology does not recommend high intensity statin due to LDL near goal and pattern of CAA on MRI.  Patient has a history of diabetes mellitus type 2 controlled with glipizide at home.  This has been discontinued.  His past medical history is also significant for chronic kidney disease stage IIIa, hypothyroidism, bilateral blindness. Patient requiring min A for step pivot transfer chair>EOB and able to progress short gait away from and back to EOB with min A and HHA on R. Tolerating  diet. The patient requires inpatient medicine and rehabilitation evaluations and services for ongoing dysfunction secondary to acute bilateral embolic infarcts.  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 Recommendation for dressing removal on 11/12. Per discussion with outpatient neurosurgeon, Dr. Christoper Allegra, recommendation to cut sutures of bolster and to remove bolster; synthetic matrix underlying should stay in place and be kept moist with application of Xeroform gauze and petroleum jelly daily. Information relayed to inpatient rehab team including Dr. Anselmo Rod number for step-by-step walkthrough.   Wife at bedside. He denies pain. Eating "fair". Review of Systems  Constitutional:  Negative for fever.  HENT:  Negative for hearing loss.   Eyes:        Blind  Respiratory:  Negative for cough.   Cardiovascular:  Negative for chest pain.  Gastrointestinal:  Negative for heartburn.  Genitourinary:  Negative for dysuria.  Musculoskeletal:  Negative for myalgias.  Skin:  Negative for rash.  Neurological:  Positive for focal weakness and weakness.  Psychiatric/Behavioral:  Negative for depression.    Past Medical History:  Diagnosis Date   Chronic kidney disease    Diabetes mellitus without complication (HCC)    Glaucoma    Hyperlipidemia    Hypertension    Past Surgical History:  Procedure Laterality Date   CHOLECYSTECTOMY     TOTAL HIP ARTHROPLASTY Left 02/15/2020   Procedure: TOTAL HIP ARTHROPLASTY  ANTERIOR APPROACH;  Surgeon: Samson Frederic, MD;  Location: [facility] OR;  Service: Orthopedics;  Laterality: Left;  Family History  Problem Relation Age of Onset   Heart disease Father    Hypertension Father    Other Mother    Social History:  reports that he has quit smoking. His smoking use included cigarettes. He has never used smokeless tobacco. He reports that he does not drink  alcohol and does not use drugs. Allergies:  Allergies  Allergen Reactions   Adhesive [Tape] Rash   Fluorescein-Benoxinate Rash    FA dye (Fluress- eyes)     Valacyclovir Anxiety    Nervous, anxious and jittery.   Medications Prior to Admission  Medication Sig Dispense Refill   ferrous sulfate 325 (65 FE) MG tablet Take 1 tablet (325 mg total) by mouth 2 (two) times daily with a meal. 30 tablet 3   folic acid (FOLVITE) 400 MCG tablet Take 400 mcg by mouth daily.     glipiZIDE (GLUCOTROL XL) 2.5 MG 24 hr tablet Take 2.5 mg by mouth daily.     levothyroxine (SYNTHROID) 88 MCG tablet Take 88 mcg by mouth daily.     traMADol (ULTRAM) 50 MG tablet Take 50 mg by mouth every 6 (six) hours as needed for severe pain (pain score 7-10).     vitamin B-12 (CYANOCOBALAMIN) 100 MCG tablet Take 100 mcg by mouth daily.     Vitamin D, Ergocalciferol, (DRISDOL) 1.25 MG (50000 UNIT) CAPS capsule Take 50,000 Units by mouth every 7 (seven) days.     celecoxib (CELEBREX) 200 MG capsule Take 200 mg by mouth 2 (two) times daily. For 28 days. (Patient not taking: Reported on 03/01/2023)        Home: Home Living Family/patient expects to be discharged to:: Inpatient rehab Living Arrangements: Spouse/significant other Available Help at Discharge: Family Type of Home: House Home Access: Other (comment) Home Layout: Multi-level, Able to live on main level with bedroom/bathroom Alternate Level Stairs-Number of Steps: stair lift from basement to main level Bathroom Shower/Tub: Health visitor: Handicapped height Home Equipment: Information systems manager, Agricultural consultant (2 wheels), Grab bars - tub/shower   Functional History: Prior Function Prior Level of Function : Independent/Modified Independent Mobility Comments: Ambulatory in home without AD, furniture walks PRN, uses stair lift to access main level from basement. ADLs Comments: ind  Functional Status:  Mobility: Bed Mobility Overal bed mobility:  Needs Assistance Bed Mobility: Sit to Supine Supine to sit: Min assist Sit to supine: Min assist General bed mobility comments: light min A to return LEs to bed Transfers Overall transfer level: Needs assistance Equipment used: None, 1 person hand held assist Transfers: Sit to/from Stand, Bed to chair/wheelchair/BSC Sit to Stand: Min assist Bed to/from chair/wheelchair/BSC transfer type:: Step pivot Step pivot transfers: Min assist General transfer comment: light min A to boost up to stand with HHA from recliner x1 and EOB x2, min A to step pivot to EOB Ambulation/Gait Ambulation/Gait assistance: Min assist Gait Distance (Feet): 5 Feet Assistive device: 1 person hand held assist Gait Pattern/deviations: Step-through pattern, Decreased stride length, Narrow base of support General Gait Details: short gait 5' forward/back from/to bed to window with HHA on R, very short shuffling steps with narrow BOS, increased cues for safety due to pt vision impairment at baseline Gait velocity: decr Pre-gait activities: able to march in place x20 and take a few side steps toward HOB, bil knees flexed in stance    ADL: ADL Overall ADL's : Needs assistance/impaired Eating/Feeding: Set up Grooming: Set up, Sitting Upper Body Bathing: Minimal assistance, Sitting Lower Body Bathing:  Moderate assistance, Sitting/lateral leans Upper Body Dressing : Minimal assistance, Sitting Lower Body Dressing: Moderate assistance, Sitting/lateral leans Toilet Transfer: Minimal assistance, Moderate assistance Toileting- Clothing Manipulation and Hygiene: Maximal assistance Functional mobility during ADLs: Minimal assistance, Moderate assistance General ADL Comments: Issued and educated in use of insulated cup with lid and handle. Instructed to use handle on R and both hands when drinking hot beverages for safety  Cognition: Cognition Overall Cognitive Status: Within Functional Limits for tasks assessed Orientation  Level: Oriented X4 Cognition Arousal: Alert Behavior During Therapy: WFL for tasks assessed/performed Overall Cognitive Status: Within Functional Limits for tasks assessed General Comments: pt somewhat resistant to new learning  Physical Exam: Blood pressure 105/71, pulse 87, temperature (!) 97.5 F (36.4 C), temperature source Oral, resp. rate 17, height 6\' 2"  (1.88 m), weight 60.5 kg, SpO2 97%. Physical Exam Constitutional:      General: He is not in acute distress. HENT:     Head:     Comments: Surgical site with bolster dressing in place with peri-wound erythema.     Right Ear: External ear normal.     Left Ear: External ear normal.     Nose: Nose normal.  Cardiovascular:     Rate and Rhythm: Normal rate and regular rhythm.     Heart sounds: No murmur heard.    No gallop.  Pulmonary:     Effort: Pulmonary effort is normal. No respiratory distress.     Breath sounds: Normal breath sounds. No wheezing.  Abdominal:     General: Bowel sounds are normal. There is no distension.     Palpations: Abdomen is soft.  Genitourinary:    Comments: foley Musculoskeletal:        General: No swelling or tenderness. Normal range of motion.     Cervical back: Normal range of motion.     Comments: Early signs of pressure injury to both heels L > R  Skin:    General: Skin is warm and dry.  Neurological:     Mental Status: He is alert and oriented to person, place, and time.     Comments: Alert and oriented x 3. Normal insight and awareness. Intact Memory. Normal language and speech. Cranial nerve exam unremarkable although pt is blind in both eyes, can see light from left eye only. MMT: RUE 4/5 prox to distal with grip 4-. LUE 4+ to 5/5. LE's 4/5 prox to 4+/5 distal. Sensory exam normal for light touch and pain in all 4 limbs. No limb ataxia or cerebellar signs. No abnormal tone appreciated.     Psychiatric:        Mood and Affect: Mood normal.        Behavior: Behavior normal.      Results for orders placed or performed during the hospital encounter of 03/01/23 (from the past 48 hour(s))  Glucose, capillary     Status: Abnormal   Collection Time: 03/05/23  5:12 PM  Result Value Ref Range   Glucose-Capillary 117 (H) 70 - 99 mg/dL    Comment: Glucose reference range applies only to samples taken after fasting for at least 8 hours.  Glucose, capillary     Status: Abnormal   Collection Time: 03/05/23  9:29 PM  Result Value Ref Range   Glucose-Capillary 107 (H) 70 - 99 mg/dL    Comment: Glucose reference range applies only to samples taken after fasting for at least 8 hours.  Glucose, capillary     Status: Abnormal   Collection Time:  03/06/23  6:03 AM  Result Value Ref Range   Glucose-Capillary 105 (H) 70 - 99 mg/dL    Comment: Glucose reference range applies only to samples taken after fasting for at least 8 hours.  Glucose, capillary     Status: Abnormal   Collection Time: 03/06/23 12:32 PM  Result Value Ref Range   Glucose-Capillary 101 (H) 70 - 99 mg/dL    Comment: Glucose reference range applies only to samples taken after fasting for at least 8 hours.  Glucose, capillary     Status: Abnormal   Collection Time: 03/06/23  4:36 PM  Result Value Ref Range   Glucose-Capillary 129 (H) 70 - 99 mg/dL    Comment: Glucose reference range applies only to samples taken after fasting for at least 8 hours.  Glucose, capillary     Status: Abnormal   Collection Time: 03/06/23  9:21 PM  Result Value Ref Range   Glucose-Capillary 111 (H) 70 - 99 mg/dL    Comment: Glucose reference range applies only to samples taken after fasting for at least 8 hours.   Comment 1 Notify RN   Glucose, capillary     Status: Abnormal   Collection Time: 03/07/23  6:19 AM  Result Value Ref Range   Glucose-Capillary 112 (H) 70 - 99 mg/dL    Comment: Glucose reference range applies only to samples taken after fasting for at least 8 hours.   Comment 1 Notify RN   Glucose, capillary      Status: Abnormal   Collection Time: 03/07/23 11:54 AM  Result Value Ref Range   Glucose-Capillary 178 (H) 70 - 99 mg/dL    Comment: Glucose reference range applies only to samples taken after fasting for at least 8 hours.   Comment 1 Notify RN    Comment 2 Document in Chart    No results found.    Blood pressure 105/71, pulse 87, temperature (!) 97.5 F (36.4 C), temperature source Oral, resp. rate 17, height 6\' 2"  (1.88 m), weight 60.5 kg, SpO2 97%.  Medical Problem List and Plan: 1. Functional deficits secondary to bilateral frontal and parietal lobe infarcts as well as cerebral amyloid angiopathy  -patient may shower  -ELOS/Goals: 9-11 days, supervision goals  2.  Antithrombotics: -DVT/anticoagulation:  Mechanical:  Antiembolism stockings, knee (TED hose) Bilateral lower extremities  -antiplatelet therapy: aspirin 81 mg daily. No AC due to cerebral amyloid angiopathy  3. Pain Management: Tylenol as needed  4. Mood/Behavior/Sleep: LCSW to evaluate and provide emotional support  -antipsychotic agents: n/a  5. Neuropsych/cognition: This patient is capable of making decisions on his own behalf.  6. Skin/Wound Care: Routine skin care checks  -foam borders to heels/Prevalon boots   7. Fluids/Electrolytes/Nutrition: Routine Is and Os and follow-up chemistries  8: Seizure-like activity: continue Keppra 500 mg BID  -follow-up GNA  9: Hyperlipidemia: continue statin  10: Bilateral blindness: can see light/dark through left eye only  11: Hypothyroidism: continue Synthroid  12: DM-2: A1C = 5.8% (home glipizide discontinued)  -dc SSI now and give Ensure with meals  -carb modified diet  13: CKD stage III: Baseline creatinine ~1.4; stable  -follow-up BMP  14: SSC scalp s/p resection: remove dressing 11/12>>recommendation to cut sutures of bolster and to remove bolster; synthetic matrix underlying should stay in place and be kept moist with application of Xeroform gauze and  petroleum jelly daily  -follow-up with Dr. Christoper Allegra (phone [phone] if questions re: dressing)  15: Sinus tachycardia with PACs; atrial fib ruled out  -  on metoprolol 25 mg BID  16: Anemia of chronic disease/? iron deficiency: continue oral iron  -follow-up CBC     Milinda Antis, PA-C 03/07/2023

## 2023-03-07 NOTE — Discharge Summary (Signed)
Physician Discharge Summary   Patient: Douglas Melton MRN: [id number] DOB: 18-Jun-1935  Admit date:     03/01/2023  Discharge date: 03/07/23  Discharge Physician: Jacquelin Hawking, MD   PCP: Barbie Banner, MD   Recommendations at discharge:  PCP visit for hospital follow-up Neurology visit for stroke follow-up  Discharge Diagnoses: Principal Problem:   Seizure-like activity [facility]) Active Problems:   PAC (premature atrial contraction)   Squamous cell carcinoma of scalp   CKD (chronic kidney disease), stage III (HCC)   Diabetes mellitus without complication (HCC)   Hypothyroidism   Blindness   FTT (failure to thrive) in adult   Protein-calorie malnutrition, severe   Acute embolic stroke [facility])  Resolved Problems:   Hypertension associated with diabetes [facility])  Hospital Course: Douglas Melton is a 87 y.o. 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  Patient presented secondary to seizure-like activity. Neurology consulted. Keppra started and EEG ordered, which did not capture evidence of seizure activity. MRI brain obtained showing multiple acute strokes, concerning for embolic etiology. MRI also significant for cerebral amyloid angiopathy; neurology recommended aspirin monotherapy and to defer cardioembolic workup as he will not be a candidate for anticoagulation.  Assessment and Plan:  Seizure-like activity Patient with right arm movements concerning for possible focal seizures. Neurology consulted and are not convinced this is related to recent craniectomy. Long-term EEG ordered and patient loaded on Keppra. EEG without evidence of seizure or epileptiform discharges. MRI obtained and are significant for multiple strokes. Neurology recommendations to continue Keppra. Continue Keppra on discharge.   Acute ischemic  CVA Multiple infarcts suggesting embolic pattern. MRI brain confirms numerous small acute infarcts in left greater than right frontal and parietal lobes with moderate/severe chronic small vessel ischemic disease and chronic microhemorrhages which may reflect cerebral amyloid angiopathy. In setting of newly diagnosed atrial fibrillation. LDL pending. Hemoglobin A1C pending. Transthoracic Echocardiogram significant for low-normal LVEF of 50-55% with grade 1 diastolic dysfunction; interatrial septum not well visualized. Neurology recommendations for aspirin monotherapy; cardioembolic workup deferred secondary to cerebral amyloid angiopathy. PT/OT recommendations for acute inpatient rehabilitation. Patient to follow-up outpatient with neurology. Continue Crestor and aspirin.   Sinus tachycardia with PACs Initial concern for possible atrial fibrillation. Rhythm on telemetry did not appear consistent with atrial fibrillation. EKGs obtained not suggestive of atrial fibrillation. Discussed with cardiology service who agree there is no evidence of atrial fibrillation. Continue metoprolol tartrate.   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 Recommendation for dressing removal on 11/12. Per discussion with outpatient neurosurgeon, Dr. Christoper Allegra, recommendation to cut sutures of bolster and to remove bolster; synthetic matrix underlying should stay in place and be kept moist with application of Xeroform gauze and petroleum jelly daily. Information relayed to inpatient rehab team including Dr. Anselmo Rod number for step-by-step walkthrough.   CKD stage IIIa Stable.   Diabetes mellitus type 2 Controlled with hemoglobin A1C of 5.8%. Glipizide held and discontinued on discharge. Resume per PCP discretion.   Hypothyroidism Continue Synthroid.   Blindness Noted.   Failure to thrive Severe  malnutrition Underweight Noted. Dietitian consulted. Estimated body mass index is 17.12 kg/m as calculated from the following:   Height as of this encounter: 6\' 2"  (1.88 m).   Weight as of this encounter: 60.5 kg.   Consultants:  Neurology   Procedures:  EEG Transthoracic Echocardiogram  Disposition: Acute inpatient rehabilitation Diet recommendation: Cardiac and Carb modified diet   DISCHARGE MEDICATION: Allergies as of 03/07/2023       Reactions   Adhesive [tape] Rash   Fluorescein-benoxinate Rash   FA dye (Fluress- eyes)   Valacyclovir Anxiety   Nervous, anxious and jittery.        Medication List     STOP taking these medications    celecoxib 200 MG capsule Commonly known as: CELEBREX   glipiZIDE 2.5 MG 24 hr tablet Commonly known as: GLUCOTROL XL       TAKE these medications    aspirin EC 81 MG tablet Take 1 tablet (81 mg total) by mouth daily. Swallow whole. Start taking on: March 08, 2023   feeding supplement Liqd Take 237 mLs by mouth 3 (three) times daily between meals.   ferrous sulfate 325 (65 FE) MG tablet Take 1 tablet (325 mg total) by mouth 2 (two) times daily with a meal.   folic acid 400 MCG tablet Commonly known as: FOLVITE Take 400 mcg by mouth daily.   levETIRAcetam 500 MG tablet Commonly known as: KEPPRA Take 1 tablet (500 mg total) by mouth 2 (two) times daily.   levothyroxine 88 MCG tablet Commonly known as: SYNTHROID Take 88 mcg by mouth daily.   metoprolol tartrate 25 MG tablet Commonly known as: LOPRESSOR Take 1 tablet (25 mg total) by mouth 2 (two) times daily.   rosuvastatin 10 MG tablet Commonly known as: CRESTOR Take 1 tablet (10 mg total) by mouth daily. Start taking on: March 08, 2023   traMADol 50 MG tablet Commonly known as: ULTRAM Take 50 mg by mouth every 6 (six) hours as needed for severe pain (pain score 7-10).   vitamin B-12 100 MCG tablet Commonly known as: CYANOCOBALAMIN Take 100 mcg  by mouth daily.   Vitamin D (Ergocalciferol) 1.25 MG (50000 UNIT) Caps capsule Commonly known as: DRISDOL Take 50,000 Units by mouth every 7 (seven) days.        Follow-up Information     [facility]. Schedule an appointment as soon as possible for a visit in 1 month(s).   Specialty: Neurology Why: stroke clinic Contact information: [address] [place] [address] [phone]               Discharge Exam: BP 105/71 (BP Location: Right Arm)   Pulse 87   Temp (!) 97.5 F (36.4 C) (Oral)   Resp 17   Ht 6\' 2"  (1.88 m)   Wt 60.5 kg   SpO2 97%   BMI 17.12 kg/m   General exam: Appears calm and comfortable Respiratory system: Clear to auscultation. Respiratory effort normal. Cardiovascular system: S1 & S2 heard, RRR. No murmurs. Gastrointestinal system: Abdomen is nondistended, soft and nontender.  Normal bowel sounds heard. Central nervous system: Alert and oriented. Musculoskeletal: No edema. No calf tenderness Skin: Scalp incision would with bolster dressing secured by sutures Psychiatry: Judgement and insight appear normal. Mood & affect appropriate.   Condition at discharge: stable  The results of significant diagnostics from this hospitalization (including imaging, microbiology, ancillary and laboratory) are listed below for reference.   Imaging Studies: CT ANGIO HEAD NECK W WO CM  Result Date: 03/05/2023 CLINICAL DATA:  87 year old male with seizures. Scattered bilateral small cerebral infarcts on MRI two days ago. Recently resected skin cancer of the scalp. EXAM: CT ANGIOGRAPHY HEAD AND NECK WITH AND  WITHOUT CONTRAST TECHNIQUE: Multidetector CT imaging of the head and neck was performed using the standard protocol during bolus administration of intravenous contrast. Multiplanar CT image reconstructions and MIPs were obtained to evaluate the vascular anatomy. Carotid stenosis measurements (when  applicable) are obtained utilizing NASCET criteria, using the distal internal carotid diameter as the denominator. RADIATION DOSE REDUCTION: This exam was performed according to the departmental dose-optimization program which includes automated exposure control, adjustment of the mA and/or kV according to patient size and/or use of iterative reconstruction technique. CONTRAST:  75mL OMNIPAQUE IOHEXOL 350 MG/ML SOLN COMPARISON:  MRI 03/03/2023.  Head and cervical spine CT 03/01/2023. FINDINGS: CT HEAD Brain: No acute intracranial hemorrhage identified. No midline shift, mass effect, or evidence of intracranial mass lesion. Scattered small cerebral hemisphere cortical and subcortical white matter infarcts on MRI yesterday remain largely occult by CT. Subcortical T2 and FLAIR hyperintensity in the left perirolandic area was partially affected, series 4, image 23 now. No new cortically based infarct identified. Calvarium and skull base: Stable abnormal calvarium at the vertex. Paranasal sinuses: Visualized paranasal sinuses and mastoids are stable and well aerated. Orbits: Stable abnormal scalp vertex. CTA NECK Skeleton: Carious dentition. Unchanged abnormal skull vertex as stated above. No new osseous abnormality identified. Upper chest: Mild apical lung scarring. Negative visible mediastinum. Other neck: Negative. Aortic arch: Tortuous aortic arch with calcified atherosclerosis. Three vessel arch configuration. Right carotid system: Mild brachiocephalic artery and right CCA origin tortuosity with no significant plaque or stenosis. Mild to moderate soft and calcified plaque at the right ICA origin and bulb, and additional plaque distal to the bulb on series 13, image 144 with subsequent 60-65 % stenosis with respect to the distal vessel. The right ICA remains patent to the skull base. Left carotid system: Mild to moderate calcified plaque at the left ICA origin. Mild combined soft and calcified plaque at the distal  left ICA bulb. No significant stenosis. Vertebral arteries: Proximal right subclavian artery calcified plaque without stenosis. Normal right vertebral artery origin. Right V1 segment plaque without stenosis. Mildly dominant right vertebral artery appears patent to the skull base with no significant stenosis. Proximal left subclavian artery soft and calcified plaque without stenosis. Left vertebral artery origin calcified plaque with moderate to severe stenosis (series 14, image 74). The vessel remains patent, is non dominant and patent to the skull base without additional stenosis. CTA HEAD Posterior circulation: Distal vertebral arteries and vertebrobasilar junction are patent with no significant stenosis. Dominant right V4 segment. Both PICA origins are patent. Patent basilar artery without stenosis. Patent SCA and PCA origins. Posterior communicating arteries are diminutive or absent. Bilateral PCA branches are patent. There is moderate right P3 segment irregularity and stenosis on series 18, image 22. Anterior circulation: Both ICA siphons are patent. On the left supraclinoid calcified plaque results in mild to moderate stenosis. On the right similar anterior genu and supraclinoid calcified plaque, up to moderate distal right ICA stenosis. Carotid termini, MCA and ACA origins are normal. Anterior communicating artery is normal. Short segment moderate to severe stenosis of the left ACA A2 versus a 3 best seen on series 17, image 19 with distal enhancement maintained. Left MCA M1 segment bifurcates early without stenosis. Right MCA M1 segment bifurcates without stenosis. Bilateral MCA branches are within normal limits. Venous sinuses: Superior sagittal sinus remains patent. Other major venous sinuses appear patent. Anatomic variants: Dominant right vertebral artery. Review of the MIP images confirms the above findings IMPRESSION: 1. Negative for large vessel occlusion.  2. Positive for intracranial atherosclerosis  with significant stenoses: - up to moderate stenosis of both supraclinoid ICAs. - moderate stenosis right ACA A2/A3. - moderate stenosis right PCA P3. 3. Significant extracranial atherosclerosis and stenosis: - Severe stenosis left vertebral artery origin. - 60-65% stenosis right ICA distal to the bulb.-moderate to severe stenosis of the Left Vertebral Artery origin. 4. No acute intracranial hemorrhage or mass effect. Scattered small cerebral hemisphere infarcts on MRI yesterday remain largely occult by CT. 5. Stable abnormal skull vertex and scalp. 6.  Aortic Atherosclerosis (ICD10-I70.0). Electronically Signed   By: [name] M.D.   On: 03/05/2023 12:35   MR BRAIN W WO CONTRAST  Addendum Date: 03/03/2023   ADDENDUM REPORT: 03/03/2023 19:15 ADDENDUM: These results will be called to the ordering clinician or representative by the Radiologist Assistant, and communication documented in the PACS or [employer]. Electronically Signed   By: Sebastian Ache M.D.   On: 03/03/2023 19:15   Result Date: 03/03/2023 CLINICAL DATA:  New onset seizures. Recent resection of squamous cell carcinoma of the scalp. EXAM: MRI HEAD WITHOUT AND WITH CONTRAST TECHNIQUE: Multiplanar, multiecho pulse sequences of the brain and surrounding structures were obtained without and with intravenous contrast. CONTRAST:  6mL GADAVIST GADOBUTROL 1 MMOL/ML IV SOLN COMPARISON:  Head CT 03/01/2023 FINDINGS: Brain: Numerous small foci of predominantly cortically based restricted diffusion are present in the left greater than right frontal and parietal lobes, greatest towards the vertex. There is no abnormal brain parenchymal or meningeal enhancement, and these are most suggestive of acute infarcts. Patchy and confluent T2 hyperintensities in the subcortical and deep cerebral white matter bilaterally are nonspecific but compatible with moderate to severe chronic small vessel ischemic disease. There is mild-to-moderate cerebral atrophy. Innumerable  chronic microhemorrhages are present peripherally throughout both cerebral hemispheres (greatest posteriorly, particularly in the right occipitotemporal region) and in the cerebellum. The hippocampi are symmetric in size without a focal signal abnormality identified. There is no midline shift or extra-axial fluid collection. Vascular: Major intracranial vascular flow voids are preserved. Skull and upper cervical spine: Postoperative changes to the frontal vertex scalp with underlying mild mild patchy T1 hypointensity and enhancement of the underlying frontal skull and with mild outer table erosion shown on CT. Presumed overlying dressing. Sinuses/Orbits: Bilateral cataract extraction. Trace right mastoid fluid. Minimal mucosal thickening in the ethmoid sinuses. Other: None. IMPRESSION: 1. Numerous small acute infarcts in the left greater than right frontal and parietal lobes. 2. Moderate to severe chronic small vessel ischemic disease. 3. Innumerable chronic microhemorrhages which may reflect cerebral amyloid angiopathy. 4. Postoperative changes to the frontal vertex scalp with underlying mild calvarial marrow signal abnormality and mild outer table erosion on CT. Marrow findings could be postoperative or secondary to tumor. Correlate with recent surgery and pathology. Electronically Signed: By: Sebastian Ache M.D. On: 03/03/2023 19:13   ECHOCARDIOGRAM COMPLETE  Result Date: 03/02/2023    ECHOCARDIOGRAM REPORT   Patient Name:   POWER ARNET Date of Exam: 03/02/2023 Medical Rec #:  [id number]       Height:       74.0 in Accession #:    [id number]      Weight:       150.0 lb Date of Birth:  December 13, 1935       BSA:          1.923 m Patient Age:    87 years        BP:  145/65 mmHg Patient Gender: M               HR:           85 bpm. Exam Location:  Inpatient Procedure: 2D Echo, Cardiac Doppler and Color Doppler Indications:    Atrial Fibrillation  History:        Patient has no prior history of Echocardiogram  examinations.                 Risk Factors:Diabetes and Former Smoker.  Sonographer:    Karma Ganja Referring Phys: [id number] Orland Mustard  Sonographer Comments: Technically challenging study due to limited acoustic windows and suboptimal subcostal window. Image acquisition challenging due to patient body habitus. IMPRESSIONS  1. Left ventricular ejection fraction, by estimation, is 50 to 55%. The left ventricle has low normal function. Left ventricular endocardial border not optimally defined to evaluate regional wall motion. There is mild left ventricular hypertrophy. Left ventricular diastolic parameters are consistent with Grade I diastolic dysfunction (impaired relaxation).  2. Right ventricular systolic function is normal. The right ventricular size is normal. Tricuspid regurgitation signal is inadequate for assessing PA pressure.  3. The mitral valve is degenerative. No evidence of mitral valve regurgitation. No evidence of mitral stenosis. Severe mitral annular calcification.  4. The aortic valve was not well visualized. Aortic valve regurgitation is not visualized. No aortic stenosis is present. FINDINGS  Left Ventricle: Left ventricular ejection fraction, by estimation, is 50 to 55%. The left ventricle has low normal function. Left ventricular endocardial border not optimally defined to evaluate regional wall motion. The left ventricular internal cavity  size was normal in size. There is mild left ventricular hypertrophy. Left ventricular diastolic parameters are consistent with Grade I diastolic dysfunction (impaired relaxation). Right Ventricle: The right ventricular size is normal. Right vetricular wall thickness was not well visualized. Right ventricular systolic function is normal. Tricuspid regurgitation signal is inadequate for assessing PA pressure. Left Atrium: Left atrial size was normal in size. Right Atrium: Right atrial size was normal in size. Pericardium: There is no evidence of pericardial  effusion. Mitral Valve: The mitral valve is degenerative in appearance. Severe mitral annular calcification. No evidence of mitral valve regurgitation. No evidence of mitral valve stenosis. Tricuspid Valve: The tricuspid valve is normal in structure. Tricuspid valve regurgitation is trivial. Aortic Valve: The aortic valve was not well visualized. Aortic valve regurgitation is not visualized. No aortic stenosis is present. Aortic valve mean gradient measures 1.7 mmHg. Aortic valve peak gradient measures 3.0 mmHg. Aortic valve area, by VTI measures 3.03 cm. Pulmonic Valve: The pulmonic valve was not well visualized. Pulmonic valve regurgitation is not visualized. Aorta: The aortic root is normal in size and structure. IAS/Shunts: The interatrial septum was not well visualized.  LEFT VENTRICLE PLAX 2D LVIDd:         4.10 cm   Diastology LVIDs:         3.00 cm   LV e' medial:    4.57 cm/s LV PW:         1.10 cm   LV E/e' medial:  14.1 LV IVS:        1.10 cm   LV e' lateral:   6.09 cm/s LVOT diam:     2.00 cm   LV E/e' lateral: 10.5 LV SV:         45 LV SV Index:   23 LVOT Area:     3.14 cm  RIGHT VENTRICLE  IVC RV Basal diam:  2.90 cm     IVC diam: 0.90 cm RV S prime:     12.97 cm/s TAPSE (M-mode): 2.0 cm LEFT ATRIUM             Index        RIGHT ATRIUM           Index LA diam:        4.00 cm 2.08 cm/m   RA Area:     12.20 cm LA Vol (A2C):   44.1 ml 22.93 ml/m  RA Volume:   28.30 ml  14.71 ml/m LA Vol (A4C):   29.1 ml 15.13 ml/m LA Biplane Vol: 37.9 ml 19.71 ml/m  AORTIC VALVE AV Area (Vmax):    2.62 cm AV Area (Vmean):   2.62 cm AV Area (VTI):     3.03 cm AV Vmax:           86.57 cm/s AV Vmean:          60.433 cm/s AV VTI:            0.148 m AV Peak Grad:      3.0 mmHg AV Mean Grad:      1.7 mmHg LVOT Vmax:         72.23 cm/s LVOT Vmean:        50.367 cm/s LVOT VTI:          0.143 m LVOT/AV VTI ratio: 0.96  AORTA Ao Root diam: 2.55 cm MITRAL VALVE MV Area (PHT): 4.64 cm    SHUNTS MV Decel Time:  164 msec    Systemic VTI:  0.14 m MV E velocity: 64.26 cm/s  Systemic Diam: 2.00 cm MV A velocity: 95.70 cm/s MV E/A ratio:  0.67 Epifanio Lesches MD Electronically signed by Epifanio Lesches MD Signature Date/Time: 03/02/2023/12:17:31 PM    Final    Overnight EEG with video  Result Date: 03/02/2023 Charlsie Quest, MD     03/03/2023 10:15 AM Patient Name: Hannah Westland MRN: [id number] Epilepsy Attending: Charlsie Quest Referring Physician/Provider: Marjorie Smolder, NP Duration: 03/01/2023 1715 to 03/02/2023 1715 Patient history: 87 y.o. male patient noticed seizure-like activity with painful twitching and shaking of his right arm which lasted about 5 or 6 minutes. Patient was unable to move his arm after this episode. He called EMS and was brought to the hospital, and episodes recurred 3 times. EEG to evaluate for seizure Level of alertness: Awake, asleep AEDs during EEG study: LEV Technical aspects: This EEG study was done with scalp electrodes positioned according to the 10-20 International system of electrode placement. Electrical activity was reviewed with band pass filter of 1-70Hz , sensitivity of 7 uV/mm, display speed of 67mm/sec with a 60Hz  notched filter applied as appropriate. EEG data were recorded continuously and digitally stored.  Video monitoring was available and reviewed as appropriate. Description: The posterior dominant rhythm consists of 7.5 Hz activity of moderate voltage (25-35 uV) seen predominantly in posterior head regions, symmetric and reactive to eye opening and eye closing. Sleep was characterized by vertex waves, sleep spindles (12 to 14 Hz), maximal frontocentral region. EEG showed intermittent generalized 3-7 Hz theta- delta slowing. Hyperventilation and photic stimulation were not performed.   ABNORMALITY -Intermittent slow, generalized IMPRESSION: This study is suggestive of mild diffuse encephalopathy. No seizures or epileptiform discharges were seen throughout  the recording. Please note lack of epileptiform activity during interictal EEG does not exclude the diagnosis of epilepsy. Priyanka Annabelle Harman  CT Cervical Spine Wo Contrast  Result Date: 03/01/2023 CLINICAL DATA:  87 year old male with seizure. "Yesterday removed tumor from lining of skull". EXAM: CT CERVICAL SPINE WITHOUT CONTRAST TECHNIQUE: Multidetector CT imaging of the cervical spine was performed without intravenous contrast. Multiplanar CT image reconstructions were also generated. RADIATION DOSE REDUCTION: This exam was performed according to the departmental dose-optimization program which includes automated exposure control, adjustment of the mA and/or kV according to patient size and/or use of iterative reconstruction technique. COMPARISON:  Head CT today. FINDINGS: Alignment: Normal cervical lordosis. Cervicothoracic junction alignment is within normal limits. Bilateral posterior element alignment is within normal limits. Skull base and vertebrae: Visualized skull base is intact. No atlanto-occipital dissociation. C1 and C2 appear intact and aligned. No acute or suspicious osseous lesion identified in the cervical spine. Soft tissues and spinal canal: No prevertebral fluid or swelling. No visible canal hematoma. Calcified cervical carotid atherosclerosis. Otherwise negative visible noncontrast neck soft tissues. Disc levels: Mild for age cervical spine degeneration. No CT evidence of spinal stenosis. Upper chest: Visible upper thoracic levels appear grossly intact. Mild apical lung scarring. IMPRESSION: 1. No acute or suspicious osseous abnormality identified in the cervical spine. 2. Mild for age cervical spine degeneration. 3. Calcified cervical carotid atherosclerosis. Electronically Signed   By: [name] M.D.   On: 03/01/2023 10:45   CT Head Wo Contrast  Result Date: 03/01/2023 CLINICAL DATA:  87 year old male with seizure. "Yesterday removed tumor from lining of skull". EXAM: CT HEAD WITHOUT  CONTRAST TECHNIQUE: Contiguous axial images were obtained from the base of the skull through the vertex without intravenous contrast. RADIATION DOSE REDUCTION: This exam was performed according to the departmental dose-optimization program which includes automated exposure control, adjustment of the mA and/or kV according to patient size and/or use of iterative reconstruction technique. COMPARISON:  None Available. FINDINGS: Brain: Cerebral volume loss appears to be generalized. Dural calcifications, including symmetric tentorium involvement. No midline shift, mass effect, or evidence of intracranial mass lesion. No ventriculomegaly. No acute intracranial hemorrhage identified. Patchy and scattered bilateral cerebral white matter hypodensity with some deep white matter capsule involvement. Deep gray nuclei and posterior fossa relatively spared. No vasogenic edema identified. No acute or chronic cortically based infarct identified. Vascular: No suspicious intracranial vascular hyperdensity. Calcified atherosclerosis at the skull base. Skull: Abnormally eroded anterior vertex, left greater than right superior frontal bones further detailed below. No craniotomy. Background bone mineralization is within normal limits, or mild skull base osteopenia. No other No acute osseous abnormality identified. Sinuses/Orbits: Visualized paranasal sinuses and mastoids are clear. Other: There is an anterior vertex exophytic, superficial mass which is inseparable from the scalp, demonstrates abnormal mixed density, and seems to be associated with erosion of the skull outer table on series 4, image 65. This bone erosion is more pronounced to the left of midline. Negative visible orbits soft tissues aside from postoperative changes. Calcified scalp vessel atherosclerosis. IMPRESSION: 1. Partially eroded outer table of the skull at the anterior vertex with overlying exophytic mixed density mass which might be postoperative dressing  material (uncertain). No craniotomy. No skull fracture or full-thickness calvarium erosion. 2. No acute intracranial abnormality. Generalized cerebral volume loss and moderate for age white matter disease. Electronically Signed   By: [name] M.D.   On: 03/01/2023 10:43    Microbiology: Results for orders placed or performed during the hospital encounter of 02/14/20  Respiratory Panel by RT PCR (Flu A&B, Covid) - Nasopharyngeal Swab     Status: None  Collection Time: 02/14/20  4:32 PM   Specimen: Nasopharyngeal Swab  Result Value Ref Range Status   SARS Coronavirus 2 by RT PCR NEGATIVE NEGATIVE Final    Comment: (NOTE) SARS-CoV-2 target nucleic acids are NOT DETECTED.  The SARS-CoV-2 RNA is generally detectable in upper respiratoy specimens during the acute phase of infection. The lowest concentration of SARS-CoV-2 viral copies this assay can detect is 131 copies/mL. A negative result does not preclude SARS-Cov-2 infection and should not be used as the sole basis for treatment or other patient management decisions. A negative result may occur with  improper specimen collection/handling, submission of specimen other than nasopharyngeal swab, presence of viral mutation(s) within the areas targeted by this assay, and inadequate number of viral copies (<131 copies/mL). A negative result must be combined with clinical observations, patient history, and epidemiological information. The expected result is Negative.  Fact Sheet for Patients:  https://www.moore.com/  Fact Sheet for Healthcare Providers:  https://www.young.biz/  This test is no t yet approved or cleared by the [place] FDA and  has been authorized for detection and/or diagnosis of SARS-CoV-2 by FDA under an Emergency Use Authorization (EUA). This EUA will remain  in effect (meaning this test can be used) for the duration of the COVID-19 declaration under Section 564(b)(1) of the  Act, 21 U.S.C. section 360bbb-3(b)(1), unless the authorization is terminated or revoked sooner.     Influenza A by PCR NEGATIVE NEGATIVE Final   Influenza B by PCR NEGATIVE NEGATIVE Final    Comment: (NOTE) The Xpert Xpress SARS-CoV-2/FLU/RSV assay is intended as an aid in  the diagnosis of influenza from Nasopharyngeal swab specimens and  should not be used as a sole basis for treatment. Nasal washings and  aspirates are unacceptable for Xpert Xpress SARS-CoV-2/FLU/RSV  testing.  Fact Sheet for Patients: https://www.moore.com/  Fact Sheet for Healthcare Providers: https://www.young.biz/  This test is not yet approved or cleared by the [place] FDA and  has been authorized for detection and/or diagnosis of SARS-CoV-2 by  FDA under an Emergency Use Authorization (EUA). This EUA will remain  in effect (meaning this test can be used) for the duration of the  Covid-19 declaration under Section 564(b)(1) of the Act, 21  U.S.C. section 360bbb-3(b)(1), unless the authorization is  terminated or revoked. Performed at [facility], [address] [place], [place] [address]   Respiratory Panel by RT PCR (Flu A&B, Covid) - Nasopharyngeal Swab     Status: None   Collection Time: 02/20/20 12:01 PM   Specimen: Nasopharyngeal Swab  Result Value Ref Range Status   SARS Coronavirus 2 by RT PCR NEGATIVE NEGATIVE Final    Comment: (NOTE) SARS-CoV-2 target nucleic acids are NOT DETECTED.  The SARS-CoV-2 RNA is generally detectable in upper respiratoy specimens during the acute phase of infection. The lowest concentration of SARS-CoV-2 viral copies this assay can detect is 131 copies/mL. A negative result does not preclude SARS-Cov-2 infection and should not be used as the sole basis for treatment or other patient management decisions. A negative result may occur with  improper specimen collection/handling, submission of specimen other than  nasopharyngeal swab, presence of viral mutation(s) within the areas targeted by this assay, and inadequate number of viral copies (<131 copies/mL). A negative result must be combined with clinical observations, patient history, and epidemiological information. The expected result is Negative.  Fact Sheet for Patients:  https://www.moore.com/  Fact Sheet for Healthcare Providers:  https://www.young.biz/  This test is no t  yet approved or cleared by the [place] and  has been authorized for detection and/or diagnosis of SARS-CoV-2 by FDA under an Emergency Use Authorization (EUA). This EUA will remain  in effect (meaning this test can be used) for the duration of the COVID-19 declaration under Section 564(b)(1) of the Act, 21 U.S.C. section 360bbb-3(b)(1), unless the authorization is terminated or revoked sooner.     Influenza A by PCR NEGATIVE NEGATIVE Final   Influenza B by PCR NEGATIVE NEGATIVE Final    Comment: (NOTE) The Xpert Xpress SARS-CoV-2/FLU/RSV assay is intended as an aid in  the diagnosis of influenza from Nasopharyngeal swab specimens and  should not be used as a sole basis for treatment. Nasal washings and  aspirates are unacceptable for Xpert Xpress SARS-CoV-2/FLU/RSV  testing.  Fact Sheet for Patients: https://www.moore.com/  Fact Sheet for Healthcare Providers: https://www.young.biz/  This test is not yet approved or cleared by the [place] FDA and  has been authorized for detection and/or diagnosis of SARS-CoV-2 by  FDA under an Emergency Use Authorization (EUA). This EUA will remain  in effect (meaning this test can be used) for the duration of the  Covid-19 declaration under Section 564(b)(1) of the Act, 21  U.S.C. section 360bbb-3(b)(1), unless the authorization is  terminated or revoked. Performed at [facility], [address], [place],  [place] [address]     Labs: CBC: Recent Labs  Lab 03/01/23 0930 03/02/23 0440 03/05/23 0439  WBC 11.1* 10.7* 10.8*  HGB 12.9* 12.0* 12.4*  HCT 40.2 37.6* 37.1*  MCV 99.3 99.2 96.6  PLT 363 309 309   Basic Metabolic Panel: Recent Labs  Lab 03/01/23 0930 03/01/23 1839 03/02/23 0440 03/05/23 0439  NA 139  --  139 139  K 4.2  --  3.7 3.9  CL 106  --  105 104  CO2 22  --  24 27  GLUCOSE 170*  --  82 114*  BUN 15  --  16 17  CREATININE 1.56*  --  1.49* 1.30*  CALCIUM 9.0  --  8.6* 9.2  MG  --  1.9  --   --    Liver Function Tests: Recent Labs  Lab 03/01/23 0930  AST 17  ALT 9  ALKPHOS 83  BILITOT 0.9  PROT 6.2*  ALBUMIN 3.1*   CBG: Recent Labs  Lab 03/06/23 1232 03/06/23 1636 03/06/23 2121 03/07/23 0619 03/07/23 1154  GLUCAP 101* 129* 111* 112* 178*    Discharge time spent: 35 minutes.  Signed: Jacquelin Hawking, MD [facility] 03/07/2023

## 2023-03-07 NOTE — TOC Transition Note (Signed)
Transition of Care [facility]) - CM/SW Discharge Note   Patient Details  Name: Douglas Melton MRN: [id number] Date of Birth: 1936/01/29  [facility] CM/SW Contact:  Kermit Balo, RN Phone Number: 03/07/2023, 12:20 PM   Clinical Narrative:     Pt is discharging to CIR today. CM signing off.    Final next level of care: IP Rehab Facility Barriers to Discharge: No Barriers Identified   Patient Goals and CMS Choice CMS Medicare.gov Compare Post Acute Care list provided to:: Patient Choice offered to / list presented to : Patient, Spouse  Discharge Placement                         Discharge Plan and Services Additional resources added to the After Visit Summary for       Post Acute Care Choice: IP Rehab                               Social Determinants of Health (SDOH) Interventions SDOH Screenings   Food Insecurity: No Food Insecurity (03/03/2023)  Housing: Low Risk  (03/03/2023)  Transportation Needs: No Transportation Needs (03/03/2023)  Utilities: Not At Risk (03/03/2023)  Tobacco Use: Medium Risk (03/01/2023)     Readmission Risk Interventions     No data to display

## 2023-03-07 NOTE — Progress Notes (Signed)
Physical Therapy Treatment Patient Details Name: Douglas Melton MRN: [id number] DOB: 05-28-1935 Today's Date: 03/07/2023   History of Present Illness Pt is an 87 y/o M admitted on 03/01/23 after presenting with c/o sudden onset jerking of RUE concerning for focal seizures. 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 Pt continues to be preoccupied with lines/leads needing reassurance during mobility. Pt requiring min A for bed mobility, transfers and short gait in room with HHA. Pt with noted knee flexion in standing with low foot clearance during gait, needing cues for knee extension, upright posture and increased stride length/amplitude. Pt continues to be limited by fatigue, weakness, decreased activity tolerance and impaired balance/postural reactions. Current plan remains appropriate to address deficits and maximize functional independence and decrease caregiver burden. Pt continues to benefit from skilled PT services to progress toward functional mobility goals.     If plan is discharge home, recommend the following: A lot of help with walking and/or transfers;A lot of help with bathing/dressing/bathroom;Assist for transportation;Help with stairs or ramp for entrance;Assistance with cooking/housework   Can travel by private vehicle        Equipment Recommendations  None recommended by PT    Recommendations for Other Services       Precautions / Restrictions Precautions Precautions: Fall Precaution Comments: blind Restrictions Weight  Bearing Restrictions: No     Mobility  Bed Mobility Overal bed mobility: Needs Assistance Bed Mobility: Supine to Sit     Supine to sit: Min assist     General bed mobility comments: min A to elevate trunk and scoot out to EOB    Transfers Overall transfer level: Needs assistance Equipment used: None, 1 person hand held assist Transfers: Sit to/from Stand, Bed to chair/wheelchair/BSC Sit to Stand: Min assist   Step pivot transfers: Min assist       General transfer comment: min A to rise and steady during step pivot to chair with HHA    Ambulation/Gait Ambulation/Gait assistance: Min assist Gait Distance (Feet): 10 Feet Assistive device: 1 person hand held assist Gait Pattern/deviations: Step-through pattern, Decreased stride length, Narrow base of support Gait velocity: decr     General Gait Details: HHA with min A to steady, knees flexed throughout with distance limited by fatigue   Stairs             Wheelchair Mobility     Tilt Bed    Modified Rankin (Stroke Patients Only)       Balance Overall balance assessment: Needs assistance Sitting-balance support: Feet supported Sitting balance-Leahy Scale: Good Sitting balance - Comments: supervision static sitting EOB   Standing balance support: Bilateral upper extremity supported Standing balance-Leahy Scale: Poor Standing balance comment: ue support during dynamic tasks, able to maintain static standing without UE support                            Cognition Arousal: Alert Behavior During Therapy: WFL for tasks assessed/performed Overall Cognitive Status: Within Functional Limits for tasks assessed  Exercises      General Comments General comments (skin integrity, edema, etc.): VSS, spouse present and supportive      Pertinent Vitals/Pain Pain Assessment Pain Assessment: No/denies pain    Home Living                           Prior Function            PT Goals (current goals can now be found in the care plan section) Acute Rehab PT Goals PT Goal Formulation: With patient Time For Goal Achievement: 03/16/23 Progress towards PT goals: Progressing toward goals    Frequency    Min 1X/week      PT Plan      Co-evaluation              AM-PAC PT "6 Clicks" Mobility   Outcome Measure  Help needed turning from your back to your side while in a flat bed without using bedrails?: A Little Help needed moving from lying on your back to sitting on the side of a flat bed without using bedrails?: A Little Help needed moving to and from a bed to a chair (including a wheelchair)?: A Little Help needed standing up from a chair using your arms (e.g., wheelchair or bedside chair)?: A Little Help needed to walk in hospital room?: A Lot Help needed climbing 3-5 steps with a railing? : Total 6 Click Score: 15    End of Session Equipment Utilized During Treatment: Gait belt Activity Tolerance: Patient limited by fatigue Patient left: in chair;with call bell/phone within reach;with family/visitor present Nurse Communication: Mobility status PT Visit Diagnosis: Muscle weakness (generalized) (M62.81);Difficulty in walking, not elsewhere classified (R26.2);Other abnormalities of gait and mobility (R26.89);Unsteadiness on feet (R26.81)     Time: 1040-1100 PT Time Calculation (min) (ACUTE ONLY): 20 min  Charges:    $Therapeutic Activity: 8-22 mins PT General Charges $$ ACUTE PT VISIT: 1 Visit                     Douglas Zwiebel R. PTA Acute Rehabilitation Services Office: [phone]   Catalina Antigua 03/07/2023, 2:27 PM

## 2023-03-08 DIAGNOSIS — I680 Cerebral amyloid angiopathy: Secondary | ICD-10-CM

## 2023-03-08 DIAGNOSIS — H409 Unspecified glaucoma: Secondary | ICD-10-CM | POA: Diagnosis not present

## 2023-03-08 DIAGNOSIS — D044 Carcinoma in situ of skin of scalp and neck: Secondary | ICD-10-CM

## 2023-03-08 DIAGNOSIS — I6340 Cerebral infarction due to embolism of unspecified cerebral artery: Secondary | ICD-10-CM | POA: Diagnosis not present

## 2023-03-08 LAB — CBC WITH DIFFERENTIAL/PLATELET
Abs Immature Granulocytes: 0.05 10*3/uL (ref 0.00–0.07)
Basophils Absolute: 0.1 10*3/uL (ref 0.0–0.1)
Basophils Relative: 1 %
Eosinophils Absolute: 0.3 10*3/uL (ref 0.0–0.5)
Eosinophils Relative: 2 %
HCT: 35.8 % — ABNORMAL LOW (ref 39.0–52.0)
Hemoglobin: 12 g/dL — ABNORMAL LOW (ref 13.0–17.0)
Immature Granulocytes: 0 %
Lymphocytes Relative: 26 %
Lymphs Abs: 3.1 10*3/uL (ref 0.7–4.0)
MCH: 32.2 pg (ref 26.0–34.0)
MCHC: 33.5 g/dL (ref 30.0–36.0)
MCV: 96 fL (ref 80.0–100.0)
Monocytes Absolute: 1.5 10*3/uL — ABNORMAL HIGH (ref 0.1–1.0)
Monocytes Relative: 12 %
Neutro Abs: 7.1 10*3/uL (ref 1.7–7.7)
Neutrophils Relative %: 59 %
Platelets: 378 10*3/uL (ref 150–400)
RBC: 3.73 MIL/uL — ABNORMAL LOW (ref 4.22–5.81)
RDW: 14.3 % (ref 11.5–15.5)
WBC: 12.1 10*3/uL — ABNORMAL HIGH (ref 4.0–10.5)
nRBC: 0 % (ref 0.0–0.2)

## 2023-03-08 LAB — COMPREHENSIVE METABOLIC PANEL
ALT: 13 U/L (ref 0–44)
AST: 25 U/L (ref 15–41)
Albumin: 2.5 g/dL — ABNORMAL LOW (ref 3.5–5.0)
Alkaline Phosphatase: 65 U/L (ref 38–126)
Anion gap: 7 (ref 5–15)
BUN: 37 mg/dL — ABNORMAL HIGH (ref 8–23)
CO2: 27 mmol/L (ref 22–32)
Calcium: 8.8 mg/dL — ABNORMAL LOW (ref 8.9–10.3)
Chloride: 105 mmol/L (ref 98–111)
Creatinine, Ser: 1.81 mg/dL — ABNORMAL HIGH (ref 0.61–1.24)
GFR, Estimated: 36 mL/min — ABNORMAL LOW (ref >60)
Glucose, Bld: 131 mg/dL — ABNORMAL HIGH (ref 70–99)
Potassium: 4.4 mmol/L (ref 3.5–5.1)
Sodium: 139 mmol/L (ref 135–145)
Total Bilirubin: 0.9 mg/dL (ref <1.2)
Total Protein: 5.9 g/dL — ABNORMAL LOW (ref 6.5–8.1)

## 2023-03-08 LAB — GLUCOSE, CAPILLARY
Glucose-Capillary: 162 mg/dL — ABNORMAL HIGH (ref 70–99)
Glucose-Capillary: 161 mg/dL — ABNORMAL HIGH (ref 70–99)

## 2023-03-08 MED ORDER — SODIUM CHLORIDE 0.45 % IV BOLUS
1000.0000 mL | Freq: Once | INTRAVENOUS | Status: AC
  Administered 2023-03-08: 1000 mL via INTRAVENOUS

## 2023-03-08 NOTE — Discharge Instructions (Addendum)
Inpatient Rehab Discharge Instructions  Douglas Melton Discharge date and time:  03/16/2023  Activities/Precautions/ Functional Status: Activity: no lifting, driving, or strenuous exercise until cleared by MD Diet: diabetic diet Wound Care:  continue Xeroform dressing and Vaseline daily Functional status:  ___ No restrictions     ___ Walk up steps independently _x__ 24/7 supervision/assistance   ___ Walk up steps with assistance ___ Intermittent supervision/assistance  ___ Bathe/dress independently ___ Walk with walker     ___ Bathe/dress with assistance ___ Walk Independently    ___ Shower independently ___ Walk with assistance    __x_ Shower with assistance _x__ No alcohol     ___ Return to work/school ________  Special Instructions: No driving, alcohol consumption or tobacco use.  Recommend checking fingerstick blood sugars four (4) times daily and record. Bring this information with you to follow-up appointment with PCP.  Recommend daily BP measurement in same arm and record time of day. Bring this information with you to follow-up appointment with PCP.   COMMUNITY REFERRALS UPON DISCHARGE:    Home Health:   PT     OT     ST                   Agency: [facility] Phone: [phone]    Medical Equipment/Items Ordered: Transport Chair                                                 Agency/Supplier: [employer] [phone]   STROKE/TIA DISCHARGE INSTRUCTIONS SMOKING Cigarette smoking nearly doubles your risk of having a stroke & is the single most alterable risk factor  If you smoke or have smoked in the last 12 months, you are advised to quit smoking for your health. Most of the excess cardiovascular risk related to smoking disappears within a year of stopping. Ask you doctor about anti-smoking medications [place] Quit Line: 1-800-QUIT NOW Free Smoking Cessation Classes [phone]  CHOLESTEROL Know your levels; limit fat & cholesterol in your diet  Lipid Panel     Component Value  Date/Time   CHOL 138 03/05/2023 0439   TRIG 88 03/05/2023 0439   HDL 31 (L) 03/05/2023 0439   CHOLHDL 4.5 03/05/2023 0439   VLDL 18 03/05/2023 0439   LDLCALC 89 03/05/2023 0439     Many patients benefit from treatment even if their cholesterol is at goal. Goal: Total Cholesterol (CHOL) less than 160 Goal:  Triglycerides (TRIG) less than 150 Goal:  HDL greater than 40 Goal:  LDL (LDLCALC) less than 100   BLOOD PRESSURE American Stroke Association blood pressure target is less that 120/80 mm/Hg  Your discharge blood pressure is:  BP: 117/64 Monitor your blood pressure Limit your salt and alcohol intake Many individuals will require more than one medication for high blood pressure  DIABETES (A1c is a blood sugar average for last 3 months) Goal HGBA1c is under 7% (HBGA1c is blood sugar average for last 3 months)  Diabetes: No known diagnosis of diabetes    Lab Results  Component Value Date   HGBA1C 5.3 03/05/2023    Your HGBA1c can be lowered with medications, healthy diet, and exercise. Check your blood sugar as directed by your physician Call your physician if you experience unexplained or low blood sugars.  PHYSICAL ACTIVITY/REHABILITATION Goal is 30 minutes at least 4 days per week  Activity: Increase  activity slowly, Therapies: Physical Therapy: Home Health and Occupational Therapy: Home Health Return to work: n/a Activity decreases your risk of heart attack and stroke and makes your heart stronger.  It helps control your weight and blood pressure; helps you relax and can improve your mood. Participate in a regular exercise program. Talk with your doctor about the best form of exercise for you (dancing, walking, swimming, cycling).  DIET/WEIGHT Goal is to maintain a healthy weight  Your discharge diet is:  Diet Order             Diet Carb Modified Fluid consistency: Thin; Room service appropriate? Yes  Diet effective now                  thin liquids Your height is:   Height: 6\' 2"  (188 cm) Your current weight is: Weight: 59.1 kg Your Body Mass Index (BMI) is:  BMI (Calculated): 16.72 Following the type of diet specifically designed for you will help prevent another stroke. Your goal weight range is:   Your goal Body Mass Index (BMI) is 19-24. Healthy food habits can help reduce 3 risk factors for stroke:  High cholesterol, hypertension, and excess weight.  RESOURCES Stroke/Support Group:  Call [phone]   STROKE EDUCATION PROVIDED/REVIEWED AND GIVEN TO PATIENT Stroke warning signs and symptoms How to activate emergency medical system (call 911). Medications prescribed at discharge. Need for follow-up after discharge. Personal risk factors for stroke. Pneumonia vaccine given: No Flu vaccine given: No My questions have been answered, the writing is legible, and I understand these instructions.  I will adhere to these goals & educational materials that have been provided to me after my discharge from the hospital.     My questions have been answered and I understand these instructions. I will adhere to these goals and the provided educational materials after my discharge from the hospital.  Patient/Caregiver Signature _______________________________ Date __________  Clinician Signature _______________________________________ Date __________  Please bring this form and your medication list with you to all your follow-up doctor's appointments.

## 2023-03-08 NOTE — Plan of Care (Signed)
  Problem: RH Balance Goal: LTG: Patient will maintain dynamic sitting balance (OT) Description: LTG:  Patient will maintain dynamic sitting balance with assistance during activities of daily living (OT) Flowsheets (Taken 03/08/2023 1254) LTG: Pt will maintain dynamic sitting balance during ADLs with: Supervision/Verbal cueing Goal: LTG Patient will maintain dynamic standing with ADLs (OT) Description: LTG:  Patient will maintain dynamic standing balance with assist during activities of daily living (OT)  Flowsheets (Taken 03/08/2023 1254) LTG: Pt will maintain dynamic standing balance during ADLs with: Contact Guard/Touching assist   Problem: Sit to Stand Goal: LTG:  Patient will perform sit to stand in prep for activites of daily living with assistance level (OT) Description: LTG:  Patient will perform sit to stand in prep for activites of daily living with assistance level (OT) Flowsheets (Taken 03/08/2023 1254) LTG: PT will perform sit to stand in prep for activites of daily living with assistance level: Contact Guard/Touching assist   Problem: RH Grooming Goal: LTG Patient will perform grooming w/assist,cues/equip (OT) Description: LTG: Patient will perform grooming with assist, with/without cues using equipment (OT) Flowsheets (Taken 03/08/2023 1254) LTG: Pt will perform grooming with assistance level of: Supervision/Verbal cueing   Problem: RH Bathing Goal: LTG Patient will bathe all body parts with assist levels (OT) Description: LTG: Patient will bathe all body parts with assist levels (OT) Flowsheets (Taken 03/08/2023 1254) LTG: Pt will perform bathing with assistance level/cueing: Contact Guard/Touching assist   Problem: RH Dressing Goal: LTG Patient will perform upper body dressing (OT) Description: LTG Patient will perform upper body dressing with assist, with/without cues (OT). Flowsheets (Taken 03/08/2023 1254) LTG: Pt will perform upper body dressing with assistance  level of: Set up assist Goal: LTG Patient will perform lower body dressing w/assist (OT) Description: LTG: Patient will perform lower body dressing with assist, with/without cues in positioning using equipment (OT) Flowsheets (Taken 03/08/2023 1254) LTG: Pt will perform lower body dressing with assistance level of: Contact Guard/Touching assist   Problem: RH Toileting Goal: LTG Patient will perform toileting task (3/3 steps) with assistance level (OT) Description: LTG: Patient will perform toileting task (3/3 steps) with assistance level (OT)  Flowsheets (Taken 03/08/2023 1254) LTG: Pt will perform toileting task (3/3 steps) with assistance level: Contact Guard/Touching assist   Problem: RH Toilet Transfers Goal: LTG Patient will perform toilet transfers w/assist (OT) Description: LTG: Patient will perform toilet transfers with assist, with/without cues using equipment (OT) Flowsheets (Taken 03/08/2023 1254) LTG: Pt will perform toilet transfers with assistance level of: Contact Guard/Touching assist   Problem: RH Tub/Shower Transfers Goal: LTG Patient will perform tub/shower transfers w/assist (OT) Description: LTG: Patient will perform tub/shower transfers with assist, with/without cues using equipment (OT) Flowsheets (Taken 03/08/2023 1254) LTG: Pt will perform tub/shower stall transfers with assistance level of: Contact Guard/Touching assist LTG: Pt will perform tub/shower transfers from: Walk in shower

## 2023-03-08 NOTE — Evaluation (Signed)
Occupational Therapy Assessment and Plan  Patient Details  Name: Douglas Melton MRN: [id number] Date of Birth: 1936-03-20  OT Diagnosis: blindness and low vision and muscle weakness (generalized) Rehab Potential: Rehab Potential (ACUTE ONLY): Good ELOS: 1-2 weeks   Today's Date: 03/08/2023 OT Individual Time: 1100-1200 OT Individual Time Calculation (min): 60 min     Hospital Problem: Principal Problem:   CVA (cerebral vascular accident) (HCC)   Past Medical History:  Past Medical History:  Diagnosis Date   Chronic kidney disease    Diabetes mellitus without complication (HCC)    Glaucoma    Hyperlipidemia    Hypertension    Past Surgical History:  Past Surgical History:  Procedure Laterality Date   CHOLECYSTECTOMY     TOTAL HIP ARTHROPLASTY Left 02/15/2020   Procedure: TOTAL HIP ARTHROPLASTY  ANTERIOR APPROACH;  Surgeon: Samson Frederic, MD;  Location: [facility] OR;  Service: Orthopedics;  Laterality: Left;    Assessment & Plan Clinical Impression:  Douglas Melton is an 87 year old male who presented to the emergency department on 03/01/2023 complaining of seizure-like activity exhibited by right upper extremity jerking movements.  He then described right upper extremity weakness.  He has a history of squamous cell carcinoma of the scalp and had undergone resection the day before by Dr. Christoper Allegra.  No history of seizure disorder.  Consulted with neurology and loaded with Keppra intravenously. LTM EEG negative for seizures. 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  CTA head and neck with moderate stenosis in both ICAs, right ACA and right PCA.  Severe stenosis of left vertebral artery and 60-65 stenosis of the right ICA.  2D echo with EF of 50 to 55%.  Given the CAA pattern and MRI neurology did not feel the patient is a candidate for anticoagulation and would continue aspirin 81 mg daily.  Hemoglobin A1c  5.3%.  Telemetry showed sinus tachycardia with PACs.  There was initial concern for atrial fibrillation but EKG was not consistent with this.  Cardiology was consulted and reviewed clinical course and agreed no diagnosis of atrial fibrillation.  Receiving metoprolol tartrate 25 mg twice daily.  Started on Crestor 10 mg daily.  Neurology does not recommend high intensity statin due to LDL near goal and pattern of CAA on MRI.  Patient has a history of diabetes mellitus type 2 controlled with glipizide at home.  This has been discontinued.  His past medical history is also significant for chronic kidney disease stage IIIa, hypothyroidism, bilateral blindness. Patient requiring min A for step pivot transfer chair>EOB and able to progress short gait away from and back to EOB with min A and HHA on R. Tolerating diet. The patient requires inpatient medicine and rehabilitation evaluations and services for ongoing dysfunction secondary to acute bilateral embolic infarcts.   Patient transferred to CIR on 03/07/2023 .    Patient currently requires mod with basic self-care skills secondary to muscle weakness, decreased cardiorespiratoy endurance, decreased motor planning, central origin, and decreased sitting balance, decreased standing balance, decreased postural control, and decreased balance strategies.  Prior to hospitalization, patient could complete BADLs with independent .  Patient will benefit from skilled intervention to decrease level of assist with basic self-care skills and increase independence with basic self-care skills prior to discharge home with care partner.  Anticipate patient will require 24 hour supervision and follow up home health.  OT - End of Session Activity Tolerance: Decreased this session Endurance Deficit: Yes Endurance  Deficit Description: Pt reporting fatigue after <10 ft of functional mobility OT Assessment Rehab Potential (ACUTE ONLY): Good OT Patient demonstrates impairments in  the following area(s): Balance;Endurance;Motor OT Basic ADL's Functional Problem(s): Bathing;Dressing;Toileting;Grooming OT Transfers Functional Problem(s): Toilet;Tub/Shower OT Plan OT Intensity: Minimum of 1-2 x/day, 45 to 90 minutes OT Frequency: 5 out of 7 days OT Duration/Estimated Length of Stay: 1-2 weeks OT Treatment/Interventions: Balance/vestibular training;Discharge planning;Pain management;Self Care/advanced ADL retraining;Therapeutic Activities;UE/LE Coordination activities;Cognitive remediation/compensation;Disease mangement/prevention;Functional mobility training;Patient/family education;Skin care/wound managment;Therapeutic Exercise;Community reintegration;DME/adaptive equipment instruction;Neuromuscular re-education;Splinting/orthotics;Psychosocial support;UE/LE Strength taining/ROM OT Self Feeding Anticipated Outcome(s): Set-up OT Basic Self-Care Anticipated Outcome(s): Supervision OT Toileting Anticipated Outcome(s): CGA OT Bathroom Transfers Anticipated Outcome(s): CGA OT Recommendation Patient destination: Home Follow Up Recommendations: Home health OT   OT Evaluation Precautions/Restrictions  Precautions Precautions: Fall Precaution Comments: blind Restrictions Weight Bearing Restrictions: No General Chart Reviewed: Yes Additional Pertinent History: squamous cell carcinoma of the scalp and had undergone resection the day prior to CVA Response to Previous Treatment: Patient with no complaints from previous session Family/Caregiver Present: Yes (Pt's wife, Philis) Vital Signs Therapy Vitals Pulse Rate: 77 BP: 112/60 Pain Pain Assessment Pain Scale: 0-10 Pain Score: 0-No pain Home Living/Prior Functioning Home Living Living Arrangements: Spouse/significant other Available Help at Discharge: Family Type of Home: House Home Access: Other (comment) Entrance Stairs-Number of Steps: stair lift from basement to main level Home Layout: Multi-level, Able to live  on main level with bedroom/bathroom Alternate Level Stairs-Number of Steps: 3 steps at front door Alternate Level Stairs-Rails: Left Bathroom Shower/Tub: Health visitor: Handicapped height Bathroom Accessibility: Yes  Lives With: Spouse IADL History Homemaking Responsibilities: No (wife assists) Current License: No Mode of Transportation: Family Occupation: Retired Prior Function Level of Independence: Independent with basic ADLs, Independent with homemaking with ambulation, Independent with gait, Independent with transfers  Able to Take Stairs?: Yes Driving: No 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 [place] Vision Baseline Vision/History: 2 Legally blind;3 Glaucoma;4 Cataracts;5 Retinopathy Ability to See in Adequate Light: 4 Severely impaired (R eye no vision; L eye can see light only) Patient Visual Report: No change from baseline Perception  Perception: Within Functional Limits Praxis Praxis: WFL Cognition Cognition Overall Cognitive Status: Within Functional Limits for tasks assessed Orientation Level: Place;Situation;Person Person: Oriented Place: Oriented Situation: Oriented Memory: Appears intact Awareness: Appears intact Problem Solving: Appears intact Executive Function:  (all intact) Safety/Judgment: Appears intact Brief Interview for Mental Status (BIMS) Repetition of Three Words (First Attempt): 3 Temporal Orientation: Year: Correct Temporal Orientation: Month: Accurate within 5 days Temporal Orientation: Day: Correct Recall: "Sock": Yes, no cue required Recall: "Blue": Yes, no cue required Recall: "Bed": Yes, no cue required BIMS Summary Score: 15 Sensation Sensation Light Touch: Appears Intact Hot/Cold: Appears Intact Proprioception: Appears Intact Stereognosis: Not tested Coordination Gross Motor Movements are Fluid and Coordinated: No Fine Motor Movements are Fluid and  Coordinated: Yes Coordination and Movement Description: Slowed gait speed with shuffled feet d/t general deconditioning Finger Nose Finger Test: Unable to complete d/t vision imparement Motor  Motor Motor: Other (comment) Motor - Skilled Clinical Observations: U/LB weakness d/t general deconditioning with signifcant endurance deficit  Trunk/Postural Assessment  Cervical Assessment Cervical Assessment: Exceptions to [facility] (mild forward head) Thoracic Assessment Thoracic Assessment: Exceptions to [facility] (mild rounded shoudlers) Lumbar Assessment Lumbar Assessment: Within Functional Limits Postural Control Postural Control: Deficits on evaluation Trunk Control: Mild deficit with Pt swaying when sitting EOB  Balance Balance Balance Assessed: Yes Static Sitting Balance Static  Sitting - Balance Support: Feet supported Static Sitting - Level of Assistance: 5: Stand by assistance Dynamic Sitting Balance Dynamic Sitting - Balance Support: Feet supported Dynamic Sitting - Level of Assistance: 5: Stand by assistance Dynamic Sitting - Balance Activities:  (BADLs) Static Standing Balance Static Standing - Balance Support: During functional activity;Bilateral upper extremity supported Static Standing - Level of Assistance: 4: Min assist Dynamic Standing Balance Dynamic Standing - Balance Support: During functional activity;Bilateral upper extremity supported Dynamic Standing - Level of Assistance: 3: Mod assist;4: Min assist Extremity/Trunk Assessment RUE Assessment RUE Assessment: Exceptions to [facility] General Strength Comments: Mild strength impairment 4/5 overall LUE Assessment LUE Assessment: Exceptions to [facility] General Strength Comments: Mild strength impairement 4/5  Care Tool Care Tool Self Care Eating   Eating Assist Level: Supervision/Verbal cueing    Oral Care    Oral Care Assist Level: Supervision/Verbal cueing    Bathing   Body parts bathed by patient: Right arm;Left  arm;Chest;Abdomen;Front perineal area;Face;Left upper leg;Right upper leg Body parts bathed by helper: Buttocks;Left lower leg;Right lower leg   Assist Level: Minimal Assistance - Patient > 75%    Upper Body Dressing(including orthotics)   What is the patient wearing?: Pull over shirt   Assist Level: Minimal Assistance - Patient > 75%    Lower Body Dressing (excluding footwear)   What is the patient wearing?: Pants;Underwear/pull up Assist for lower body dressing: Moderate Assistance - Patient 50 - 74%    Putting on/Taking off footwear   What is the patient wearing?: Socks;Shoes Assist for footwear: Contact Guard/Touching assist       Care Tool Toileting Toileting activity   Assist for toileting: Moderate Assistance - Patient 50 - 74%     Care Tool Bed Mobility Roll left and right activity   Roll left and right assist level: Supervision/Verbal cueing    Sit to lying activity   Sit to lying assist level: Minimal Assistance - Patient > 75%    Lying to sitting on side of bed activity   Lying to sitting on side of bed assist level: the ability to move from lying on the back to sitting on the side of the bed with no back support.: Minimal Assistance - Patient > 75%     Care Tool Transfers Sit to stand transfer   Sit to stand assist level: Moderate Assistance - Patient 50 - 74%    Chair/bed transfer   Chair/bed transfer assist level: Moderate Assistance - Patient 50 - 74%     Toilet transfer   Assist Level: Moderate Assistance - Patient 50 - 74%     Care Tool Cognition  Expression of Ideas and Wants Expression of Ideas and Wants: 4. Without difficulty (complex and basic) - expresses complex messages without difficulty and with speech that is clear and easy to understand  Understanding Verbal and Non-Verbal Content Understanding Verbal and Non-Verbal Content: 4. Understands (complex and basic) - clear comprehension without cues or repetitions   Memory/Recall Ability  Memory/Recall Ability : Current season;Location of own room;That he or she is in a hospital/hospital unit   Refer to Care Plan for Long Term Goals  SHORT TERM GOAL WEEK 1 OT Short Term Goal 1 (Week 1): Pt will complete 3/3 toileting steps with min A using LRAD OT Short Term Goal 2 (Week 1): Pt will tolerate standing 3 minute during BADLs or functional tasks using LRAD with CGA OT Short Term Goal 3 (Week 1): Pt will complete LB dressing wtih min A usign  LRAD  Recommendations for other services: None    Skilled Therapeutic Intervention Skilled Therapeutic Interventions/Progress Updates:  1:1 OT evaluation and intervention initiated with skilled education provided on OT role, goals, and POC. Pt received reclined in bed presenting to be in good spirits receptive to skilled OT session reporting 0/10 pain- OT offering intermittent rest breaks, repositioning, and therapeutic support to optimize participation in therapy session. Pt dressed for the day upon OT arrival so BADLs simulated during session with Pt able to complete at levels listed below. Pt presenting with generalized weakness d/t deconditioning and significant endurance deficit. Pt able to complete ~10 ft of functional mobility during session using RW with min A required for balance and RW management and mod A required to power up to standing position with prolonged rest break provided following d/t Pt reporting fatigue. Pt would benefit from continued OT services in IPR setting to increase Pt's independence in BADLs and decrease overall bourdon of care.  Pt was left resting in bed with call bell in reach, bed alarm on, wife present in room, and all needs met.   ADL ADL Eating: Supervision/safety Where Assessed-Eating: Bed level Grooming: Contact guard Where Assessed-Grooming: Edge of bed Upper Body Bathing: Supervision/safety Where Assessed-Upper Body Bathing: Edge of bed Lower Body Bathing: Minimal assistance Where Assessed-Lower Body  Bathing: Edge of bed Upper Body Dressing: Supervision/safety Where Assessed-Upper Body Dressing: Edge of bed Lower Body Dressing: Moderate assistance Where Assessed-Lower Body Dressing: Edge of bed Toileting: Moderate assistance Where Assessed-Toileting: Teacher, adult education: Moderate assistance Toilet Transfer Method: Proofreader: Engineer, technical sales: Not assessed Film/video editor: Not assessed Mobility  Bed Mobility Bed Mobility: Rolling Right;Rolling Left;Supine to Sit;Sit to Supine Rolling Right: Supervision/verbal cueing Rolling Left: Supervision/Verbal cueing Supine to Sit: Supervision/Verbal cueing Sit to Supine: Minimal Assistance - Patient > 75%;Supervision/Verbal cueing Transfers Sit to Stand: Moderate Assistance - Patient 50-74% Stand to Sit: Moderate Assistance - Patient 50-74%   Discharge Criteria: Patient will be discharged from OT if patient refuses treatment 3 consecutive times without medical reason, if treatment goals not met, if there is a change in medical status, if patient makes no progress towards goals or if patient is discharged from hospital.  The above assessment, treatment plan, treatment alternatives and goals were discussed and mutually agreed upon: by patient and by family  Clide Deutscher 03/08/2023, 12:44 PM

## 2023-03-08 NOTE — Progress Notes (Signed)
Occupational Therapy Session Note  Patient Details  Name: Ballard Thibodeaux MRN: [id number] Date of Birth: 01/02/1936  Today's Date: 03/08/2023 OT Missed Time: 75 Minutes Missed Time Reason: Patient fatigue;Patient unwilling/refused to participate without medical reason;Pain (Pt in pain following suture removal refusing therapy session)   Short Term Goals: Week 1:  OT Short Term Goal 1 (Week 1): Pt will complete 3/3 toileting steps with min A using LRAD OT Short Term Goal 2 (Week 1): Pt will tolerate standing 3 minute during BADLs or functional tasks using LRAD with CGA OT Short Term Goal 3 (Week 1): Pt will complete LB dressing wtih min A usign LRAD  Skilled Therapeutic Interventions/Progress Updates:  Attempted to see Pt for PM session. Pt reporting pain and fatigue following bolster removal prior to therapy session declining to participate in therapy session despite maximal therapeutic support, encouragement, and education provided on importance of therapy session. Missed 75 minutes of skilled OT treatment. Will attempt to make up time as schedule allows.   Therapy Documentation Precautions:  Precautions Precautions: Fall Precaution Comments: blind Restrictions Weight Bearing Restrictions: No  Therapy/Group: Individual Therapy  Clide Deutscher 03/08/2023, 2:32 PM

## 2023-03-08 NOTE — Progress Notes (Signed)
Inpatient Rehabilitation Center Individual Statement of Services  Patient Name:  Douglas Melton  Date:  03/08/2023  Welcome to the Inpatient Rehabilitation Center.  Our goal is to provide you with an individualized program based on your diagnosis and situation, designed to meet your specific needs.  With this comprehensive rehabilitation program, you will be expected to participate in at least 3 hours of rehabilitation therapies Monday-Friday, with modified therapy programming on the weekends.  Your rehabilitation program will include the following services:  Physical Therapy (PT), Occupational Therapy (OT), Speech Therapy (ST), 24 hour per day rehabilitation nursing, Therapeutic Recreaction (TR), Neuropsychology, Care Coordinator, Rehabilitation Medicine, Nutrition Services, Pharmacy Services, and Other  Weekly team conferences will be held on Wednesdays to discuss your progress.  Your Inpatient Rehabilitation Care Coordinator will talk with you frequently to get your input and to update you on team discussions.  Team conferences with you and your family in attendance may also be held.  Expected length of stay: 10-12 Days  Overall anticipated outcome:  supervision  Depending on your progress and recovery, your program may change. Your Inpatient Rehabilitation Care Coordinator will coordinate services and will keep you informed of any changes. Your Inpatient Rehabilitation Care Coordinator's name and contact numbers are listed  below.  The following services may also be recommended but are not provided by the Inpatient Rehabilitation Center:   Home Health Rehabiltiation Services Outpatient Rehabilitation Services    Arrangements will be made to provide these services after discharge if needed.  Arrangements include referral to agencies that provide these services.  Your insurance has been verified to be:  [facility] Medicare Your primary doctor is:  Benedetto Goad, MD  Pertinent information will  be shared with your doctor and your insurance company.  Inpatient Rehabilitation Care Coordinator:  Lavera Guise, [place] [phone] or [phone]  Information discussed with and copy given to patient by: Andria Rhein, 03/08/2023, 9:39 AM

## 2023-03-08 NOTE — Progress Notes (Signed)
PROGRESS NOTE   Subjective/Complaints:  Pt states he has had visual impairment x 78yrs, completely blind in RIght eye and partially blind in left eye ROS- neg CP, SOB, N/V/D Objective:   No results found. Recent Labs    03/08/23 0432  WBC 12.1*  HGB 12.0*  HCT 35.8*  PLT 378   Recent Labs    03/08/23 0432  NA 139  K 4.4  CL 105  CO2 27  GLUCOSE 131*  BUN 37*  CREATININE 1.81*  CALCIUM 8.8*    Intake/Output Summary (Last 24 hours) at 03/08/2023 0817 Last data filed at 03/08/2023 0800 Gross per 24 hour  Intake 354 ml  Output 250 ml  Net 104 ml        Physical Exam: Vital Signs Blood pressure (!) 109/54, pulse 80, temperature 98.6 F (37 C), resp. rate 18, height 6\' 2"  (1.88 m), weight 59.1 kg, SpO2 98%.   General: No acute distress Mood and affect are appropriate Heart: Regular rate and rhythm no rubs murmurs or extra sounds Lungs: Clear to auscultation, breathing unlabored, no rales or wheezes Abdomen: Positive bowel sounds, soft nontender to palpation, nondistended Extremities: No clubbing, cyanosis, or edema Skin: No evidence of breakdown, no evidence of rash Neurologic: Cranial nerves II through XII intact, motor strength is 4/5 in bilateral deltoid, bicep, tricep, grip, hip flexor, knee extensors, ankle dorsiflexor and plantar flexor Sensory exam normal sensation to light touch and proprioception in bilateral upper and lower extremities Cerebellar examcould not perform due to visual impairment  Musculoskeletal: Full range of motion in all 4 extremities. No joint swelling   Assessment/Plan: 1. Functional deficits which require 3+ hours per day of interdisciplinary therapy in a comprehensive inpatient rehab setting. Physiatrist is providing close team supervision and 24 hour management of active medical problems listed below. Physiatrist and rehab team continue to assess barriers to  discharge/monitor patient progress toward functional and medical goals  Care Tool:  Bathing              Bathing assist       Upper Body Dressing/Undressing Upper body dressing        Upper body assist      Lower Body Dressing/Undressing Lower body dressing            Lower body assist       Toileting Toileting    Toileting assist       Transfers Chair/bed transfer  Transfers assist           Locomotion Ambulation   Ambulation assist              Walk 10 feet activity   Assist           Walk 50 feet activity   Assist           Walk 150 feet activity   Assist           Walk 10 feet on uneven surface  activity   Assist           Wheelchair     Assist               Wheelchair 50  feet with 2 turns activity    Assist            Wheelchair 150 feet activity     Assist          Blood pressure (!) 109/54, pulse 80, temperature 98.6 F (37 C), resp. rate 18, height 6\' 2"  (1.88 m), weight 59.1 kg, SpO2 98%.  Medical Problem List and Plan: 1. Functional deficits secondary to bilateral frontal and parietal lobe infarcts as well as cerebral amyloid angiopathy             -patient may shower             -ELOS/Goals: 9-11 days, supervision goals   2.  Antithrombotics: -DVT/anticoagulation:  Mechanical:  Antiembolism stockings, knee (TED hose) Bilateral lower extremities             -antiplatelet therapy: aspirin 81 mg daily. No AC due to cerebral amyloid angiopathy   3. Pain Management: Tylenol as needed   4. Mood/Behavior/Sleep: LCSW to evaluate and provide emotional support             -antipsychotic agents: n/a   5. Neuropsych/cognition: This patient is capable of making decisions on his own behalf.   6. Skin/Wound Care: Routine skin care checks             -foam borders to heels/Prevalon boots   7. Fluids/Electrolytes/Nutrition: Routine Is and Os and follow-up chemistries   8:  Seizure-like activity: continue Keppra 500 mg BID             -follow-up GNA   9: Hyperlipidemia: continue statin   10: Bilateral blindness: can see light/dark through left eye only- combination of glaucoma , retinal issues as well as optic nerve .    11: Hypothyroidism: continue Synthroid   12: DM-2: A1C = 5.8% (home glipizide discontinued)             -dc SSI now and give Ensure with meals             -carb modified diet   13: CKD stage III: Baseline creatinine ~1.4; stable             -follow-up BMP   14: SSC scalp s/p resection: remove dressing 11/12>>recommendation to cut sutures of bolster and to remove bolster; synthetic matrix underlying should stay in place and be kept moist with application of Xeroform gauze and petroleum jelly daily             -follow-up with Dr. Christoper Allegra (phone [phone] if questions re: dressing)   15: Sinus tachycardia with PACs; atrial fib ruled out             -on metoprolol 25 mg BID   16: Anemia of chronic disease/? iron deficiency: continue oral iron             -follow-up CBC    LOS: 1 days A FACE TO FACE EVALUATION WAS PERFORMED  Erick Colace 03/08/2023, 8:17 AM

## 2023-03-08 NOTE — Progress Notes (Signed)
Bolster removed (see photo).  Xeroform placed and thin layer of petrolatum jelly applied.

## 2023-03-08 NOTE — Progress Notes (Signed)
Inpatient Rehabilitation  Patient information reviewed and entered into eRehab system by Demarrio Menges Gentry, OTR/L, Rehab Quality Coordinator.   Information including medical coding, functional ability and quality indicators will be reviewed and updated through discharge.   

## 2023-03-08 NOTE — Progress Notes (Signed)
Inpatient Rehabilitation Care Coordinator Assessment and Plan Patient Details  Name: Douglas Melton MRN: [id number] Date of Birth: [birth year]  Today's Date: 03/08/2023  Hospital Problems: Principal Problem:   CVA (cerebral vascular accident) [facility])  Past Medical History:  Past Medical History:  Diagnosis Date   Chronic kidney disease    Diabetes mellitus without complication (HCC)    Glaucoma    Hyperlipidemia    Hypertension    Past Surgical History:  Past Surgical History:  Procedure Laterality Date   CHOLECYSTECTOMY     TOTAL HIP ARTHROPLASTY Left 02/15/2020   Procedure: TOTAL HIP ARTHROPLASTY  ANTERIOR APPROACH;  Surgeon: Samson Frederic, MD;  Location: [facility] OR;  Service: Orthopedics;  Laterality: Left;   Social History:  reports that he has quit smoking. His smoking use included cigarettes. He has never used smokeless tobacco. He reports that he does not drink alcohol and does not use drugs.  Family / Support Systems Marital Status: Married How Long?: n/a Patient Roles: Spouse, Parent Spouse/Significant Other: Phyliis Oceanographer Children: Son, Josten Covington Other Supports: n/a Anticipated Caregiver: spouse Ability/Limitations of Caregiver: Retired and able to assist Caregiver Availability: 24/7 Family Dynamics: supportive spouse and son  Social History Preferred language: English Religion:  Cultural Background: n/a Education: HS Health Literacy - How often do you need to have someone help you when you read instructions, pamphlets, or other written material from your doctor or pharmacy?: Always Writes: No Employment Status: Disabled Date Retired/Disabled/Unemployed: n/a Marine scientist Issues: n/a Guardian/Conservator: n/a   Abuse/Neglect Abuse/Neglect Assessment Can Be Completed: Yes Physical Abuse: Denies Verbal Abuse: Denies Sexual Abuse: Denies Exploitation of patient/patient's resources: Denies Self-Neglect: Denies  Patient response  to: Social Isolation - How often do you feel lonely or isolated from those around you?: Never  Emotional Status Pt's affect, behavior and adjustment status: Pleasant. Patient Blind and HOH Recent Psychosocial Issues: Coping Psychiatric History: N/A Substance Abuse History: N/A  Patient / Family Perceptions, Expectations & Goals Pt/Family understanding of illness & functional limitations: yes, met with patient and spouse at bedside Premorbid pt/family roles/activities: Patient recieving asssistance with self care and cogntive tasks prior Anticipated changes in roles/activities/participation: Spouse plans to continue assisting at discharge Pt/family expectations/goals: Supervision  [employer] Agencies: None Premorbid Home Care/DME Agencies: Other (Comment) Animal nutritionist, RW and TTB) Transportation available at discharge: spouse/son Is the patient able to respond to transportation needs?: Yes In the past 12 months, has lack of transportation kept you from medical appointments or from getting medications?: No In the past 12 months, has lack of transportation kept you from meetings, work, or from getting things needed for daily living?: No Resource referrals recommended: Neuropsychology  Discharge Planning Living Arrangements: Spouse/significant other Support Systems: Spouse/significant other Type of Residence: Private residence (2 level home. Stair Lift from [employer]) [employer]: Media planner (specify) (UHC MEDICARE) Financial Resources: SSD, Family Support Financial Screen Referred: No Living Expenses: Own Money Management: Spouse, Patient Does the patient have any problems obtaining your medications?: No Home Management: Assistance from spouse prior due to blindness and [facility] Patient/Family Preliminary Plans: Spouse plans to continue to assist with roles and tasks at home. Care Coordinator Barriers to Discharge: Lack of/limited family support, Decreased  caregiver support, Insurance for SNF coverage, Other (comments) Care Coordinator Anticipated Follow Up Needs: HH/OP Expected length of stay: 10-12 Days  Clinical Impression SW met with patient and spouse. Introduced self and explained role. Patient anticipates discharging back home with spouse to assist and provide supervision. Pt and spouse  live in a 2 level home with a stair lift on basement level to access main level. Patient currently has a RW, shower seat and grab bars in the home. Patient is hard or hearing and blind. Contact information provided to spouse. No additional questions or concerns. Sw will follow up with patient and spouse tomorrow after conference.   Andria Rhein 03/08/2023, 12:38 PM

## 2023-03-08 NOTE — Discharge Summary (Signed)
Physician Discharge Summary  Patient ID: Douglas Melton MRN: [id number] DOB/AGE: May 24, 1935 87 y.o.  Admit date: 03/07/2023 Discharge date: TBD  Discharge Diagnoses:  Principal Problem:   CVA (cerebral vascular accident) [facility]) Active problems: Functional deficits secondary to CVA Cerebral amyloid angiopathy Seizure-like activity Blindness SCC, fibroxanthoma scalp, s/p resection DM-2 Hyperlipidemia Chronic kidney disease stage III Sinus tachycardia with PACs Anemia   Discharged Condition: {condition:18240}  Significant Diagnostic Studies:  Labs:  Basic Metabolic Panel: Recent Labs  Lab 03/01/23 1839 03/02/23 0440 03/05/23 0439 03/08/23 0432  NA  --  139 139 139  K  --  3.7 3.9 4.4  CL  --  105 104 105  CO2  --  24 27 27   GLUCOSE  --  82 114* 131*  BUN  --  16 17 37*  CREATININE  --  1.49* 1.30* 1.81*  CALCIUM  --  8.6* 9.2 8.8*  MG 1.9  --   --   --     CBC: Recent Labs  Lab 03/02/23 0440 03/05/23 0439 03/08/23 0432  WBC 10.7* 10.8* 12.1*  NEUTROABS  --   --  7.1  HGB 12.0* 12.4* 12.0*  HCT 37.6* 37.1* 35.8*  MCV 99.2 96.6 96.0  PLT 309 309 378    CBG: Recent Labs  Lab 03/06/23 1636 03/06/23 2121 03/07/23 0619 03/07/23 1154 03/07/23 1656  GLUCAP 129* 111* 112* 178* 89    Brief HPI:   Douglas Melton is a 87 y.o. male who presented to the emergency department on 03/01/2023 complaining of seizure-like activity exhibited by right upper extremity jerking movements.  He then described right upper extremity weakness.  He has a history of squamous cell carcinoma of the scalp and had undergone resection the day before by Dr. Christoper Allegra.  No history of seizure disorder.  Consulted with neurology and loaded with Keppra intravenously. LTM EEG negative for seizures. 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  CTA head and neck with moderate stenosis in both ICAs, right ACA and right  PCA.  Severe stenosis of left vertebral artery and 60-65 stenosis of the right ICA.  2D echo with EF of 50 to 55%.  Given the CAA pattern and MRI neurology did not feel the patient is a candidate for anticoagulation and would continue aspirin 81 mg daily.  Hemoglobin A1c 5.3%.  Telemetry showed sinus tachycardia with PACs.  There was initial concern for atrial fibrillation but EKG was not consistent with this.  Cardiology was consulted and reviewed clinical course and agreed no diagnosis of atrial fibrillation.  Receiving metoprolol tartrate 25 mg twice daily.  Started on Crestor 10 mg daily.  Neurology does not recommend high intensity statin due to LDL near goal and pattern of CAA on MRI.  Patient has a history of diabetes mellitus type 2 controlled with glipizide at home.  This has been discontinued.  His past medical history is also significant for chronic kidney disease stage IIIa, hypothyroidism, bilateral blindness. Patient requiring min A for step pivot transfer chair>EOB and able to progress short gait away from and back to EOB with min A and HHA on R. Tolerating diet.    Hospital Course: Douglas Melton was admitted to rehab 03/07/2023 for inpatient therapies to consist of PT, ST and OT at least three hours five days a week. Past admission physiatrist, therapy team and rehab RN have worked together to provide customized collaborative inpatient rehab. Admission labs with increase in  BUN/Cr to 37/1.81 and given IVF bolus. WBC count up from 10.8 to 12.1. H and H stable. Scalp bolster removed and Xeroform placed and thin layer of petrolatum jelly applied. Daily dressing changes continued. H and H stable.    Blood pressures were monitored on TID basis and remained stable. He remained on metoprolol 25 mg BID on admission secondary to sinus tach with PACs. Now bradycardic and reduced dose to 12.5 mg BID.  Diabetes has been monitored with CBG checks and started carb controlled diet.   Rehab course:  During patient's stay in rehab weekly team conferences were held to monitor patient's progress, set goals and discuss barriers to discharge. At admission, patient required mod assist with mobility and with basic self-care skills.   He has had improvement in activity tolerance, balance, postural control as well as ability to compensate for deficits. He has had improvement in functional use RUE/LUE  and RLE/LLE as well as improvement in awareness       Disposition:  There are no questions and answers to display.         Diet: carb modified  Special Instructions: No driving, alcohol consumption or tobacco use.  Patient is s/p recent wide local excision and outer table craniectomy for squamous cell carcinoma and fibroxanthoma of the scalp at [facility]. Recommendation for dressing removal on 11/12. Per discussion with outpatient neurosurgeon, Dr. Christoper Allegra, recommendation to cut sutures of bolster and to remove bolster; synthetic matrix underlying should stay in place and be kept moist with application of Xeroform gauze and petroleum jelly daily.   Allergies as of 03/08/2023       Reactions   Adhesive [tape] Rash   Fluorescein-benoxinate Rash   FA dye (Fluress- eyes)   Valtrex [valacyclovir] Anxiety   Nervousness Jittery     Med Rec must be completed prior to using this SMARTLINK***        Signed: Milinda Antis 03/08/2023, 10:41 AM

## 2023-03-08 NOTE — Progress Notes (Addendum)
Inpatient Rehabilitation Admission Medication Review by a Pharmacist  A complete drug regimen review was completed for this patient to identify any potential clinically significant medication issues.  High Risk Drug Classes Is patient taking? Indication by Medication  Antipsychotic No   Anticoagulant No   Antibiotic No   Opioid Yes Tramadol: pain  Antiplatelet Yes Aspirin: CVA ppx  Hypoglycemics/insulin No   Vasoactive Medication Yes Lopressor: sinus tachycardia w/PACs  Chemotherapy No   Other Yes FeSO4, vitB12, vitD: vitamins/supplements  Keppra: seizures Levothyroxine: hypothyroid Crestor: stroke ppx Tylenol: pain Mylanta: indigestion Bisacodyl, Miralax, Fleet: constipation Benadryl: itching Robitussin: cough Melatonin: sleep Robaxin: muscle spasms Zofran: nausea     Type of Medication Issue Identified Description of Issue Recommendation(s)  Drug Interaction(s) (clinically significant)     Duplicate Therapy     Allergy     No Medication Administration End Date     Incorrect Dose     Additional Drug Therapy Needed     Significant med changes from prior encounter (inform family/care partners about these prior to discharge). Glipizide discontinued. New Aspirin, Crestor, Lopressor and Keppra Restart or discontinue as appropriate. Communicate medication changes with patient/family at discharge  Other       Clinically significant medication issues were identified that warrant physician communication and completion of prescribed/recommended actions by midnight of the next day:  No   Time spent performing this drug regimen review (minutes): 30   Thank you for allowing pharmacy to be a part of this patient's care.   Signe Colt, PharmD 03/08/2023 8:53 AM   **Pharmacist phone directory can be found on [email or web address] listed under [facility]**

## 2023-03-09 DIAGNOSIS — I6340 Cerebral infarction due to embolism of unspecified cerebral artery: Secondary | ICD-10-CM | POA: Diagnosis not present

## 2023-03-09 DIAGNOSIS — D044 Carcinoma in situ of skin of scalp and neck: Secondary | ICD-10-CM | POA: Diagnosis not present

## 2023-03-09 DIAGNOSIS — I680 Cerebral amyloid angiopathy: Secondary | ICD-10-CM | POA: Diagnosis not present

## 2023-03-09 DIAGNOSIS — H409 Unspecified glaucoma: Secondary | ICD-10-CM | POA: Diagnosis not present

## 2023-03-09 DIAGNOSIS — R001 Bradycardia, unspecified: Secondary | ICD-10-CM

## 2023-03-09 LAB — BASIC METABOLIC PANEL
Anion gap: 5 (ref 5–15)
BUN: 37 mg/dL — ABNORMAL HIGH (ref 8–23)
CO2: 26 mmol/L (ref 22–32)
Calcium: 8.6 mg/dL — ABNORMAL LOW (ref 8.9–10.3)
Chloride: 104 mmol/L (ref 98–111)
Creatinine, Ser: 1.66 mg/dL — ABNORMAL HIGH (ref 0.61–1.24)
GFR, Estimated: 40 mL/min — ABNORMAL LOW (ref >60)
Glucose, Bld: 140 mg/dL — ABNORMAL HIGH (ref 70–99)
Potassium: 3.9 mmol/L (ref 3.5–5.1)
Sodium: 135 mmol/L (ref 135–145)

## 2023-03-09 LAB — GLUCOSE, CAPILLARY: Glucose-Capillary: 192 mg/dL — ABNORMAL HIGH (ref 70–99)

## 2023-03-09 MED ORDER — METOPROLOL TARTRATE 12.5 MG HALF TABLET
12.5000 mg | ORAL_TABLET | Freq: Two times a day (BID) | ORAL | Status: DC
  Administered 2023-03-09 – 2023-03-16 (×14): 12.5 mg via ORAL
  Filled 2023-03-09 (×14): qty 1

## 2023-03-09 NOTE — Progress Notes (Signed)
Physical Therapy Note  Patient Details  Name: Prateek Christan MRN: [id number] Date of Birth: 1936/02/22 Today's Date: 03/09/2023    Pt's plan of care adjusted to 15/7 after speaking with care team and discussed with MD in team conference as pt currently unable to tolerate current therapy schedule with OT, PT, and SLP.    Rastus Borton M Tish Begin 03/09/2023, 11:01 AM

## 2023-03-09 NOTE — Plan of Care (Signed)
  Problem: Consults Goal: RH STROKE PATIENT EDUCATION Description: See Patient Education module for education specifics  Outcome: Progressing   Problem: RH BOWEL ELIMINATION Goal: RH STG MANAGE BOWEL WITH ASSISTANCE Description: STG Manage Bowel with min Assistance. Outcome: Progressing Goal: RH STG MANAGE BOWEL W/MEDICATION W/ASSISTANCE Description: STG Manage Bowel with Medication with min Assistance. Outcome: Progressing   Problem: RH BLADDER ELIMINATION Goal: RH STG MANAGE BLADDER WITH ASSISTANCE Description: STG Manage Bladder With min Assistance Outcome: Progressing   Problem: RH SKIN INTEGRITY Goal: RH STG SKIN FREE OF INFECTION/BREAKDOWN Description: Incision will continue to heal and be free of infection/breakdown with min assist  Outcome: Progressing Goal: RH STG MAINTAIN SKIN INTEGRITY WITH ASSISTANCE Description: STG Maintain Skin Integrity With min Assistance. Outcome: Progressing Goal: RH STG ABLE TO PERFORM INCISION/WOUND CARE W/ASSISTANCE Description: STG Able To Perform Incision/Wound Care With min Assistance. Outcome: Progressing   Problem: RH SAFETY Goal: RH STG ADHERE TO SAFETY PRECAUTIONS W/ASSISTANCE/DEVICE Description: STG Adhere to Safety Precautions With cueing Assistance/Device. Outcome: Progressing Goal: RH STG DECREASED RISK OF FALL WITH ASSISTANCE Description: STG Decreased Risk of Fall With min Assistance. Outcome: Progressing   Problem: RH PAIN MANAGEMENT Goal: RH STG PAIN MANAGED AT OR BELOW PT'S PAIN GOAL Description: Less than 3 with PRN medications min assist  Outcome: Progressing   Problem: RH KNOWLEDGE DEFICIT Goal: RH STG INCREASE KNOWLEDGE OF HYPERTENSION Description: Patient/caregiver will be able to manage HTN medications and diet/lifestyle modifications to manage appropriately from nursing education and nursing handouts independently   Outcome: Progressing Goal: RH STG INCREASE KNOWLEGDE OF HYPERLIPIDEMIA Description:  Patient/caregiver will be able to manage cholesterol medications and improve HDL level of 31 from nursing education and nursing handout independently  Outcome: Progressing Goal: RH STG INCREASE KNOWLEDGE OF STROKE PROPHYLAXIS Description: Patient/caregiver will be able to manage medications, diet/lifestyle modification to reduce the risk of additional stroke from nursing education and nursing handouts independently  Outcome: Progressing

## 2023-03-09 NOTE — Plan of Care (Signed)
  Problem: RH Balance Goal: LTG Patient will maintain dynamic sitting balance (PT) Description: LTG:  Patient will maintain dynamic sitting balance with assistance during mobility activities (PT) Flowsheets (Taken 03/09/2023 0515) LTG: Pt will maintain dynamic sitting balance during mobility activities with:: Independent with assistive device  Goal: LTG Patient will maintain dynamic standing balance (PT) Description: LTG:  Patient will maintain dynamic standing balance with assistance during mobility activities (PT) Flowsheets (Taken 03/09/2023 0515) LTG: Pt will maintain dynamic standing balance during mobility activities with:: Supervision/Verbal cueing   Problem: Sit to Stand Goal: LTG:  Patient will perform sit to stand with assistance level (PT) Description: LTG:  Patient will perform sit to stand with assistance level (PT) Flowsheets (Taken 03/09/2023 0515) LTG: PT will perform sit to stand in preparation for functional mobility with assistance level: Supervision/Verbal cueing   Problem: RH Bed Mobility Goal: LTG Patient will perform bed mobility with assist (PT) Description: LTG: Patient will perform bed mobility with assistance, with/without cues (PT). Flowsheets (Taken 03/09/2023 0515) LTG: Pt will perform bed mobility with assistance level of: Independent   Problem: RH Bed to Chair Transfers Goal: LTG Patient will perform bed/chair transfers w/assist (PT) Description: LTG: Patient will perform bed to chair transfers with assistance (PT). Flowsheets (Taken 03/09/2023 0515) LTG: Pt will perform Bed to Chair Transfers with assistance level: Supervision/Verbal cueing   Problem: RH Car Transfers Goal: LTG Patient will perform car transfers with assist (PT) Description: LTG: Patient will perform car transfers with assistance (PT). Flowsheets (Taken 03/09/2023 0515) LTG: Pt will perform car transfers with assist:: Contact Guard/Touching assist   Problem: RH Furniture  Transfers Goal: LTG Patient will perform furniture transfers w/assist (OT/PT) Description: LTG: Patient will perform furniture transfers  with assistance (OT/PT). Flowsheets (Taken 03/09/2023 0515) LTG: Pt will perform furniture transfers with assist:: Supervision/Verbal cueing   Problem: RH Ambulation Goal: LTG Patient will ambulate in controlled environment (PT) Description: LTG: Patient will ambulate in a controlled environment, # of feet with assistance (PT). Flowsheets (Taken 03/09/2023 0515) LTG: Pt will ambulate in controlled environ  assist needed:: Supervision/Verbal cueing LTG: Ambulation distance in controlled environment: 75 ft using LRAD Goal: LTG Patient will ambulate in home environment (PT) Description: LTG: Patient will ambulate in home environment, # of feet with assistance (PT). Flowsheets (Taken 03/09/2023 0515) LTG: Pt will ambulate in home environ  assist needed:: Supervision/Verbal cueing LTG: Ambulation distance in home environment: no more than 50 ft per bout using LRAD   Problem: RH Stairs Goal: LTG Patient will ambulate up and down stairs w/assist (PT) Description: LTG: Patient will ambulate up and down # of stairs with assistance (PT) Flowsheets (Taken 03/09/2023 0515) LTG: Pt will ambulate up/down stairs assist needed:: Contact Guard/Touching assist LTG: Pt will  ambulate up and down number of stairs: at least 3 using HR setup as per home environment

## 2023-03-09 NOTE — Progress Notes (Signed)
Occupational Therapy Session Note  Patient Details  Name: Shloma Hugo MRN: [id number] Date of Birth: 1936-04-05  Today's Date: 03/09/2023 OT Individual Time: 7846-9629 OT Individual Time Calculation (min): 74 min  And OT Individual Time: 1300-1339 OT Individual Time Calculation (min): 39 min   Short Term Goals: Week 1:  OT Short Term Goal 1 (Week 1): Pt will complete 3/3 toileting steps with min A using LRAD OT Short Term Goal 2 (Week 1): Pt will tolerate standing 3 minute during BADLs or functional tasks using LRAD with CGA OT Short Term Goal 3 (Week 1): Pt will complete LB dressing wtih min A usign LRAD  Skilled Therapeutic Interventions/Progress Updates:     AM Session:  Pt received semi-reclined in bed with wife present in room. Pt presenting to be upset upon OT arrival stating "I am too tired to do therapy; I have already gotten up this AM and gone to the BR". Provided gentle therapeutic support and education on purpose of therapy and importance of participating in order to increase strength and endurance for return home. Pt's wife also become discouraged and upset by Pt's demeanor and decreased motivation to participate stepping out of the room during session. Spoke with Pt's wife and provided education on impact of Pt's CVA and other medical challenges on his psychosocial health with Pt's wife receptive to education.  Pt's wife reporting she has felt extremely overwhelmed by patient's attitude towards therapy and she is planning to decrease amount of time she is here to allow him to focus his attention more on therapy. Education provided on caregiver burnout and importance of taking breaks and time to herself with Pt's wife receptive to education.  With maximal therapeutic support and motivation, Pt receptive to skilled OT session reporting 0/10 pain- OT offering intermittent rest breaks, repositioning, and therapeutic support to optimize participation in therapy session. Pt  transitioned to EOB using bed features with close supervision for safety. Donned shoes total A for time management. Pt completed stand step EOB>wc with mod A required to power up to standing position and min A for balance during transfer. Positioned Pt at sink for grooming/hygiene tasks to support improved moral and independence in BADLs. Pt able to wash face/neck and brush teeth with supervision- mod verbal cues required for cleanliness and to locate items d/t underlying vision deficit.   Transport Pt total A to therapy gym in wc for energy conservation and time management. Pt completed grip test assessment seated in wc to measure progress and strength improvements during hospital stay. The Dynamometer Grip Strength Test is a quantitative and objective measure of isometric muscular strength of the hand and forearm. -Instructions The patient was asked to sit with their back, pelvis, and knees at 90 degrees. The shoulder was adducted and neutrally rotated with The elbow flexed to 90 degrees and forearm in neutral. The arm was not supported. -Results The score was determined by calculating the average of 3 trials. The pt's average score was 52.33 lbs in the R hand and 51 lbs in the L hand. R (Dominant Hand) Trial 1: 48 Trial 2: 57 Trail 3: 52 Average: 52.33 lbs L (Nondominant Hand) Trial 1: 45 Trial 2: 54 Trial 3: 54 Average: 51 Norms: Males Average in lbs 75+ R 65.7 L 55.0  Engaged Pt in short distance functional mobility training using RW to improve Pt's activity tolerance for completing household distance functional mobility and to decrease overall caregiver bourdon. Pt able to complete ~10 ft functional mobility using RW  with min A required for balance and RW management and mod verbal cues for awareness d/t visual deficit. Pt with decreased step length and mild posterior lean during functional mobility. Transported Pt back to room total A in wc for energy conservation. Pt requesting to  return to bed. Stand step wc>EOB using RW min A. EOB>supine supervision. Pt was left resting in bed with call bell in reach, bed alarm on, and all needs met.    PM Session: Pt received reclined in bed lightly sleeping waking upon OT arrival. Pt presenting to have improvement in moral this session compared to this AM requiring only min therapeutic support to participate in therapy session and with gentle education provided on importance of increasing his strength for d/c home. Pt receptive to skilled OT session reporting 0/10 pain- OT offering intermittent rest breaks, repositioning, and therapeutic support to optimize participation in therapy session. Engaged pt in light hearted conversation and provided increased time for rest breaks to support improved moral throughout session. Pt requesting to stay in his room for therapy session stating "the therapy gyms are too cold". Pt transitioned to EOB with HOB slightly elevated supervision. Engaged Pt in seated UB exercises to increase strength and endurance for BADLs. Pt completed 2x10 reps of the following exercises using a 3# weighted dowel with OT providing tactile and verbal cues for improved muscle engagement, postural alignment, and technique:  -Bicep curls -Shoulder Flexion -Chest Press -Overhead Press Pt reporting fatigue following and requesting to return to bed. EOB>supine supervision. Pt able to pull self up in bed using bed features with min A and mod verbal cues for technique. Pt was left resting in bed with soft call bell in reach, bed alarm on, and all needs met.    Therapy Documentation Precautions:  Precautions Precautions: Fall Precaution Comments: blind, large outer table craniectomy at anterior aspect of head Restrictions Weight Bearing Restrictions: No  Therapy/Group: Individual Therapy  Clide Deutscher 03/09/2023, 7:55 AM

## 2023-03-09 NOTE — Patient Care Conference (Signed)
Inpatient RehabilitationTeam Conference and Plan of Care Update Date: 03/09/2023   Time: 10:53 AM    Patient Name: Douglas Melton      Medical Record Number: [id number]  Date of Birth: Oct 14, 1935 Sex: Male         Room/Bed: 4M11C/4M11C-01 Payor Info: Payor: Advertising copywriter MEDICARE / Plan: [employer] MEDICARE / Product Type: *No Product type* /    Admit Date/Time:  03/07/2023  3:37 PM  Primary Diagnosis:  CVA (cerebral vascular accident) [facility])  Hospital Problems: Principal Problem:   CVA (cerebral vascular accident) [facility])    Expected Discharge Date: Expected Discharge Date: 03/16/23  Team Members Present: Physician leading conference: Dr. Claudette Laws Social Worker Present: Lavera Guise, BSW Nurse Present: Chana Bode, RN PT Present: Casimiro Needle, PT OT Present: Bonnell Public, OT PPS Coordinator present : Fae Pippin, SLP     Current Status/Progress Goal Weekly Team Focus  Bowel/Bladder   incontinent of B&B   timed toileting to continent habits   assist with toileting needs as needed    Swallow/Nutrition/ Hydration               ADL's   supervision-CGA UB bathing/dressing, min A LB dressing/bathing, mod A toileting, mod A ambulatory transfers to toilet using RW; Barriers: self-limiting, poor activity tolerance   supervision-CGA   evaluation, energy conservation, functional transfer training, BADL retraining    Mobility   Bed mobility = supervision/ CGA, transfers = ModA from low heights d/t posterior bias, otherwise min A using RW, Ambulation = limited on eval d/ weakness and fatigue, reaches 6-71ft with RW and MinA for balance/ weakness, progressed to up to 139ft using RW the next day   supervision/ CGA at ambulatory level  Barriers: blindness, very low activity tolerance, generalized weakness /// Work on: general strengthening, standing tolerance/ balance, ambulation, family education    Building services engineer  Observations               Pain   mild pain located on scalp   keep pain managed   assess and manage pain so patient can complete daily tasks    Skin   incision on scalp   maintian wound, change dressing daily and as needed  keep wound infection free      Discharge Planning:  Discharging home with spouse who is retired and able to assist 24/7. Spouse assisting prior due to blindness and HOH. 2 level home, stair lift from basement to main level.   Team Discussion: Patient post CVA with self limiting behaviors, poor activity intolerance and premorbid blindness and HOH.   Patient on target to meet rehab goals: Currently needs CGA for upper body care and min assist for lower body care with mod assist for toileting.  Needs mod assist for stand pivot transfers using a RW. Able to ambulate up to 100' using RW with min assist at the rail. Goals for discharge set for supervision overall and CGA for steps.  *See Care Plan and progress notes for long and short-term goals.   Revisions to Treatment Plan:  Neuro psych referral   Teaching Needs: Safety, skin care, medications, dietary modification, transfers, toileting, etc.   Current Barriers to Discharge: Decreased caregiver support  Possible Resolutions to Barriers: Family education OP follow up services     Medical Summary Current Status: poor motivation, bradycardia  Barriers to Discharge: Behavior/Mood;Renal Insufficiency/Failure;Medical stability;Inadequate Nutritional Intake   Possible Resolutions  to Barriers/Weekly Focus: enc po fluid , reduce metoprolol , may need additional IVF   Continued Need for Acute Rehabilitation Level of Care: The patient requires daily medical management by a physician with specialized training in physical medicine and rehabilitation for the following reasons: Direction of a multidisciplinary physical rehabilitation program to maximize functional independence : Yes Medical management of patient  stability for increased activity during participation in an intensive rehabilitation regime.: Yes Analysis of laboratory values and/or radiology reports with any subsequent need for medication adjustment and/or medical intervention. : Yes   I attest that I was present, lead the team conference, and concur with the assessment and plan of the team.   Chana Bode B 03/09/2023, 2:13 PM

## 2023-03-09 NOTE — Progress Notes (Signed)
PROGRESS NOTE   Subjective/Complaints:  No dizziness with standing , tolerated therapy  Wife feels like she may be aggravating pt.  SHe is trying to motivate pt for therapy ROS- neg CP, SOB, N/V/D Objective:   No results found. Recent Labs    03/08/23 0432  WBC 12.1*  HGB 12.0*  HCT 35.8*  PLT 378   Recent Labs    03/08/23 0432 03/09/23 0434  NA 139 135  K 4.4 3.9  CL 105 104  CO2 27 26  GLUCOSE 131* 140*  BUN 37* 37*  CREATININE 1.81* 1.66*  CALCIUM 8.8* 8.6*    Intake/Output Summary (Last 24 hours) at 03/09/2023 0849 Last data filed at 03/09/2023 0445 Gross per 24 hour  Intake 118 ml  Output 475 ml  Net -357 ml        Physical Exam: Vital Signs Blood pressure 129/67, pulse (!) 59, temperature 97.8 F (36.6 C), temperature source Oral, resp. rate 16, height 6\' 2"  (1.88 m), weight 59.1 kg, SpO2 98%.   General: No acute distress Mood and affect are appropriate Heart: Bradycardia  and rhythm no rubs murmurs or extra sounds Lungs: Clear to auscultation, breathing unlabored, no rales or wheezes Abdomen: Positive bowel sounds, soft nontender to palpation, nondistended Extremities: No clubbing, cyanosis, or edema Skin: No evidence of breakdown, no evidence of rash  Neurologic: Cranial nerves II through XII intact, motor strength is 4/5 in bilateral deltoid, bicep, tricep, grip, hip flexor, knee extensors, ankle dorsiflexor and plantar flexor Sensory exam normal sensation to light touch and proprioception in bilateral upper and lower extremities Cerebellar examcould not perform due to visual impairment  Musculoskeletal: Full range of motion in all 4 extremities. No joint swelling   Assessment/Plan: 1. Functional deficits which require 3+ hours per day of interdisciplinary therapy in a comprehensive inpatient rehab setting. Physiatrist is providing close team supervision and 24 hour management of active  medical problems listed below. Physiatrist and rehab team continue to assess barriers to discharge/monitor patient progress toward functional and medical goals  Care Tool:  Bathing    Body parts bathed by patient: Right arm, Left arm, Chest, Abdomen, Front perineal area, Face, Left upper leg, Right upper leg   Body parts bathed by helper: Buttocks, Left lower leg, Right lower leg     Bathing assist Assist Level: Minimal Assistance - Patient > 75%     Upper Body Dressing/Undressing Upper body dressing   What is the patient wearing?: Pull over shirt    Upper body assist Assist Level: Minimal Assistance - Patient > 75%    Lower Body Dressing/Undressing Lower body dressing      What is the patient wearing?: Pants, Underwear/pull up     Lower body assist Assist for lower body dressing: Moderate Assistance - Patient 50 - 74%     Toileting Toileting    Toileting assist Assist for toileting: Moderate Assistance - Patient 50 - 74%     Transfers Chair/bed transfer  Transfers assist     Chair/bed transfer assist level: Moderate Assistance - Patient 50 - 74%     Locomotion Ambulation   Ambulation assist      Assist level: Minimal  Assistance - Patient > 75% Assistive device: Walker-rolling Max distance: 6 ft   Walk 10 feet activity   Assist  Walk 10 feet activity did not occur: Safety/medical concerns        Walk 50 feet activity   Assist Walk 50 feet with 2 turns activity did not occur: Safety/medical concerns         Walk 150 feet activity   Assist Walk 150 feet activity did not occur: Safety/medical concerns         Walk 10 feet on uneven surface  activity   Assist Walk 10 feet on uneven surfaces activity did not occur: Safety/medical concerns         Wheelchair     Assist Is the patient using a wheelchair?: Yes Type of Wheelchair: Manual Wheelchair activity did not occur: Safety/medical concerns         Wheelchair 50  feet with 2 turns activity    Assist    Wheelchair 50 feet with 2 turns activity did not occur: Safety/medical concerns       Wheelchair 150 feet activity     Assist  Wheelchair 150 feet activity did not occur: Safety/medical concerns       Blood pressure 129/67, pulse (!) 59, temperature 97.8 F (36.6 C), temperature source Oral, resp. rate 16, height 6\' 2"  (1.88 m), weight 59.1 kg, SpO2 98%.  Medical Problem List and Plan: 1. Functional deficits secondary to bilateral frontal and parietal lobe infarcts as well as cerebral amyloid angiopathy             -patient may shower             -ELOS/Goals: 9-11 days, supervision goals   2.  Antithrombotics: -DVT/anticoagulation:  Mechanical:  Antiembolism stockings, knee (TED hose) Bilateral lower extremities             -antiplatelet therapy: aspirin 81 mg daily. No AC due to cerebral amyloid angiopathy   3. Pain Management: Tylenol as needed   4. Mood/Behavior/Sleep: LCSW to evaluate and provide emotional support             -antipsychotic agents: n/a   5. Neuropsych/cognition: This patient is capable of making decisions on his own behalf.   6. Skin/Wound Care: Routine skin care checks            See above vaseline and xeroform gauze to prevent large scalp wound from drying    7. 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    11: Hypothyroidism: continue Synthroid   12: DM-2: A1C = 5.8% (home glipizide discontinued)             -dc SSI now and give Ensure with meals             -carb modified diet   13: CKD stage III: Baseline creatinine ~1.4; stable          Latest Ref Rng & Units 03/09/2023    4:34 AM 03/08/2023    4:32 AM  03/05/2023    4:39 AM  BMP  Glucose 70 - 99 mg/dL 161  096  045   BUN 8 - 23 mg/dL 37  37  17   Creatinine 0.61 - 1.24 mg/dL 5.78  4.69  6.29   Sodium 135 - 145 mmol/L 135  139  139   Potassium 3.5 - 5.1 mmol/L 3.9  4.4  3.9   Chloride 98 - 111 mmol/L 104  105  104   CO2 22 - 32 mmol/L 26  27  27    Calcium 8.9 - 10.3 mg/dL 8.6  8.8  9.2   Creat improved after fluid bolus , no signs of overload      14: SSC scalp s/p resection: remove dressing 11/12>>recommendation to cut sutures of bolster and to remove bolster; synthetic matrix underlying should stay in place and be kept moist with application of Xeroform gauze and petroleum jelly daily Done per Dois Davenport PA yesterday appreciate image              -follow-up with Dr. Christoper Allegra (phone [phone] if questions re: dressing)  No drainage on Xeroform 11/13 15: Sinus tachycardia with PACs; atrial fib ruled out             -on metoprolol 25 mg BID- now bradycardic - reduce to 12.5mg  BID   16: Anemia of chronic disease/? iron deficiency: continue oral iron             -follow-up CBC- resolved     Latest Ref Rng & Units 03/08/2023    4:32 AM 03/05/2023    4:39 AM 03/02/2023    4:40 AM  CBC  WBC 4.0 - 10.5 K/uL 12.1  10.8  10.7   Hemoglobin 13.0 - 17.0 g/dL 10.2  72.5  36.6   Hematocrit 39.0 - 52.0 % 35.8  37.1  37.6   Platelets 150 - 400 K/uL 378  309  309       LOS: 2 days A FACE TO FACE EVALUATION WAS PERFORMED  Victorino Sparrow Carol Theys 03/09/2023, 8:49 AM

## 2023-03-09 NOTE — Progress Notes (Addendum)
Patient ID: Douglas Melton, male   DOB: 1936/02/15, 87 y.o.   MRN: [id number]  Team Conference Report to Patient/Family  Team Conference discussion was reviewed with the patient and caregiver, including goals, any changes in plan of care and target discharge date.  Patient and caregiver express understanding and are in agreement.  The patient has a target discharge date of 03/16/23.  SW met with patient and spouse in the room and provided team conference updates. Patient adjusted to 15/7 with therapy schedule. Pt and spouse agreeable with discharge next week. Patient added to neuro psych list. OP recommendation. No additional questions or concerns.  Andria Rhein 03/09/2023, 2:05 PM

## 2023-03-09 NOTE — Progress Notes (Signed)
Physical Therapy Session Note  Patient Details  Name: Douglas Melton MRN: [id number] Date of Birth: 1936/04/20  Today's Date: 03/09/2023 PT Individual Time: 4098-1191 and 4782-9562 PT Individual Time Calculation (min): 40 min and 49 min  Short Term Goals: Week 1:  PT Short Term Goal 1 (Week 1): Pt will perform bed mobility with overall supervision to both sides of bed. PT Short Term Goal 2 (Week 1): pt will perform all standing transfers with CGA and LRAD PT Short Term Goal 3 (Week 1): Pt will ambulate at least 30 ft using LRAD and CGA. PT Short Term Goal 4 (Week 1): Pt will initiate stair training.  Skilled Therapeutic Interventions/Progress Updates:    Session 1: Pt received in bed, alarm activated, agreeable to PT, nursing present donning dressing on scalp.  Therapy retrieved funnel for BSC d/t pt needing a higher commode given his tall stature. As well as additional foot plate (only 1 in room).  Bed mobility: CGA for safety w/ HOB partially elevated. Verbal cues for sit to supine to prevent pt from hitting head on bed rail.  STS: modA x1 but progressed to minA during session. Pt performed from EOB & from WC. Use of armrest & RW to assist. Verbal cues to inform pt when RW is in front of him & ready to stand.  Gait training: minA, verbal cues for directions d/t blind visual deficits, RW - 60 ft + 80 ft Pt ambulates w/ the following gait deviations: decreased velocity, decreased step height B, and decreased stride length.  minA to bridge in bed and scoot up to [facility]. Limited distance requiring additional totalA +2 to transfer up to [facility].  Pt left in bed, alarm activated, all needs met.  Session 2:Pt received in bed, alarm activated, agreeable to PT.  Bed mobility: CGA for safety, bed flat, setup assist w/ for donning B shoes.  STS: minA, use of seat surface or armrests to assist power up, RW once in standing, Pt demo R lateral lean into therapist & reported PT was pushing him over.  PT educated that he was leaning but pt asked for PT to let go of him. PT began CGA & pt was able to self correct posture. Pt requested no other CGA throughout session but also did not demo increased R trunk lean during any other STS.  Bed <> chair transfer: minA w/ verbal cues for directions. Can perform w/ & w/o AD.  TUG: 42 sec w/ RW & verbal cues for directions, minA 5xSTS: 16 sec, CGA w/ armrests to assist  Gait training: - 80 ft x3, minA w/ R HHA on therapists bicep, w/ PT leading him to match how pt would ambulate in environments w/ his wife, verbal cues for directions, hand over hand assistance to locate seat prior to sitting down. +2 CGA for safety d/t imbalance.(x1 80 ft w/ R turns through doorways, x1 80 ft w/ L turns through doorways, x1 80 ft straight w/ 2 180 degree turns) - Pt demo same gait deviations as morning session w/ additional mild R trunk lean especially during R turns.  Pt left in bed, alarm activated, all needs met.  Therapy Documentation Precautions:  Precautions Precautions: Fall Precaution Comments: blind, large outer table craniectomy at anterior aspect of head Restrictions Weight Bearing Restrictions: No Pain:  Session 1: Pt presents with no pain during session. Session 2: Pt presents with no pain during session.  Therapy/Group: Individual Therapy  Gilman Buttner 03/09/2023, 9:50 AM

## 2023-03-09 NOTE — Evaluation (Signed)
Physical Therapy Assessment and Plan  Patient Details  Name: Douglas Melton MRN: [id number] Date of Birth: January 31, 1936  PT Diagnosis: Difficulty walking and Muscle weakness Rehab Potential: Fair ELOS: 10-14 days   Today's Date: 03/08/2023 PT Individual Time:  0907-1004  PT Individual Time Calculation (min): 57 min  Hospital Problem: Principal Problem:   CVA (cerebral vascular accident) [facility])   Past Medical History:  Past Medical History:  Diagnosis Date   Chronic kidney disease    Diabetes mellitus without complication (HCC)    Glaucoma    Hyperlipidemia    Hypertension    Past Surgical History:  Past Surgical History:  Procedure Laterality Date   CHOLECYSTECTOMY     TOTAL HIP ARTHROPLASTY Left 02/15/2020   Procedure: TOTAL HIP ARTHROPLASTY  ANTERIOR APPROACH;  Surgeon: Samson Frederic, MD;  Location: [facility] OR;  Service: Orthopedics;  Laterality: Left;    Assessment & Plan Clinical Impression: Patient is a 87 y.o. male who presented to the emergency department on 03/01/2023 complaining of seizure-like activity exhibited by right upper extremity jerking movements.  He then described right upper extremity weakness.  He has a history of squamous cell carcinoma of the scalp and had undergone resection the day before by Dr. Christoper Allegra.  No history of seizure disorder.  Consulted with neurology and loaded with Keppra intravenously. LTM EEG negative for seizures. 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  CTA head and neck with moderate stenosis in both ICAs, right ACA and right PCA.  Severe stenosis of left vertebral artery and 60-65 stenosis of the right ICA.  2D echo with EF of 50 to 55%.  Given the CAA pattern and MRI neurology did not feel the patient is a candidate for anticoagulation and would continue aspirin 81 mg daily.  Hemoglobin A1c 5.3%.  Telemetry showed sinus tachycardia with PACs.  There was initial  concern for atrial fibrillation but EKG was not consistent with this.  Cardiology was consulted and reviewed clinical course and agreed no diagnosis of atrial fibrillation.  Receiving metoprolol tartrate 25 mg twice daily.  Started on Crestor 10 mg daily.  Neurology does not recommend high intensity statin due to LDL near goal and pattern of CAA on MRI.  Patient has a history of diabetes mellitus type 2 controlled with glipizide at home.  This has been discontinued.  His past medical history is also significant for chronic kidney disease stage IIIa, hypothyroidism, bilateral blindness. Patient requiring min A for step pivot transfer chair>EOB and able to progress short gait away from and back to EOB with min A and HHA on R. Tolerating diet. The patient requires inpatient medicine and rehabilitation evaluations and services for ongoing dysfunction secondary to acute bilateral embolic infarcts.   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 Recommendation for dressing removal on 11/12. Per discussion with outpatient neurosurgeon, Dr. Christoper Allegra, recommendation to cut sutures of bolster and to remove bolster; synthetic matrix underlying should stay in place and be kept moist with application of Xeroform gauze and petroleum jelly daily. Information relayed to inpatient rehab team including Dr. Anselmo Rod number for step-by-step walkthrough. Patient transferred to CIR on 03/07/2023 .   Patient currently requires mod assist with mobility secondary to muscle weakness, decreased cardiorespiratoy endurance, decreased visual acuity, decreased midline orientation, and decreased standing balance and decreased balance strategies.  Prior to hospitalization, patient  was modified independent  with mobility and lived with Spouse in a House home.  Home access is stair lift from basement to main levelOther (comment).  Patient will  benefit from skilled PT intervention to maximize safe functional mobility, minimize fall risk, and decrease caregiver burden for planned discharge home with 24 hour assist.  Anticipate patient will benefit from follow up [facility] at discharge.  PT - End of Session Activity Tolerance: Tolerates < 10 min activity, no significant change in vital signs Endurance Deficit: Yes PT Assessment Rehab Potential (ACUTE/IP ONLY): Fair PT Barriers to Discharge: Decreased caregiver support;Home environment access/layout;Insurance for SNF coverage;Weight;Nutrition means PT Patient demonstrates impairments in the following area(s): Balance;Endurance;Motor;Nutrition;Safety;Sensory;Skin Integrity PT Transfers Functional Problem(s): Bed Mobility;Bed to Chair;Car;Furniture PT Locomotion Functional Problem(s): Ambulation;Stairs;Wheelchair Mobility PT Plan PT Intensity: Minimum of 1-2 x/day ,45 to 90 minutes PT Frequency: 5 out of 7 days PT Duration Estimated Length of Stay: 10-14 days PT Treatment/Interventions: Ambulation/gait training;Community reintegration;DME/adaptive equipment instruction;Neuromuscular re-education;Psychosocial support;Stair training;UE/LE Strength taining/ROM;Wheelchair propulsion/positioning;Balance/vestibular training;Discharge planning;Pain management;Skin care/wound management;Therapeutic Activities;UE/LE Coordination activities;Visual/perceptual remediation/compensation;Therapeutic Exercise;Splinting/orthotics;Patient/family education;Functional mobility training;Disease management/prevention;Cognitive remediation/compensation PT Transfers Anticipated Outcome(s): supervision PT Locomotion Anticipated Outcome(s): supervision/ CGA PT Recommendation Follow Up Recommendations: Home health PT;24 hour supervision/assistance Patient destination: Home Equipment Recommended: To be determined   PT Evaluation Precautions/Restrictions Precautions Precautions: Fall Precaution Comments: blind, large  outer table craniectomy at anterior aspect of head Restrictions Weight Bearing Restrictions: No General   Vital Signs  Pain Pain Assessment Pain Scale: 0-10 Pain Score: 0-No pain Pain Interference Pain Interference Pain Effect on Sleep: 1. Rarely or not at all Pain Interference with Therapy Activities: 1. Rarely or not at all Pain Interference with Day-to-Day Activities: 1. Rarely or not at all Home Living/Prior Functioning Home Living Available Help at Discharge: Family Type of Home: House Home Access: Other (comment) Entrance Stairs-Number of Steps: stair lift from basement to main level Home Layout: Multi-level;Able to live on main level with bedroom/bathroom Alternate Level Stairs-Number of Steps: 3 steps at front door Alternate Level Stairs-Rails: Left Bathroom Shower/Tub: Walk-in shower (2 in threshold) Bathroom Toilet: Handicapped height Bathroom Accessibility: Yes  Lives With: Spouse Prior Function Level of Independence: Independent with basic ADLs;Independent with homemaking with ambulation;Independent with gait;Independent with transfers;Requires assistive device for independence  Able to Take Stairs?: Yes Driving: No 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 [place] Vision/Perception  Vision - History Ability to See in Adequate Light: 4 Severely impaired (R eye no vision; L eye can see light only) Vision - Assessment Additional Comments: reports is able to see bright light but cannot identify items in visual field Perception Perception: Within Functional Limits Praxis Praxis: WFL  Cognition Overall Cognitive Status: Within Functional Limits for tasks assessed Arousal/Alertness: Awake/alert Orientation Level: Oriented X4 Year: 2024 Month: November Day of Week: Incorrect Memory: Appears intact Awareness: Appears intact Problem Solving: Appears intact Safety/Judgment: Appears  intact Sensation Sensation Light Touch: Appears Intact Coordination Gross Motor Movements are Fluid and Coordinated: No Fine Motor Movements are Fluid and Coordinated: Yes Coordination and Movement Description: Slowed gait speed with significantly decreased step length/ height d/t general deconditioning Motor  Motor Motor: Other (comment) (debility) Motor - Skilled Clinical Observations: gross weakness d/t general deconditioning with signifcant endurance deficit   Trunk/Postural Assessment  Cervical Assessment Cervical Assessment: Exceptions to [facility] (forward head) Thoracic Assessment Thoracic Assessment: Exceptions to [facility] (kyphotic posturing with rounded shoulders) Lumbar Assessment Lumbar Assessment: Exceptions to [facility] (decreased lordotic curve) Postural Control Postural Control: Deficits  on evaluation Trunk Control: slight R lean while seated EOB, requires significant effort to maintain midline with lift of LLE when seated  Balance Balance Balance Assessed: Yes Static Sitting Balance Static Sitting - Balance Support: Feet supported;Bilateral upper extremity supported Static Sitting - Level of Assistance: 5: Stand by assistance Dynamic Sitting Balance Dynamic Sitting - Balance Support: Feet supported;No upper extremity supported Dynamic Sitting - Level of Assistance: 5: Stand by assistance;Other (comment) (CGA) Static Standing Balance Static Standing - Balance Support: During functional activity;Bilateral upper extremity supported Static Standing - Level of Assistance: 4: Min assist;3: Mod assist Dynamic Standing Balance Dynamic Standing - Balance Support: During functional activity;Bilateral upper extremity supported Dynamic Standing - Level of Assistance: 3: Mod assist Extremity Assessment      RLE Assessment RLE Assessment: Exceptions to [facility] RLE Strength RLE Overall Strength: Deficits Right Hip Flexion: 3+/5 Right Hip Extension: 3+/5 Right Hip ABduction: 4-/5 Right  Hip ADduction: 3+/5 Right Knee Flexion: 4-/5 Right Knee Extension: 3/5 Right Ankle Dorsiflexion: 4-/5 Right Ankle Plantar Flexion: 4-/5 LLE Assessment LLE Assessment: Exceptions to WFL LLE Strength LLE Overall Strength: Deficits Left Hip Flexion: 4-/5 Left Hip Extension: 4-/5 Left Hip ABduction: 4-/5 Left Hip ADduction: 4/5 Left Knee Flexion: 4/5 Left Knee Extension: 4-/5 Left Ankle Dorsiflexion: 4-/5 Left Ankle Plantar Flexion: 4/5  Care Tool Care Tool Bed Mobility Roll left and right activity   Roll left and right assist level: Supervision/Verbal cueing    Sit to lying activity   Sit to lying assist level: Contact Guard/Touching assist    Lying to sitting on side of bed activity   Lying to sitting on side of bed assist level: the ability to move from lying on the back to sitting on the side of the bed with no back support.: Supervision/Verbal cueing (significant effort)     Care Tool Transfers Sit to stand transfer        Chair/bed transfer   Chair/bed transfer assist level: Moderate Assistance - Patient 50 - 74%    Car transfer Car transfer activity did not occur: Safety/medical concerns        Care Tool Locomotion Ambulation   Assist level: Minimal Assistance - Patient > 75% Assistive device: Walker-rolling Max distance: 6 ft  Walk 10 feet activity Walk 10 feet activity did not occur: Safety/medical concerns       Walk 50 feet with 2 turns activity Walk 50 feet with 2 turns activity did not occur: Safety/medical concerns      Walk 150 feet activity Walk 150 feet activity did not occur: Safety/medical concerns      Walk 10 feet on uneven surfaces activity Walk 10 feet on uneven surfaces activity did not occur: Safety/medical concerns      Stairs Stair activity did not occur: Safety/medical concerns        Walk up/down 1 step activity Walk up/down 1 step or curb (drop down) activity did not occur: Safety/medical concerns      Walk up/down 4 steps  activity Walk up/down 4 steps activity did not occur: Safety/medical concerns      Walk up/down 12 steps activity Walk up/down 12 steps activity did not occur: Safety/medical concerns      Pick up small objects from floor Pick up small object from the floor (from standing position) activity did not occur: Safety/medical concerns      Wheelchair Is the patient using a wheelchair?: Yes Type of Wheelchair: Manual Wheelchair activity did not occur: Safety/medical concerns  Wheel 50 feet with 2 turns activity Wheelchair 50 feet with 2 turns activity did not occur: Safety/medical concerns    Wheel 150 feet activity Wheelchair 150 feet activity did not occur: Safety/medical concerns      Refer to Care Plan for Long Term Goals  SHORT TERM GOAL WEEK 1 PT Short Term Goal 1 (Week 1): Pt will perform bed mobility with overall supervision to both sides of bed. PT Short Term Goal 2 (Week 1): pt will perform all standing transfers with CGA and LRAD PT Short Term Goal 3 (Week 1): Pt will ambulate at least 30 ft using LRAD and CGA. PT Short Term Goal 4 (Week 1): Pt will initiate stair training.  Recommendations for other services: None   Skilled Therapeutic Intervention Mobility Bed Mobility Bed Mobility: Rolling Right;Rolling Left;Supine to Sit;Sit to Supine Rolling Right: Supervision/verbal cueing Rolling Left: Supervision/Verbal cueing Supine to Sit: Supervision/Verbal cueing Sit to Supine: Contact Guard/Touching assist Transfers Transfers: Sit to Stand;Stand to Sit;Stand Pivot Transfers Sit to Stand: Moderate Assistance - Patient 50-74% Stand to Sit: Moderate Assistance - Patient 50-74% Stand Pivot Transfers: Moderate Assistance - Patient 50 - 74% Stand Pivot Transfer Details: Tactile cues for sequencing;Tactile cues for placement;Verbal cues for technique;Verbal cues for safe use of DME/AE Transfer (Assistive device): Rolling walker Locomotion  Gait Ambulation: Yes Gait  Assistance: Minimal Assistance - Patient > 75% Gait Distance (Feet): 6 Feet Assistive device: Rolling walker Gait Gait: Yes Gait Pattern: Step-to pattern;Decreased step length - right;Decreased hip/knee flexion - right;Decreased hip/knee flexion - left;Trunk flexed;Narrow base of support Gait velocity: significantly decreased Wheelchair Mobility Wheelchair Mobility: No  Skilled Intervention: PT Evaluation completed; see above for results. PT educated patient in roles of PT vs OT, PT POC, rehab potential, rehab goals, and discharge recommendations along with recommendation for follow-up rehabilitation services. Individual treatment initiated:  Patient supine in bed upon PT arrival. Patient alert and agreeable to PT session. No pain complaint during session. RN present and apologizing re: confusion over breakfast. Pt was told he could not have breakfast without supervision. This therapist informed RN that pt does not currently have ST orders and should be fine to eat meals especially if wife is present to assist with setup.   Therapeutic Activity: Bed Mobility: Patient performed supine to/from sit with supervision but required significant effort. Provided vc for technique. Pt and wife relate that pt normally gets in/ out of L side of bed at home. Informed that he will practice to both sides during therapy sessions, however room is currently set up to get in/ out of R side of bed.   While seated EOB, pt is able to don socks with extra time and supervision when provided with them. Is able to don slip-on non-skid slippers as well. Pt prefers his socks over hospital socks and related to pt that as long as he has his slippers here, he should be able to wear his socks instead of hospital socks.  Transfers: Patient performed sit <> stand transfers requiring elevated bed height and minA from height. Provided vc/tc for increasing forward lean/ hip hinge as pt tends to maintain posterior bias throughout. Pt  initially not wanting to stand without RW and requests therapist not to push him forward or back. With posterior bias, pt requires block behind shoulders/ ribcage to prevent LOB and pt states, "you're pushing me forward". Informed pt that he was starting to fall backward and required block to prevent LOB. Cued to bring weight forward with movement of  head and shoulders anteriorly which pt is unable to perform.   Gait Training:  Patient ambulated 6 feet forward from EOB using RW with Min/ ModA. Ambulated with very slow pace as well as very short steps. Relates he did "not walk like this before". When fatigued, pt encouraged to ambulate backwards to return to bed and is able to perform with Min/ ModA for balance. Instructed to side step toward [facility] but pt turning to [facility] instead.   Once seated on EOB, pt can scoot laterally to R, however requires significant energy expenditure. Very fatigued on return to supine requiring CGA to complete. Provided vc/tc for initiating mobility, most likely d/t vision loss.  Patient supine in bed at end of session with brakes locked, bed alarm set, and all needs within reach. PA and RN present to setup wound care for craniectomy.    Discharge Criteria: Patient will be discharged from PT if patient refuses treatment 3 consecutive times without medical reason, if treatment goals not met, if there is a change in medical status, if patient makes no progress towards goals or if patient is discharged from hospital.  The above assessment, treatment plan, treatment alternatives and goals were discussed and mutually agreed upon: by patient and by family  Loel Dubonnet PT, DPT, CSRS 03/08/2023, 4:45 PM

## 2023-03-10 DIAGNOSIS — I6340 Cerebral infarction due to embolism of unspecified cerebral artery: Secondary | ICD-10-CM | POA: Diagnosis not present

## 2023-03-10 DIAGNOSIS — I680 Cerebral amyloid angiopathy: Secondary | ICD-10-CM | POA: Diagnosis not present

## 2023-03-10 DIAGNOSIS — D044 Carcinoma in situ of skin of scalp and neck: Secondary | ICD-10-CM | POA: Diagnosis not present

## 2023-03-10 DIAGNOSIS — H409 Unspecified glaucoma: Secondary | ICD-10-CM | POA: Diagnosis not present

## 2023-03-10 NOTE — Progress Notes (Signed)
Occupational Therapy Session Note  Patient Details  Name: Douglas Melton MRN: [id number] Date of Birth: July 27, 1935  Today's Date: 03/10/2023 OT Individual Time: 4098-1191 OT Individual Time Calculation (min): 45 min  and Today's Date: 03/10/2023 OT Missed Time: 60 Minutes Missed Time Reason: Patient fatigue;Patient unwilling/refused to participate without medical reason   Short Term Goals: Week 1:  OT Short Term Goal 1 (Week 1): Pt will complete 3/3 toileting steps with min A using LRAD OT Short Term Goal 2 (Week 1): Pt will tolerate standing 3 minute during BADLs or functional tasks using LRAD with CGA OT Short Term Goal 3 (Week 1): Pt will complete LB dressing wtih min A usign LRAD  Skilled Therapeutic Interventions/Progress Updates:     AM Session:  Pt received sem-reclined in bed presenting to be tired, receptive to skilled OT session reporting 0/10 pain- OT offering intermittent rest breaks, repositioning, and therapeutic support to optimize participation in therapy session. Focus this session BADL retraining with emphasis on activity tolerance. Pt transitioned to EOB using bed features with increased time and mod therapeutic support. Pt self limiting stating "I am too tired to do all this that you are asking me to do" with therapeutic support provided. Pt initially declining BADLs, however with increased encouragement and education on importance of personal hygiene and participating in therapy sessions to increase independence for d/c home, Pt receptive to completing sponge bath. Sponge bath completed EOB for increased dynamic balance challenge with Pt able to complete UB bathing with supervision and LB bathing min A to wash bottom. Pt donned shirt with assistance to bring shirt over his head to avoid shirt getting caught on bandages. Weaved feet sitting EOB CGA and brought pants to waist in standing with min A provided for balance. Pt completed >4 sit<>stands during bathing/dressing tasks  using RW with min A to CGA required to power up into standing position. Decreased hip extension noted in standing position with mod verbal and tactile cues required to correct. Pt completed stand step using RW with light min A provided for balance. Positioned Pt at sink for grooming/hygiene tasks with Pt able to complete with supervision and mod verbal/tactile cues to locate items. Pt was left resting in wc with call bell in reach, seatbelt alarm on, and all needs met.    PM Session:  Attempted to see Pt for PM OT session, however Pt resting in bed refusing to participate in session despite maximal therapeutic support and encouragement provided. Missed 60 minutes skilled OT treatment. Will attempt to make up time as schedule allows.   Therapy Documentation Precautions:  Precautions Precautions: Fall Precaution Comments: blind, large outer table craniectomy at anterior aspect of head Restrictions Weight Bearing Restrictions: No  Therapy/Group: Individual Therapy  Clide Deutscher 03/10/2023, 11:11 AM

## 2023-03-10 NOTE — Progress Notes (Signed)
PROGRESS NOTE   Subjective/Complaints:  No issues overnite  Discussed d/c date with pt   ROS- neg CP, SOB, N/V/D Objective:   No results found. Recent Labs    03/08/23 0432  WBC 12.1*  HGB 12.0*  HCT 35.8*  PLT 378   Recent Labs    03/08/23 0432 03/09/23 0434  NA 139 135  K 4.4 3.9  CL 105 104  CO2 27 26  GLUCOSE 131* 140*  BUN 37* 37*  CREATININE 1.81* 1.66*  CALCIUM 8.8* 8.6*    Intake/Output Summary (Last 24 hours) at 03/10/2023 0703 Last data filed at 03/10/2023 0603 Gross per 24 hour  Intake --  Output 375 ml  Net -375 ml        Physical Exam: Vital Signs Blood pressure (!) 149/65, pulse (!) 53, temperature 98 F (36.7 C), temperature source Oral, resp. rate 18, height 6\' 2"  (1.88 m), weight 59.1 kg, SpO2 100%.  Oriented to person place month and year but not day and date  General: No acute distress Mood and affect are appropriate Heart: Bradycardia  , irreg irreg and rhythm no rubs murmurs or extra sounds Lungs: Clear to auscultation, breathing unlabored, no rales or wheezes Abdomen: Positive bowel sounds, soft nontender to palpation, nondistended Extremities: No clubbing, cyanosis, or edema Skin: No evidence of breakdown, no evidence of rash  Neurologic: Cranial nerves II through XII intact, motor strength is 4/5 in bilateral deltoid, bicep, tricep, grip, hip flexor, knee extensors, ankle dorsiflexor and plantar flexor Sensory exam normal sensation to light touch and proprioception in bilateral upper and lower extremities Cerebellar examcould not perform due to visual impairment  Musculoskeletal: Full range of motion in all 4 extremities. No joint swelling   Assessment/Plan: 1. Functional deficits which require 3+ hours per day of interdisciplinary therapy in a comprehensive inpatient rehab setting. Physiatrist is providing close team supervision and 24 hour management of active medical  problems listed below. Physiatrist and rehab team continue to assess barriers to discharge/monitor patient progress toward functional and medical goals  Care Tool:  Bathing    Body parts bathed by patient: Right arm, Left arm, Chest, Abdomen, Front perineal area, Face, Left upper leg, Right upper leg   Body parts bathed by helper: Buttocks, Left lower leg, Right lower leg     Bathing assist Assist Level: Minimal Assistance - Patient > 75%     Upper Body Dressing/Undressing Upper body dressing   What is the patient wearing?: Pull over shirt    Upper body assist Assist Level: Minimal Assistance - Patient > 75%    Lower Body Dressing/Undressing Lower body dressing      What is the patient wearing?: Pants, Underwear/pull up     Lower body assist Assist for lower body dressing: Moderate Assistance - Patient 50 - 74%     Toileting Toileting    Toileting assist Assist for toileting: Moderate Assistance - Patient 50 - 74%     Transfers Chair/bed transfer  Transfers assist     Chair/bed transfer assist level: Moderate Assistance - Patient 50 - 74%     Locomotion Ambulation   Ambulation assist      Assist level:  Minimal Assistance - Patient > 75% Assistive device: Walker-rolling Max distance: 6 ft   Walk 10 feet activity   Assist  Walk 10 feet activity did not occur: Safety/medical concerns        Walk 50 feet activity   Assist Walk 50 feet with 2 turns activity did not occur: Safety/medical concerns         Walk 150 feet activity   Assist Walk 150 feet activity did not occur: Safety/medical concerns         Walk 10 feet on uneven surface  activity   Assist Walk 10 feet on uneven surfaces activity did not occur: Safety/medical concerns         Wheelchair     Assist Is the patient using a wheelchair?: Yes Type of Wheelchair: Manual Wheelchair activity did not occur: Safety/medical concerns         Wheelchair 50 feet with  2 turns activity    Assist    Wheelchair 50 feet with 2 turns activity did not occur: Safety/medical concerns       Wheelchair 150 feet activity     Assist  Wheelchair 150 feet activity did not occur: Safety/medical concerns       Blood pressure (!) 149/65, pulse (!) 53, temperature 98 F (36.7 C), temperature source Oral, resp. rate 18, height 6\' 2"  (1.88 m), weight 59.1 kg, SpO2 100%.  Medical Problem List and Plan: 1. Functional deficits secondary to bilateral frontal and parietal lobe infarcts as well as cerebral amyloid angiopathy             -patient may shower             -ELOS/Goals: 11/20, supervision goals   2.  Antithrombotics: -DVT/anticoagulation:  Mechanical:  Antiembolism stockings, knee (TED hose) Bilateral lower extremities             -antiplatelet therapy: aspirin 81 mg daily. No AC due to cerebral amyloid angiopathy   3. Pain Management: Tylenol as needed   4. Mood/Behavior/Sleep: LCSW to evaluate and provide emotional support             -antipsychotic agents: n/a   5. Neuropsych/cognition: This patient is capable of making decisions on his own behalf.   6. Skin/Wound Care: Routine skin care checks            See above vaseline and xeroform gauze to prevent large scalp wound from drying    7. 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    11: Hypothyroidism: continue Synthroid   12: DM-2: A1C = 5.8% (home glipizide discontinued)             -dc SSI now and give Ensure with meals             -carb modified diet   13: CKD stage III: Baseline creatinine ~1.4; stable          Latest Ref Rng & Units 03/09/2023    4:34 AM 03/08/2023    4:32 AM 03/05/2023     4:39 AM  BMP  Glucose 70 - 99 mg/dL 102  725  366   BUN 8 - 23 mg/dL 37  37  17   Creatinine 0.61 - 1.24 mg/dL 3.08  6.57  8.46   Sodium 135 - 145 mmol/L 135  139  139   Potassium 3.5 - 5.1 mmol/L 3.9  4.4  3.9   Chloride 98 - 111 mmol/L 104  105  104   CO2 22 - 32 mmol/L 26  27  27    Calcium 8.9 - 10.3 mg/dL 8.6  8.8  9.2   Creat improved after fluid bolus , no signs of overload recheck Monday unless pt has orthostasis     14: SSC scalp s/p resection: remove dressing 11/12>>recommendation to cut sutures of bolster and to remove bolster; synthetic matrix underlying should stay in place and be kept moist with application of Xeroform gauze and petroleum jelly daily Done per Dois Davenport PA yesterday appreciate image              -follow-up with Dr. Christoper Allegra (phone [phone] if questions re: dressing)  No drainage on Xeroform 11/13 15: Sinus tachycardia with PACs; atrial fib ruled out             -on metoprolol 25 mg BID- now bradycardic - reduce to 12.5mg  BID Vitals:   03/09/23 1943 03/10/23 0502  BP:  (!) 149/65  Pulse: 61 (!) 53  Resp:  18  Temp:  98 F (36.7 C)  SpO2:  100%    16: Anemia of chronic disease/? iron deficiency: continue oral iron             -follow-up CBC- resolved     Latest Ref Rng & Units 03/08/2023    4:32 AM 03/05/2023    4:39 AM 03/02/2023    4:40 AM  CBC  WBC 4.0 - 10.5 K/uL 12.1  10.8  10.7   Hemoglobin 13.0 - 17.0 g/dL 24.4  01.0  27.2   Hematocrit 39.0 - 52.0 % 35.8  37.1  37.6   Platelets 150 - 400 K/uL 378  309  309       LOS: 3 days A FACE TO FACE EVALUATION WAS PERFORMED  Douglas Melton 03/10/2023, 7:03 AM

## 2023-03-10 NOTE — IPOC Note (Addendum)
Overall Plan of Care [facility]) Patient Details Name: Douglas Melton MRN: [id number] DOB: 03-Apr-1936  Admitting Diagnosis: CVA (cerebral vascular accident) [facility])  Hospital Problems: Principal Problem:   CVA (cerebral vascular accident) [facility])     Functional Problem List: Nursing Bowel, Endurance, Medication Management, Motor, Safety, Sensory, Skin Integrity, Bladder  PT Balance, Endurance, Motor, Nutrition, Safety, Sensory, Skin Integrity  OT Balance, Endurance, Motor  SLP    TR         Basic ADL's: OT Bathing, Dressing, Toileting, Grooming     Advanced  ADL's: OT       Transfers: PT Bed Mobility, Bed to Chair, Car, Occupational psychologist, Research scientist (life sciences): PT Ambulation, Educational psychologist, Psychologist, prison and probation services     Additional Impairments: OT    SLP        TR      Anticipated Outcomes Item Anticipated Outcome  Self Feeding Set-up  Swallowing      Basic self-care  Supervision  Toileting  CGA   Bathroom Transfers CGA  Bowel/Bladder  regain continence of urine with resoluton of constipation  Transfers  supervision  Locomotion  supervision/ CGA  Communication     Cognition     Pain  less than 3  Safety/Judgment  with cueing   Therapy Plan: PT Intensity: Minimum of 1-2 x/day ,45 to 90 minutes PT Frequency: 5 out of 7 days PT Duration Estimated Length of Stay: 10-14 days OT Intensity: Minimum of 1-2 x/day, 45 to 90 minutes OT Frequency: 5 out of 7 days OT Duration/Estimated Length of Stay: 1-2 weeks     Team Interventions: Nursing Interventions Patient/Family Education, Bladder Management, Bowel Management, Skin Care/Wound Management, Discharge Planning, Psychosocial Support  PT interventions Ambulation/gait training, Community reintegration, DME/adaptive equipment instruction, Neuromuscular re-education, Psychosocial support, Stair training, UE/LE Strength taining/ROM, Wheelchair propulsion/positioning, Warden/ranger, Discharge planning,  Pain management, Skin care/wound management, Therapeutic Activities, UE/LE Coordination activities, Visual/perceptual remediation/compensation, Therapeutic Exercise, Splinting/orthotics, Patient/family education, Functional mobility training, Disease management/prevention, Cognitive remediation/compensation  OT Interventions Balance/vestibular training, Discharge planning, Pain management, Self Care/advanced ADL retraining, Therapeutic Activities, UE/LE Coordination activities, Cognitive remediation/compensation, Disease mangement/prevention, Functional mobility training, Patient/family education, Skin care/wound managment, Therapeutic Exercise, Community reintegration, DME/adaptive equipment instruction, Neuromuscular re-education, Splinting/orthotics, Psychosocial support, UE/LE Strength taining/ROM  SLP Interventions    TR Interventions    SW/CM Interventions Discharge Planning, Psychosocial Support, Patient/Family Education, Disease Management/Prevention   Barriers to Discharge MD  Medical stability  Nursing Incontinence, Wound Care, Decreased caregiver support home with wife 2 level with use of stair lift to main level  PT Decreased caregiver support, Home environment Best boy, Insurance for SNF coverage, Weight, Nutrition means    OT      SLP      SW Lack of/limited family support, Decreased caregiver support, Community education officer for SNF coverage, Other (comments)     Team Discharge Planning: Destination: PT-Home ,OT- Home , SLP-  Projected Follow-up: PT-Home health PT, 24 hour supervision/assistance, OT-  Home health OT, SLP-  Projected Equipment Needs: PT-To be determined, OT- TBD , SLP-  Equipment Details: PT- , OT- TBD Patient/family involved in discharge planning: PT- Patient, Family member/caregiver,  OT-Patient, Family member/caregiver, SLP-   MD ELOS: 7d Medical Rehab Prognosis:  Good Assessment: The patient has been admitted for CIR therapies with the diagnosis of CVA. The team  will be addressing functional mobility, strength, stamina, balance, safety, adaptive techniques and equipment, self-care, bowel and bladder mgt, patient and caregiver education, complex wound care . Goals have been set  at Supervision . Anticipated discharge destination is Home .  See Team Conference Notes for weekly updates to the plan of care

## 2023-03-10 NOTE — Progress Notes (Signed)
Physical Therapy Session Note  Patient Details  Name: Douglas Melton MRN: [id number] Date of Birth: 29-Jan-1936  Today's Date: 03/10/2023 PT Individual Time: 1420-1508 PT Individual Time Calculation (min): 48 min   Short Term Goals: Week 1:  PT Short Term Goal 1 (Week 1): Pt will perform bed mobility with overall supervision to both sides of bed. PT Short Term Goal 2 (Week 1): pt will perform all standing transfers with CGA and LRAD PT Short Term Goal 3 (Week 1): Pt will ambulate at least 30 ft using LRAD and CGA. PT Short Term Goal 4 (Week 1): Pt will initiate stair training.  Skilled Therapeutic Interventions/Progress Updates:    Pt received supine in bed with his wife, Douglas Melton, present and pt agreeable to therapy session. Supine>sitting R EOB, HOB slightly elevated with supervision. Sitting EOB supervision/mod-I, donned shoes total A for time management.  Partial L stand pivot EOB>w/c, no AD, to practice pt using his hands to orient himself to the chair during the transfer due to visual deficits and to practice how pt performed these mobility tasks before - completed with CGA/light min A for safety/steadying and min/mod verbal cuing.    Transported to/from gym in w/c for time management and energy conservation.  Gait training ~73ft x3 using R UE support on therapist's arm to simulate pt walking with his wife being his guide due to visual deficits with +2 CGA/light min assist for steadying/balance especially when turning. Pt demonstrating the following gait deviations with therapist providing the described cuing and facilitation for improvement:  +2 guarding for safety - mild LOBs especially when turning, but also with any slight deviation away from a straight path - pt with mild startle response when starting to experience minor LOB - decreased gait speed with decreased B LE step lengths and heights - guarded L UE posturing due to impaired balance  Discussed recommendation for pt to  utilize RW at D/C based on his current balance deficits and pt in agreement - will need to practice use of RW in more home-like environment; however, pt has very specific navigation strategies he utilizes at home due to his visual deficits that we will likely be unable to simulate.  Discussed transfer to chair lift for home entry because pt's wife will also need to utilize the chair lift to navigate the stairs. Current suggestion, to be discussed further with pt and his wife, is for pt to sit in a chair at the bottom of the stairs next to the chair lift while his wife rides the chair lift up and sends the chair lift back down. Then, pt will perform partial stand pivot to the chair lift while pt's wife waits at the top of the stairs to assist him off of the chair lift. Pt performed x3 reps partial stand pivots w/c<>chair with armrests to simulate chair lift with CGA progressed to supervision with good utilization of his hands to orient himself to the chairs.  Transported back to room. R partial stand pivot w/c>EOB with pt utilizing his hands to orient himself with CGA for safety. Sit>supine with supervision. Pt left supine in bed with needs in reach, bed alarm on, and his wife present.   Therapy Documentation Precautions:  Precautions Precautions: Fall Precaution Comments: blind, large outer table craniectomy at anterior aspect of head Restrictions Weight Bearing Restrictions: No   Pain:  No reports of pain throughout session.    Therapy/Group: Individual Therapy  Ginny Forth , PT, DPT, NCS, CSRS 03/10/2023, 2:43 PM

## 2023-03-10 NOTE — Progress Notes (Signed)
Patient ID: Douglas Melton, male   DOB: December 11, 1935, 87 y.o.   MRN: [id number]  OP at [facility]

## 2023-03-11 DIAGNOSIS — D044 Carcinoma in situ of skin of scalp and neck: Secondary | ICD-10-CM | POA: Diagnosis not present

## 2023-03-11 DIAGNOSIS — I680 Cerebral amyloid angiopathy: Secondary | ICD-10-CM | POA: Diagnosis not present

## 2023-03-11 DIAGNOSIS — I6340 Cerebral infarction due to embolism of unspecified cerebral artery: Secondary | ICD-10-CM | POA: Diagnosis not present

## 2023-03-11 DIAGNOSIS — H409 Unspecified glaucoma: Secondary | ICD-10-CM | POA: Diagnosis not present

## 2023-03-11 MED ORDER — GLUCAGON HCL RDNA (DIAGNOSTIC) 1 MG IJ SOLR
INTRAMUSCULAR | Status: AC
  Filled 2023-03-11: qty 1

## 2023-03-11 NOTE — Progress Notes (Addendum)
Patient ID: Douglas Melton, male   DOB: 1936/03/12, 87 y.o.   MRN: [id number]  [facility] referral sent to [facility]  1:33 PM: Patient approved by [facility]

## 2023-03-11 NOTE — Progress Notes (Signed)
Physical Therapy Session Note  Patient Details  Name: Douglas Melton MRN: [id number] Date of Birth: Sep 09, 1935  Today's Date: 03/11/2023 PT Individual Time: 1405-1500 and 1650-1732 PT Individual Time Calculation (min): 55 min and 44 min  Short Term Goals: Week 1:  PT Short Term Goal 1 (Week 1): Pt will perform bed mobility with overall supervision to both sides of bed. PT Short Term Goal 2 (Week 1): pt will perform all standing transfers with CGA and LRAD PT Short Term Goal 3 (Week 1): Pt will ambulate at least 30 ft using LRAD and CGA. PT Short Term Goal 4 (Week 1): Pt will initiate stair training.  Skilled Therapeutic Interventions/Progress Updates:    Session 1: Pt received in bed, alarm activated, agreeable to PT.  Bed mobility: CGA for safety d/t visual deficits.  STS: CGA w/ use of B armrests to assist. Use of RW once in standing  Bed to chair transfer: CGA w/ & w/o RW. Increased verbal cues for directions when using RW. Pt requires less verbal cues w/o RW if setup 90/90 for transfer. Pt is able to feel for surfaces to guide himself over w/ CGA.  Education to pt & wife:  - Educated on the benefits of using a RW for ambulation rather than guidance from wife's arm d/t pt's increased balance deficits. Pt & wife both agree a RW would be best for safety. Pt's wife asked about a WC. Therapy educated that a transport chair might be the best option given he is ambulatory w/ RW CGA and knows his home setup well. A transport chair would be beneficial for when they go out of the home to eat in restaurants. This would allow for increased safety d/t pt's increased balance deficits from baseline as well as endurance deficits. They agree that it would be beneficial for those instances out of the home. - Education & visual demo from PT on how to setup a chair 90/90 at the bottom of the stairs to allow pt to transfer into chair lift independently while wife is at the top of the stairs w/ RW waiting  for him to ride up. Wife stated she understood and agreed that this setup would be safe for both of them.  Gait training: PT & pt demo 80 ft CGA w/ RW using increased verbal cues for directions given baseline visual deficits (Blind). Pt demo no LOB, decreased velocity, shuffled gait, and L RW veering. Pt unaware of veering and required verbal cues to correct and intermittent minA to remain straight. Pt & wife demo 50 ft CGA w/ RW using verbal cues for directions with no difficulty. Pt demo same gait deviations with wife as he did w/ therapist. Pt & wife able to navigate turns to L as well as finding a chair at 90 degrees on his R to sit in.  Pt left in bed, alarm activated, all needs met.  Session 2:Pt received in bed, alarm activated, agreeable to PT.  Bed mobility, STS, & transfer assistance all the same as above session. Pt performed multiple STS through out session from [facility].  Pt dependently transported to gym for energy conservation.  Neuro-ReEd: - simulating pt reaching down in drawers to grab clothes from a dresser. Match home dresser setup/organization. Pillow case in top draw to represent his t shirts and a towel in bottom draw to represent his pants. Pt asked to walk ~ 15 ft from [facility] to cabinet using RW & CGA. Verbal cues used to assist directions to cabinet.  Pt able to search for drawers and retrieve towel and pillow case while placing them ontop of the cabinet after finding it. Pt demo ability to hold onto RW w/ LUE & bend down forward to open draws w/ CGA for stability. Pt afraid he was going to hit is head on the cabinet, therapist blocking to make sure no injury would occur, therapist reassuring pt it was safe. PT educated pt about potentially using a bag on the RW to assist with carrying items from the dresser to where he will get dressed. Pt was resistant and reports he will figure it out when he gets home. He says he may or may not use the walker and will just adjust to his home and needs  on his own when he gets there. Pt reports therapy is changing up his routine and simulating things that are not helpful and he does not like. PT educated pt these are just resources to consider and for him to practice tasks before going home and potentially falling. Pt remains resistant to education.  Gait training: - 70 ft CGA w/ RW using increased verbal cues for directions given baseline visual deficits (Blind). Pt demo no LOB, decreased velocity, shuffled gait, and L RW veering. Pt able to correct veering after bumping RW into wall. - 35 ft. Same assistance as above  Pt left in bed, alarm activated, all needs met.  Therapy Documentation Precautions:  Precautions Precautions: Fall Precaution Comments: blind, large outer table craniectomy at anterior aspect of head Restrictions Weight Bearing Restrictions: No Pain:  Session 1: Pt presents with no pain during session. Session 2: Pt presents with no pain during session.  Therapy/Group: Individual Therapy  Gilman Buttner 03/11/2023, 8:00 AM

## 2023-03-11 NOTE — Progress Notes (Signed)
Patient ID: Douglas Melton, male   DOB: January 03, 1936, 87 y.o.   MRN: [id number]  SW received a call from [facility] with [facility]. Sw left VM to confirm patient is admitted to CIR.

## 2023-03-11 NOTE — Plan of Care (Signed)
  Problem: Consults Goal: RH STROKE PATIENT EDUCATION Description: See Patient Education module for education specifics  Outcome: Progressing   Problem: RH BOWEL ELIMINATION Goal: RH STG MANAGE BOWEL WITH ASSISTANCE Description: STG Manage Bowel with min Assistance. Outcome: Progressing Goal: RH STG MANAGE BOWEL W/MEDICATION W/ASSISTANCE Description: STG Manage Bowel with Medication with min Assistance. Outcome: Progressing   Problem: RH BLADDER ELIMINATION Goal: RH STG MANAGE BLADDER WITH ASSISTANCE Description: STG Manage Bladder With min Assistance Outcome: Progressing   Problem: RH SKIN INTEGRITY Goal: RH STG SKIN FREE OF INFECTION/BREAKDOWN Description: Incision will continue to heal and be free of infection/breakdown with min assist  Outcome: Progressing Goal: RH STG MAINTAIN SKIN INTEGRITY WITH ASSISTANCE Description: STG Maintain Skin Integrity With min Assistance. Outcome: Progressing Goal: RH STG ABLE TO PERFORM INCISION/WOUND CARE W/ASSISTANCE Description: STG Able To Perform Incision/Wound Care With min Assistance. Outcome: Progressing   Problem: RH SAFETY Goal: RH STG ADHERE TO SAFETY PRECAUTIONS W/ASSISTANCE/DEVICE Description: STG Adhere to Safety Precautions With cueing Assistance/Device. Outcome: Progressing Goal: RH STG DECREASED RISK OF FALL WITH ASSISTANCE Description: STG Decreased Risk of Fall With min Assistance. Outcome: Progressing   Problem: RH PAIN MANAGEMENT Goal: RH STG PAIN MANAGED AT OR BELOW PT'S PAIN GOAL Description: Less than 3 with PRN medications min assist  Outcome: Progressing   Problem: RH KNOWLEDGE DEFICIT Goal: RH STG INCREASE KNOWLEDGE OF HYPERTENSION Description: Patient/caregiver will be able to manage HTN medications and diet/lifestyle modifications to manage appropriately from nursing education and nursing handouts independently   Outcome: Progressing Goal: RH STG INCREASE KNOWLEGDE OF HYPERLIPIDEMIA Description:  Patient/caregiver will be able to manage cholesterol medications and improve HDL level of 31 from nursing education and nursing handout independently  Outcome: Progressing Goal: RH STG INCREASE KNOWLEDGE OF STROKE PROPHYLAXIS Description: Patient/caregiver will be able to manage medications, diet/lifestyle modification to reduce the risk of additional stroke from nursing education and nursing handouts independently  Outcome: Progressing

## 2023-03-11 NOTE — Progress Notes (Signed)
PROGRESS NOTE   Subjective/Complaints:  Pt feel like he is walking better, pt declines grounds pass , no pain c/o  ROS- neg CP, SOB, N/V/D Objective:   No results found. No results for input(s): "WBC", "HGB", "HCT", "PLT" in the last 72 hours.  Recent Labs    03/09/23 0434  NA 135  K 3.9  CL 104  CO2 26  GLUCOSE 140*  BUN 37*  CREATININE 1.66*  CALCIUM 8.6*    Intake/Output Summary (Last 24 hours) at 03/11/2023 0817 Last data filed at 03/11/2023 0402 Gross per 24 hour  Intake 590 ml  Output 500 ml  Net 90 ml        Physical Exam: Vital Signs Blood pressure 105/72, pulse 74, temperature 98.3 F (36.8 C), temperature source Oral, resp. rate 18, height 6\' 2"  (1.88 m), weight 59.1 kg, SpO2 97%.  Oriented to person place month and year but not day and date  General: No acute distress Mood and affect are appropriate Heart: Bradycardia  , irreg irreg and rhythm no rubs murmurs or extra sounds Lungs: Clear to auscultation, breathing unlabored, no rales or wheezes Abdomen: Positive bowel sounds, soft nontender to palpation, nondistended Extremities: No clubbing, cyanosis, or edema Skin: No evidence of breakdown, no evidence of rash    Neurologic: Cranial nerves II through XII intact, motor strength is 4/5 in bilateral deltoid, bicep, tricep, grip, hip flexor, knee extensors, ankle dorsiflexor and plantar flexor Sensory exam normal sensation to light touch and proprioception in bilateral upper and lower extremities Cerebellar examcould not perform due to visual impairment  Musculoskeletal: Full range of motion in all 4 extremities. No joint swelling   Assessment/Plan: 1. Functional deficits which require 3+ hours per day of interdisciplinary therapy in a comprehensive inpatient rehab setting. Physiatrist is providing close team supervision and 24 hour management of active medical problems listed  below. Physiatrist and rehab team continue to assess barriers to discharge/monitor patient progress toward functional and medical goals  Care Tool:  Bathing    Body parts bathed by patient: Right arm, Left arm, Chest, Abdomen, Front perineal area, Face, Left upper leg, Right upper leg   Body parts bathed by helper: Buttocks, Left lower leg, Right lower leg     Bathing assist Assist Level: Minimal Assistance - Patient > 75%     Upper Body Dressing/Undressing Upper body dressing   What is the patient wearing?: Pull over shirt    Upper body assist Assist Level: Minimal Assistance - Patient > 75%    Lower Body Dressing/Undressing Lower body dressing      What is the patient wearing?: Pants, Underwear/pull up     Lower body assist Assist for lower body dressing: Moderate Assistance - Patient 50 - 74%     Toileting Toileting    Toileting assist Assist for toileting: Moderate Assistance - Patient 50 - 74%     Transfers Chair/bed transfer  Transfers assist     Chair/bed transfer assist level: Moderate Assistance - Patient 50 - 74%     Locomotion Ambulation   Ambulation assist      Assist level: Minimal Assistance - Patient > 75% Assistive device: Walker-rolling Max distance:  6 ft   Walk 10 feet activity   Assist  Walk 10 feet activity did not occur: Safety/medical concerns        Walk 50 feet activity   Assist Walk 50 feet with 2 turns activity did not occur: Safety/medical concerns         Walk 150 feet activity   Assist Walk 150 feet activity did not occur: Safety/medical concerns         Walk 10 feet on uneven surface  activity   Assist Walk 10 feet on uneven surfaces activity did not occur: Safety/medical concerns         Wheelchair     Assist Is the patient using a wheelchair?: Yes Type of Wheelchair: Manual Wheelchair activity did not occur: Safety/medical concerns         Wheelchair 50 feet with 2 turns  activity    Assist    Wheelchair 50 feet with 2 turns activity did not occur: Safety/medical concerns       Wheelchair 150 feet activity     Assist  Wheelchair 150 feet activity did not occur: Safety/medical concerns       Blood pressure 105/72, pulse 74, temperature 98.3 F (36.8 C), temperature source Oral, resp. rate 18, height 6\' 2"  (1.88 m), weight 59.1 kg, SpO2 97%.  Medical Problem List and Plan: 1. Functional deficits secondary to bilateral frontal and parietal lobe infarcts as well as cerebral amyloid angiopathy             -patient may shower             -ELOS/Goals: 11/20, supervision goals   2.  Antithrombotics: -DVT/anticoagulation:  Mechanical:  Antiembolism stockings, knee (TED hose) Bilateral lower extremities             -antiplatelet therapy: aspirin 81 mg daily. No AC due to cerebral amyloid angiopathy   3. Pain Management: Tylenol as needed   4. Mood/Behavior/Sleep: LCSW to evaluate and provide emotional support             -antipsychotic agents: n/a   5. Neuropsych/cognition: This patient is capable of making decisions on his own behalf.   6. Skin/Wound Care: Routine skin care checks            See above vaseline and xeroform gauze to prevent large scalp wound from drying    7. 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    11: Hypothyroidism: continue Synthroid   12: DM-2: A1C = 5.8% (home glipizide discontinued)             -dc SSI now and give Ensure with meals             -carb modified diet   13: CKD stage III: Baseline creatinine ~1.4; stable          Latest Ref Rng & Units 03/09/2023    4:34 AM 03/08/2023    4:32 AM 03/05/2023    4:39 AM  BMP   Glucose 70 - 99 mg/dL 409  811  914   BUN 8 - 23 mg/dL 37  37  17   Creatinine 0.61 - 1.24 mg/dL 7.82  9.56  1.30   Sodium 135 - 145 mmol/L 135  139  139   Potassium 3.5 - 5.1 mmol/L 3.9  4.4  3.9   Chloride 98 - 111 mmol/L 104  105  104   CO2 22 - 32 mmol/L 26  27  27    Calcium 8.9 - 10.3 mg/dL 8.6  8.8  9.2   Creat improved after fluid bolus , no signs of overload recheck Monday unless pt has orthostasis     14: SSC scalp s/p resection: remove dressing 11/12>>recommendation to cut sutures of bolster and to remove bolster; synthetic matrix underlying should stay in place and be kept moist with application of Xeroform gauze and petroleum jelly daily Done per Dois Davenport PA yesterday appreciate image              -follow-up with Dr. Christoper Allegra (phone [phone] if questions re: dressing)  No drainage on Xeroform 11/15- see image above  15: Sinus tachycardia with PACs; atrial fib ruled out             -on metoprolol 25 mg BID- now bradycardic - reduce to 12.5mg  BID- HR now in 70s Vitals:   03/10/23 2212 03/11/23 0357  BP: (!) 114/57 105/72  Pulse: 72 74  Resp: 15 18  Temp: 98.2 F (36.8 C) 98.3 F (36.8 C)  SpO2: 98% 97%    16: Anemia of chronic disease/? iron deficiency: continue oral iron             -follow-up CBC- resolved     Latest Ref Rng & Units 03/08/2023    4:32 AM 03/05/2023    4:39 AM 03/02/2023    4:40 AM  CBC  WBC 4.0 - 10.5 K/uL 12.1  10.8  10.7   Hemoglobin 13.0 - 17.0 g/dL 01.0  27.2  53.6   Hematocrit 39.0 - 52.0 % 35.8  37.1  37.6   Platelets 150 - 400 K/uL 378  309  309       LOS: 4 days A FACE TO FACE EVALUATION WAS PERFORMED  Douglas Melton 03/11/2023, 8:17 AM

## 2023-03-11 NOTE — Progress Notes (Signed)
Occupational Therapy Session Note  Patient Details  Name: Douglas Melton MRN: [id number] Date of Birth: 04-Apr-1936  Today's Date: 03/11/2023 OT Individual Time: 1018-1100 OT Individual Time Calculation (min): 42 min    Short Term Goals: Week 1:  OT Short Term Goal 1 (Week 1): Pt will complete 3/3 toileting steps with min A using LRAD OT Short Term Goal 2 (Week 1): Pt will tolerate standing 3 minute during BADLs or functional tasks using LRAD with CGA OT Short Term Goal 3 (Week 1): Pt will complete LB dressing wtih min A usign LRAD  Skilled Therapeutic Interventions/Progress Updates:     Pt received reclined in bed with nursing staff and wife present in room. Pt presenting to be upset d/t having therapy scheduled at this time as he had just completed increased bed mobility with nursing staff in order to get cleaned up following incontinent BM. Therapeutic support provided and maximal motivation required for Pt to be receptive to skilled OT session reporting 0/10 pain- OT offering intermittent rest breaks, repositioning, and therapeutic support to optimize participation in therapy session. Focus this session family education training in preparation for d/c next week. Education provided on CVA etiology/recovery, fall prevention, energy conservation, and simple home modifications to increase Pt's safety. Education provided on options for DME to increase Pt's safety- no DME needs at this time as Pt has an accessible walk-in shower with seat and elevated toilet seat at home. Education provided on gait belt use and simple cues to provide Pt to increase safety. Educated on options for follow therapy of OP vs HH services with Pt's family requesting HHOT/PT at this time to decrease bourdon of care when leaving home with medical team informed. Pt transitioned to EOB with supervision +increased time with mod encouragement. Engaged Pt in completing functional mobility using RW for endurance training to simulate  home functional mobility with wife observing to learn simple cues and physical assistance to provide patient with CGA required for balance. Attempted to engage Pt in additional functional mobility to allow wife to provide hands on assistance, however Pt declining d/t fatigue. Transported Pt back to room total A in wc. Stand pivot wc>EOB using RW min A. EOB>supine supervision. Pt was left resting in bed with call bell in reach, bed alarm on, and all needs met.    Therapy Documentation Precautions:  Precautions Precautions: Fall Precaution Comments: blind, large outer table craniectomy at anterior aspect of head Restrictions Weight Bearing Restrictions: No  Therapy/Group: Individual Therapy  Clide Deutscher 03/11/2023, 11:01 AM

## 2023-03-12 DIAGNOSIS — D638 Anemia in other chronic diseases classified elsewhere: Secondary | ICD-10-CM

## 2023-03-12 DIAGNOSIS — C4442 Squamous cell carcinoma of skin of scalp and neck: Secondary | ICD-10-CM | POA: Diagnosis not present

## 2023-03-12 DIAGNOSIS — I6340 Cerebral infarction due to embolism of unspecified cerebral artery: Secondary | ICD-10-CM | POA: Diagnosis not present

## 2023-03-12 NOTE — Progress Notes (Addendum)
PROGRESS NOTE   Subjective/Complaints:  Pt resting comfortably when I arrived. Slept well and feels that he's improving with therapy  ROS: Patient denies fever, rash, sore throat, blurred vision, dizziness, nausea, vomiting, diarrhea, cough, shortness of breath or chest pain, joint or back/neck pain, headache, or mood change.    Objective:   No results found. No results for input(s): "WBC", "HGB", "HCT", "PLT" in the last 72 hours.  No results for input(s): "NA", "K", "CL", "CO2", "GLUCOSE", "BUN", "CREATININE", "CALCIUM" in the last 72 hours.   Intake/Output Summary (Last 24 hours) at 03/12/2023 0853 Last data filed at 03/12/2023 0803 Gross per 24 hour  Intake 720 ml  Output 550 ml  Net 170 ml        Physical Exam: Vital Signs Blood pressure 131/75, pulse 86, temperature 98.3 F (36.8 C), temperature source Oral, resp. rate 16, height 6\' 2"  (1.88 m), weight 59.1 kg, SpO2 97%.  Constitutional: No distress . Vital signs reviewed. HEENT: NCAT, EOMI, oral membranes moist Neck: supple Cardiovascular: Reg with PAC's. No JVD    Respiratory/Chest: CTA Bilaterally without wheezes or rales. Normal effort    GI/Abdomen: BS +, non-tender, non-distended Ext: no clubbing, cyanosis, or edema Psych: pleasant and cooperative  Skin: No evidence of breakdown, no evidence of rash--wound appears as below    Neurologic: Cranial nerves II through XII intact, motor strength is 4 to 4+/5 in bilateral deltoid, bicep, tricep, grip, hip flexor, knee extensors, ankle dorsiflexor and plantar flexor Sensory exam normal sensation to light touch and proprioception in bilateral upper and lower extremities. No gross ataxia  Musculoskeletal: Full range of motion in all 4 extremities. No joint swelling   Assessment/Plan: 1. Functional deficits which require 3+ hours per day of interdisciplinary therapy in a comprehensive inpatient rehab  setting. Physiatrist is providing close team supervision and 24 hour management of active medical problems listed below. Physiatrist and rehab team continue to assess barriers to discharge/monitor patient progress toward functional and medical goals  Care Tool:  Bathing    Body parts bathed by patient: Right arm, Left arm, Chest, Abdomen, Front perineal area, Face, Left upper leg, Right upper leg   Body parts bathed by helper: Buttocks, Left lower leg, Right lower leg     Bathing assist Assist Level: Minimal Assistance - Patient > 75%     Upper Body Dressing/Undressing Upper body dressing   What is the patient wearing?: Pull over shirt    Upper body assist Assist Level: Minimal Assistance - Patient > 75%    Lower Body Dressing/Undressing Lower body dressing      What is the patient wearing?: Pants, Underwear/pull up     Lower body assist Assist for lower body dressing: Moderate Assistance - Patient 50 - 74%     Toileting Toileting    Toileting assist Assist for toileting: Moderate Assistance - Patient 50 - 74%     Transfers Chair/bed transfer  Transfers assist     Chair/bed transfer assist level: Moderate Assistance - Patient 50 - 74%     Locomotion Ambulation   Ambulation assist      Assist level: Minimal Assistance - Patient > 75% Assistive device: Walker-rolling  Max distance: 6 ft   Walk 10 feet activity   Assist  Walk 10 feet activity did not occur: Safety/medical concerns        Walk 50 feet activity   Assist Walk 50 feet with 2 turns activity did not occur: Safety/medical concerns         Walk 150 feet activity   Assist Walk 150 feet activity did not occur: Safety/medical concerns         Walk 10 feet on uneven surface  activity   Assist Walk 10 feet on uneven surfaces activity did not occur: Safety/medical concerns         Wheelchair     Assist Is the patient using a wheelchair?: Yes Type of Wheelchair:  Manual Wheelchair activity did not occur: Safety/medical concerns         Wheelchair 50 feet with 2 turns activity    Assist    Wheelchair 50 feet with 2 turns activity did not occur: Safety/medical concerns       Wheelchair 150 feet activity     Assist  Wheelchair 150 feet activity did not occur: Safety/medical concerns       Blood pressure 131/75, pulse 86, temperature 98.3 F (36.8 C), temperature source Oral, resp. rate 16, height 6\' 2"  (1.88 m), weight 59.1 kg, SpO2 97%.  Medical Problem List and Plan: 1. Functional deficits secondary to bilateral frontal and parietal lobe infarcts as well as cerebral amyloid angiopathy             -patient may shower             -ELOS/Goals: 11/20, supervision goals   -Continue CIR therapies including PT, OT. Pt anxious to get home! 2.  Antithrombotics: -DVT/anticoagulation:  Mechanical:  Antiembolism stockings, knee (TED hose) Bilateral lower extremities             -antiplatelet therapy: aspirin 81 mg daily. No AC due to cerebral amyloid angiopathy   3. Pain Management: Tylenol as needed   4. Mood/Behavior/Sleep: LCSW to evaluate and provide emotional support             -antipsychotic agents: n/a   5. Neuropsych/cognition: This patient is capable of making decisions on his own behalf.   6. Skin/Wound Care: Routine skin care checks            See above vaseline and xeroform gauze to prevent large scalp wound from drying    -continue current plan with f/u as outpt with surgeon 7. 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    11: Hypothyroidism: continue Synthroid   12: DM-2: A1C = 5.8% (home glipizide discontinued)             -dc  SSI now and give Ensure with meals             -carb modified diet   13: CKD stage III: Baseline creatinine ~1.4; stable          Latest Ref Rng & Units 03/09/2023    4:34 AM 03/08/2023    4:32 AM 03/05/2023    4:39 AM  BMP  Glucose 70 - 99 mg/dL 409  811  914  BUN 8 - 23 mg/dL 37  37  17   Creatinine 0.61 - 1.24 mg/dL 6.57  8.46  9.62   Sodium 135 - 145 mmol/L 135  139  139   Potassium 3.5 - 5.1 mmol/L 3.9  4.4  3.9   Chloride 98 - 111 mmol/L 104  105  104   CO2 22 - 32 mmol/L 26  27  27    Calcium 8.9 - 10.3 mg/dL 8.6  8.8  9.2   Creat improved after fluid bolus , no signs of overload recheck Monday unless pt has orthostasis  11/16 no sx presently--obsv with activity      14: SSC scalp s/p resection: remove dressing 11/12>>recommendation to cut sutures of bolster and to remove bolster; synthetic matrix underlying should stay in place and be kept moist with application of Xeroform gauze and petroleum jelly daily Done per Dois Davenport PA yesterday appreciate image              -follow-up with Dr. Christoper Allegra (phone [phone] if questions re: dressing)     Xeroform, 11/16--wound appears stable  15: Sinus tachycardia with PACs; atrial fib ruled out             -on metoprolol 25 mg BID- now bradycardic - reduced to 12.5mg  BID- HR now in 70s Vitals:   03/11/23 1944 03/12/23 0358  BP: 110/62 131/75  Pulse: 69 86  Resp: 18 16  Temp: 98.2 F (36.8 C) 98.3 F (36.8 C)  SpO2: 98% 97%    16: Anemia of chronic disease/? iron deficiency: continue oral iron             -follow-up CBC- resolved     Latest Ref Rng & Units 03/08/2023    4:32 AM 03/05/2023    4:39 AM 03/02/2023    4:40 AM  CBC  WBC 4.0 - 10.5 K/uL 12.1  10.8  10.7   Hemoglobin 13.0 - 17.0 g/dL 01.0  27.2  53.6   Hematocrit 39.0 - 52.0 % 35.8  37.1  37.6   Platelets 150 - 400 K/uL 378  309  309       LOS: 5 days A FACE TO FACE EVALUATION WAS PERFORMED  Ranelle Oyster 03/12/2023, 8:53 AM

## 2023-03-12 NOTE — Progress Notes (Signed)
Physical Therapy Session Note  Patient Details  Name: Douglas Melton MRN: [id number] Date of Birth: 1935/08/02  Today's Date: 03/12/2023 PT Individual Time: 1005-1050 PT Individual Time Calculation (min): 45 min   Short Term Goals: Week 1:  PT Short Term Goal 1 (Week 1): Pt will perform bed mobility with overall supervision to both sides of bed. PT Short Term Goal 2 (Week 1): pt will perform all standing transfers with CGA and LRAD PT Short Term Goal 3 (Week 1): Pt will ambulate at least 30 ft using LRAD and CGA. PT Short Term Goal 4 (Week 1): Pt will initiate stair training.  Skilled Therapeutic Interventions/Progress Updates:    Pt received in bed, alarm activated, agreeable to PT.  Pt resistant to any education and suggestions for safety and home navigation. Pt does not like simulating tasks he will perform at home.  Discussion w/ wife about transport chair benefits. Pt resistant to purchasing, wife reports they will chat about it. Wife has a an appointment at 11am on day of DC. Informed her she can always come get him after lunch when she finishes her appointment. Pt reports she can just pick him up early and then go to her appointment. Wife said she would chat with social worker about DC time that is best for her.  Bed mobility: bed flat, height raised up high w/ railings down to match home setup. Supervision supine <> sitting. Use of hands to guide himself d/t baseline visual deficits(blind).  Pt dependently transported down to atrium in front of [employer] to practice navigating community environment around tables and finding places to sit down.  Gait training: - Pt & wife practiced navigating around tables and chairs infront of [employer] to find a seat. x2 bouts of ~40 ft w/ RW CGA from wife, wife giving verbal cues for directions. Pt reports he feels like he spends more time adjusting his walking rather than actually walking. x2 bouts of ~40 ft w/ wife guiding him by  her arm, no verbal cues, PT CGA for safety. Pt demo 1 LOB while being guided by his wife. Wife reports feeling nervous during LOB. Pt able to maintain upright w/ minA from PT and steppage strategies. - Pt practiced lining up straight to seat, guided by wife verbally, and then turning 360 while guiding himself with his hands to sit down. Pt then practiced being guided by wife verbally 90 degrees to seat, then finding seat with his hands to sit down. Pt resistant to 90 degrees safer transfer w/ consistent UE support, he prefers the 360 degeree turn with intermittent UE support. - PT asked if pt would like to try a cane in LUE while being guided by wife in RUE. He reports no he does not like using a cane, it is not as supportive.  STS: CGA for safety. BUE assist for powerup.  Bed to chair transfers: CGA w/ verbal cues to assist direction. No AD  Pt left in bed, alarm activated, all needs met.  Therapy Documentation Precautions:  Precautions Precautions: Fall Precaution Comments: blind, large outer table craniectomy at anterior aspect of head Restrictions Weight Bearing Restrictions: No Pain:  Pt reports no pain during session.  Therapy/Group: Individual Therapy  Gilman Buttner 03/12/2023, 7:53 AM

## 2023-03-12 NOTE — Progress Notes (Signed)
Occupational Therapy Session Note  Patient Details  Name: Douglas Melton MRN: [id number] Date of Birth: 10-17-1935  Today's Date: 03/12/2023 OT Individual Time: 1350-1430 OT Individual Time Calculation (min): 40 min    Short Term Goals: Week 1:  OT Short Term Goal 1 (Week 1): Pt will complete 3/3 toileting steps with min A using LRAD OT Short Term Goal 2 (Week 1): Pt will tolerate standing 3 minute during BADLs or functional tasks using LRAD with CGA OT Short Term Goal 3 (Week 1): Pt will complete LB dressing wtih min A usign LRAD  Skilled Therapeutic Interventions/Progress Updates:  Pt greeted resting in bed for skilled OT session with focus on BUE FMC/strengthening.   Pain: Pt with no reports of pain, OT offering intermediate rest breaks and positioning suggestions throughout session to address potential pain/fatigue and maximize participation/safety in session.   Functional Transfers: Pt performs stand-pivot from EOB>WC, Min A for RW management + verbal cuing due to visual deficits, ambulating transfer from WC>EOB (WC placed ~5 feet away from EOB), same level of assistance provided. CGA for STS.   Therapeutic Activities: Pt instructed in series of FMC/strengthening activities using yellow thera-putty for translation into activities such as container management. Details below: Gross Hand and Wrist Movement (rolling) Isolated Opposition (pincer grip) Retrieval of 10 beads  Pt remained resting in bed with 4Ps assessed and immediate needs met. Pt continues to be appropriate for skilled OT intervention to promote further functional independence in ADLs/IADLs.   Therapy Documentation Precautions:  Precautions Precautions: Fall Precaution Comments: blind, large outer table craniectomy at anterior aspect of head Restrictions Weight Bearing Restrictions: No   Therapy/Group: Individual Therapy  Lou Cal, OTR/L, MSOT  03/12/2023, 5:24 AM

## 2023-03-12 NOTE — Plan of Care (Signed)
  Problem: Consults Goal: RH STROKE PATIENT EDUCATION Description: See Patient Education module for education specifics  Outcome: Progressing   Problem: RH BOWEL ELIMINATION Goal: RH STG MANAGE BOWEL WITH ASSISTANCE Description: STG Manage Bowel with min Assistance. Outcome: Progressing Goal: RH STG MANAGE BOWEL W/MEDICATION W/ASSISTANCE Description: STG Manage Bowel with Medication with min Assistance. Outcome: Progressing   Problem: RH BLADDER ELIMINATION Goal: RH STG MANAGE BLADDER WITH ASSISTANCE Description: STG Manage Bladder With min Assistance Outcome: Progressing   Problem: RH SKIN INTEGRITY Goal: RH STG SKIN FREE OF INFECTION/BREAKDOWN Description: Incision will continue to heal and be free of infection/breakdown with min assist  Outcome: Progressing Goal: RH STG MAINTAIN SKIN INTEGRITY WITH ASSISTANCE Description: STG Maintain Skin Integrity With min Assistance. Outcome: Progressing Goal: RH STG ABLE TO PERFORM INCISION/WOUND CARE W/ASSISTANCE Description: STG Able To Perform Incision/Wound Care With min Assistance. Outcome: Progressing   Problem: RH SAFETY Goal: RH STG ADHERE TO SAFETY PRECAUTIONS W/ASSISTANCE/DEVICE Description: STG Adhere to Safety Precautions With cueing Assistance/Device. Outcome: Progressing Goal: RH STG DECREASED RISK OF FALL WITH ASSISTANCE Description: STG Decreased Risk of Fall With min Assistance. Outcome: Progressing   Problem: RH PAIN MANAGEMENT Goal: RH STG PAIN MANAGED AT OR BELOW PT'S PAIN GOAL Description: Less than 3 with PRN medications min assist  Outcome: Progressing   Problem: RH KNOWLEDGE DEFICIT Goal: RH STG INCREASE KNOWLEDGE OF HYPERTENSION Description: Patient/caregiver will be able to manage HTN medications and diet/lifestyle modifications to manage appropriately from nursing education and nursing handouts independently   Outcome: Progressing Goal: RH STG INCREASE KNOWLEGDE OF HYPERLIPIDEMIA Description:  Patient/caregiver will be able to manage cholesterol medications and improve HDL level of 31 from nursing education and nursing handout independently  Outcome: Progressing Goal: RH STG INCREASE KNOWLEDGE OF STROKE PROPHYLAXIS Description: Patient/caregiver will be able to manage medications, diet/lifestyle modification to reduce the risk of additional stroke from nursing education and nursing handouts independently  Outcome: Progressing

## 2023-03-12 NOTE — Progress Notes (Signed)
Occupational Therapy Session Note  Patient Details  Name: Douglas Melton MRN: [id number] Date of Birth: 24-Jun-1935  Today's Date: 03/12/2023 OT Individual Time: 0800-0900 OT Individual Time Calculation (min): 60 min    Short Term Goals: Week 1:  OT Short Term Goal 1 (Week 1): Pt will complete 3/3 toileting steps with min A using LRAD OT Short Term Goal 2 (Week 1): Pt will tolerate standing 3 minute during BADLs or functional tasks using LRAD with CGA OT Short Term Goal 3 (Week 1): Pt will complete LB dressing wtih min A usign LRAD  Skilled Therapeutic Interventions/Progress Updates:    Patient in bed eating breakfast with family present at the time of arrival.  Patient indicated that he rest well last night with no report of pain at the time of treatment. Patient in agreement with working on UB exercise.  The pt was able to transfer from bed LOF to EOB with CGA and vc's.  The pt was able to transfer from EOB to w/c using the RW and ambulating to the w/c with CGA and vc's secondary to being blind.  The pt went on to complete UB exercise.using a 2lb dumb bell for bicep curls, shld flexion,  Horizonal abduction  2 sets of 10 with rest breaks as needed. The pt went on to complete a simulated task in LB dressing using theraband.  He was able to thread his feet into the theraband with MinA and vc's for placement. The pt was instructed in relaxation breathing to improve compliance during functional task performance. The pt went on to complete dowel rod exercise 1 set of 15 for shld flexion, horizontal abduction, and shld rotataion with 2 rest breaks. The pt was rolled to the sink area and was able to wash his hands and  brush his teeth with s/u A. At the end of the session, the pt remained at w/c LOF with all additional needs addressed.  Therapy Documentation Precautions:  Precautions Precautions: Fall Precaution Comments: blind, large outer table craniectomy at anterior aspect of  head Restrictions Weight Bearing Restrictions: No Therapy/Group: Individual Therapy  Lavona Mound 03/12/2023, 12:28 PM

## 2023-03-13 DIAGNOSIS — R000 Tachycardia, unspecified: Secondary | ICD-10-CM | POA: Diagnosis not present

## 2023-03-13 DIAGNOSIS — I6340 Cerebral infarction due to embolism of unspecified cerebral artery: Secondary | ICD-10-CM | POA: Diagnosis not present

## 2023-03-13 NOTE — Progress Notes (Signed)
Physical Therapy Session Note  Patient Details  Name: Douglas Melton MRN: [id number] Date of Birth: 08-Jun-1935  Today's Date: 03/13/2023 PT Individual Time: 4098-1191 PT Individual Time Calculation (min): 45 min   Short Term Goals: Week 1:  PT Short Term Goal 1 (Week 1): Pt will perform bed mobility with overall supervision to both sides of bed. PT Short Term Goal 2 (Week 1): pt will perform all standing transfers with CGA and LRAD PT Short Term Goal 3 (Week 1): Pt will ambulate at least 30 ft using LRAD and CGA. PT Short Term Goal 4 (Week 1): Pt will initiate stair training.  Skilled Therapeutic Interventions/Progress Updates:  Pt transferred supine to edge of bed, edge of bed to supine with contact guard. Pt performed all sit to stand transfers with contact guard. Pt performed all stand pivot transfers with or without rolling walker and contact guard to min A with verbal cues. Pt ambulated 50 x 2, 100 x 2 and 150 feet x 1 with rolling walker and contact guard with verbal cues. Pt returned to room and left sitting up in bed with all needs within reach and bed alarm on.   Therapy Documentation Precautions:  Precautions Precautions: Fall Precaution Comments: blind, large outer table craniectomy at anterior aspect of head Restrictions Weight Bearing Restrictions: No General:   Pain: No c/o pain.   Therapy/Group: Individual Therapy  [name] 03/13/2023, 12:32 PM

## 2023-03-13 NOTE — Plan of Care (Signed)
  Problem: Consults Goal: RH STROKE PATIENT EDUCATION Description: See Patient Education module for education specifics  Outcome: Progressing   Problem: RH BOWEL ELIMINATION Goal: RH STG MANAGE BOWEL WITH ASSISTANCE Description: STG Manage Bowel with min Assistance. Outcome: Progressing Goal: RH STG MANAGE BOWEL W/MEDICATION W/ASSISTANCE Description: STG Manage Bowel with Medication with min Assistance. Outcome: Progressing   Problem: RH BLADDER ELIMINATION Goal: RH STG MANAGE BLADDER WITH ASSISTANCE Description: STG Manage Bladder With min Assistance Outcome: Progressing   Problem: RH SKIN INTEGRITY Goal: RH STG SKIN FREE OF INFECTION/BREAKDOWN Description: Incision will continue to heal and be free of infection/breakdown with min assist  Outcome: Progressing Goal: RH STG MAINTAIN SKIN INTEGRITY WITH ASSISTANCE Description: STG Maintain Skin Integrity With min Assistance. Outcome: Progressing Goal: RH STG ABLE TO PERFORM INCISION/WOUND CARE W/ASSISTANCE Description: STG Able To Perform Incision/Wound Care With min Assistance. Outcome: Progressing   Problem: RH SAFETY Goal: RH STG ADHERE TO SAFETY PRECAUTIONS W/ASSISTANCE/DEVICE Description: STG Adhere to Safety Precautions With cueing Assistance/Device. Outcome: Progressing Goal: RH STG DECREASED RISK OF FALL WITH ASSISTANCE Description: STG Decreased Risk of Fall With min Assistance. Outcome: Progressing   Problem: RH PAIN MANAGEMENT Goal: RH STG PAIN MANAGED AT OR BELOW PT'S PAIN GOAL Description: Less than 3 with PRN medications min assist  Outcome: Progressing   Problem: RH KNOWLEDGE DEFICIT Goal: RH STG INCREASE KNOWLEDGE OF HYPERTENSION Description: Patient/caregiver will be able to manage HTN medications and diet/lifestyle modifications to manage appropriately from nursing education and nursing handouts independently   Outcome: Progressing Goal: RH STG INCREASE KNOWLEGDE OF HYPERLIPIDEMIA Description:  Patient/caregiver will be able to manage cholesterol medications and improve HDL level of 31 from nursing education and nursing handout independently  Outcome: Progressing Goal: RH STG INCREASE KNOWLEDGE OF STROKE PROPHYLAXIS Description: Patient/caregiver will be able to manage medications, diet/lifestyle modification to reduce the risk of additional stroke from nursing education and nursing handouts independently  Outcome: Progressing

## 2023-03-13 NOTE — Progress Notes (Signed)
PROGRESS NOTE   Subjective/Complaints:  Pt slept well again. No complaints  ROS: Patient denies fever, rash, sore throat, blurred vision, dizziness, nausea, vomiting, diarrhea, cough, shortness of breath or chest pain, joint or back/neck pain, headache, or mood change.      Objective:   No results found. No results for input(s): "WBC", "HGB", "HCT", "PLT" in the last 72 hours.  No results for input(s): "NA", "K", "CL", "CO2", "GLUCOSE", "BUN", "CREATININE", "CALCIUM" in the last 72 hours.   Intake/Output Summary (Last 24 hours) at 03/13/2023 0828 Last data filed at 03/13/2023 0736 Gross per 24 hour  Intake 960 ml  Output 700 ml  Net 260 ml        Physical Exam: Vital Signs Blood pressure 135/77, pulse 72, temperature 98.2 F (36.8 C), temperature source Oral, resp. rate 18, height 6\' 2"  (1.88 m), weight 59.1 kg, SpO2 99%.  Constitutional: No distress . Vital signs reviewed. HEENT:   oral membranes moist Neck: supple Cardiovascular: RRR without murmur. No JVD    Respiratory/Chest: CTA Bilaterally without wheezes or rales. Normal effort    GI/Abdomen: BS +, non-tender, non-distended Ext: no clubbing, cyanosis, or edema Psych: pleasant and cooperative  Skin: No evidence of breakdown, no evidence of rash--wound appears c/w pics    Neurologic: Cranial nerves III through XII intact, pt is blind. motor strength is 4 to 4+/5 in bilateral deltoid, bicep, tricep, grip, hip flexor, knee extensors, ankle dorsiflexor and plantar flexor Sensory exam normal sensation to light touch and proprioception in bilateral upper and lower extremities. No gross ataxia  Musculoskeletal: Full range of motion in all 4 extremities. No joint swelling   Assessment/Plan: 1. Functional deficits which require 3+ hours per day of interdisciplinary therapy in a comprehensive inpatient rehab setting. Physiatrist is providing close team  supervision and 24 hour management of active medical problems listed below. Physiatrist and rehab team continue to assess barriers to discharge/monitor patient progress toward functional and medical goals  Care Tool:  Bathing    Body parts bathed by patient: Right arm, Left arm, Chest, Abdomen, Front perineal area, Face, Left upper leg, Right upper leg   Body parts bathed by helper: Buttocks, Left lower leg, Right lower leg     Bathing assist Assist Level: Minimal Assistance - Patient > 75%     Upper Body Dressing/Undressing Upper body dressing   What is the patient wearing?: Pull over shirt    Upper body assist Assist Level: Minimal Assistance - Patient > 75%    Lower Body Dressing/Undressing Lower body dressing      What is the patient wearing?: Pants, Underwear/pull up     Lower body assist Assist for lower body dressing: Moderate Assistance - Patient 50 - 74%     Toileting Toileting    Toileting assist Assist for toileting: Moderate Assistance - Patient 50 - 74%     Transfers Chair/bed transfer  Transfers assist     Chair/bed transfer assist level: Moderate Assistance - Patient 50 - 74%     Locomotion Ambulation   Ambulation assist      Assist level: Minimal Assistance - Patient > 75% Assistive device: Walker-rolling Max distance: 6 ft  Walk 10 feet activity   Assist  Walk 10 feet activity did not occur: Safety/medical concerns        Walk 50 feet activity   Assist Walk 50 feet with 2 turns activity did not occur: Safety/medical concerns         Walk 150 feet activity   Assist Walk 150 feet activity did not occur: Safety/medical concerns         Walk 10 feet on uneven surface  activity   Assist Walk 10 feet on uneven surfaces activity did not occur: Safety/medical concerns         Wheelchair     Assist Is the patient using a wheelchair?: Yes Type of Wheelchair: Manual Wheelchair activity did not occur:  Safety/medical concerns         Wheelchair 50 feet with 2 turns activity    Assist    Wheelchair 50 feet with 2 turns activity did not occur: Safety/medical concerns       Wheelchair 150 feet activity     Assist  Wheelchair 150 feet activity did not occur: Safety/medical concerns       Blood pressure 135/77, pulse 72, temperature 98.2 F (36.8 C), temperature source Oral, resp. rate 18, height 6\' 2"  (1.88 m), weight 59.1 kg, SpO2 99%.  Medical Problem List and Plan: 1. Functional deficits secondary to bilateral frontal and parietal lobe infarcts as well as cerebral amyloid angiopathy             -patient may shower             -ELOS/Goals: 11/20, supervision goals   -Continue CIR therapies including PT, OT  2.  Antithrombotics: -DVT/anticoagulation:  Mechanical:  Antiembolism stockings, knee (TED hose) Bilateral lower extremities             -antiplatelet therapy: aspirin 81 mg daily. No AC due to cerebral amyloid angiopathy   3. Pain Management: Tylenol as needed   4. Mood/Behavior/Sleep: LCSW to evaluate and provide emotional support             -antipsychotic agents: n/a   5. Neuropsych/cognition: This patient is capable of making decisions on his own behalf.   6. Skin/Wound Care: Routine skin care checks            See above vaseline and xeroform gauze to prevent large scalp wound from drying    -continue current plan with f/u as outpt with surgeon 7. 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    11: Hypothyroidism: continue Synthroid   12: DM-2: A1C = 5.8% (home glipizide discontinued)             -dc SSI now and give Ensure with meals             -carb modified diet    13: CKD stage III: Baseline creatinine ~1.4; stable          Latest Ref Rng & Units 03/09/2023    4:34 AM 03/08/2023    4:32 AM 03/05/2023    4:39 AM  BMP  Glucose 70 - 99 mg/dL 606  301  601   BUN 8 - 23 mg/dL 37  37  17   Creatinine 0.61 - 1.24 mg/dL 8.46  9.62  9.52   Sodium 135 - 145 mmol/L 135  139  139   Potassium 3.5 - 5.1 mmol/L 3.9  4.4  3.9   Chloride 98 - 111 mmol/L 104  105  104   CO2 22 - 32 mmol/L 26  27  27    Calcium 8.9 - 10.3 mg/dL 8.6  8.8  9.2   Creat improved after fluid bolus , no signs of overload recheck Monday unless pt has orthostasis  11/16 no sx presently--obsv with activity      14: SSC scalp s/p resection: remove dressing 11/12>>recommendation to cut sutures of bolster and to remove bolster; synthetic matrix underlying should stay in place and be kept moist with application of Xeroform gauze and petroleum jelly daily Done per Dois Davenport PA yesterday appreciate image              -follow-up with Dr. Christoper Allegra (phone [phone] if questions re: dressing)     Xeroform, 11/17--wound appears stable --continue with plan 15: Sinus tachycardia with PACs; atrial fib ruled out             -on metoprolol 25 mg BID- now bradycardic - reduced to 12.5mg  BID- HR now in 70s Vitals:   03/12/23 1918 03/13/23 0334  BP: (!) 144/67 135/77  Pulse: 79 72  Resp: 18 18  Temp: 98.1 F (36.7 C) 98.2 F (36.8 C)  SpO2: 97% 99%    16: Anemia of chronic disease/? iron deficiency: continue oral iron             -follow-up CBC- resolved     Latest Ref Rng & Units 03/08/2023    4:32 AM 03/05/2023    4:39 AM 03/02/2023    4:40 AM  CBC  WBC 4.0 - 10.5 K/uL 12.1  10.8  10.7   Hemoglobin 13.0 - 17.0 g/dL 27.2  53.6  64.4   Hematocrit 39.0 - 52.0 % 35.8  37.1  37.6   Platelets 150 - 400 K/uL 378  309  309       LOS: 6 days A FACE TO FACE EVALUATION WAS PERFORMED  Ranelle Oyster 03/13/2023, 8:28 AM

## 2023-03-13 NOTE — Progress Notes (Signed)
Physical Therapy Session Note  Patient Details  Name: Douglas Melton MRN: [id number] Date of Birth: 1935/10/04  Today's Date: 03/13/2023 PT Individual Time: 1191-4782 PT Individual Time Calculation (min): 42 min   Short Term Goals: Week 1:  PT Short Term Goal 1 (Week 1): Pt will perform bed mobility with overall supervision to both sides of bed. PT Short Term Goal 2 (Week 1): pt will perform all standing transfers with CGA and LRAD PT Short Term Goal 3 (Week 1): Pt will ambulate at least 30 ft using LRAD and CGA. PT Short Term Goal 4 (Week 1): Pt will initiate stair training.  Skilled Therapeutic Interventions/Progress Updates:  Pt was seen bedside in the pm. Pt transferred supine to edge of bed with contact guard assist. Pt transferred edge of bed to w/c without assistive device and min A with verbal cues. Pt transported to rehab gym. Pt performed hip flex and LAQs with 2 lbs, 3 sets x 10 reps each. Pt ambulated 75 feet x 2 and 150 feet with rolling walker and contact guard with verbal cues. Pt returned to room. Pt transferred w/c to edge of bed with min A and verbal cues. Pt transitioned to supine with contact guard. Pt left sitting up in bed with bed alarm on and all needs within reach.   Therapy Documentation Precautions:  Precautions Precautions: Fall Precaution Comments: blind, large outer table craniectomy at anterior aspect of head Restrictions Weight Bearing Restrictions: No General:   Pain: No c/o pain.   Therapy/Group: Individual Therapy  [name] 03/13/2023, 3:38 PM

## 2023-03-13 NOTE — Plan of Care (Signed)
  Problem: RH BLADDER ELIMINATION Goal: RH STG MANAGE BLADDER WITH ASSISTANCE Description: STG Manage Bladder With min Assistance Outcome: Progressing   Problem: RH SKIN INTEGRITY Goal: RH STG MAINTAIN SKIN INTEGRITY WITH ASSISTANCE Description: STG Maintain Skin Integrity With min Assistance. Outcome: Progressing   Problem: RH SAFETY Goal: RH STG ADHERE TO SAFETY PRECAUTIONS W/ASSISTANCE/DEVICE Description: STG Adhere to Safety Precautions With cueing Assistance/Device. Outcome: Progressing

## 2023-03-13 NOTE — Progress Notes (Signed)
Occupational Therapy Session Note  Patient Details  Name: Douglas Melton MRN: [id number] Date of Birth: 1935/12/09  Today's Date: 03/13/2023 OT Individual Time: 4098-1191 OT Individual Time Calculation (min): 45 min    Short Term Goals: Week 1:  OT Short Term Goal 1 (Week 1): Pt will complete 3/3 toileting steps with min A using LRAD OT Short Term Goal 2 (Week 1): Pt will tolerate standing 3 minute during BADLs or functional tasks using LRAD with CGA OT Short Term Goal 3 (Week 1): Pt will complete LB dressing wtih min A usign LRAD  Skilled Therapeutic Interventions/Progress Updates:    Patient resting in bed at the time of arrival, pt indicated that he rested well with no report of pain. Patient in agreement with completing OT with focus on bathing and dressing in preparation for starting his day. The pt was able to transfer from bed LOF to EOB with close S. The pt was able to transfer from EOB to w/c with CGA using the arm of the w/c. The pt was transported to the sink area. The pt was able to doff his over head shirt with MinA, he was able to come from sit to stand for doffing his pants using the RW for additional balance with ModA. The pt was able to remove his non-skid socks with MinA and additional time.The pt went on to bathe his UB and his ULE with s/uA and vc's to compensate for his visual challenges. The pt was able to donn his over head shirt with MinA, his was able to donn his briefs and pants with ModA .  The pt was ModA for donning his socks and s/u assist with his shoes. At the end of the session, the pt was returned to EOB using the bed rails for additional balance at [facility], he was MinA for managing BLE.  The call light and bedside table were placed within reach with all additional needs addressed. .  Therapy Documentation Precautions:  Precautions Precautions: Fall Precaution Comments: blind, large outer table craniectomy at anterior aspect of head Restrictions Weight Bearing  Restrictions: No  Therapy/Group: Individual Therapy  Lavona Mound 03/13/2023, 4:08 PM

## 2023-03-14 LAB — BASIC METABOLIC PANEL
Anion gap: 7 (ref 5–15)
BUN: 19 mg/dL (ref 8–23)
CO2: 25 mmol/L (ref 22–32)
Calcium: 8.9 mg/dL (ref 8.9–10.3)
Chloride: 106 mmol/L (ref 98–111)
Creatinine, Ser: 1.44 mg/dL — ABNORMAL HIGH (ref 0.61–1.24)
GFR, Estimated: 48 mL/min — ABNORMAL LOW (ref >60)
Glucose, Bld: 99 mg/dL (ref 70–99)
Potassium: 4 mmol/L (ref 3.5–5.1)
Sodium: 138 mmol/L (ref 135–145)

## 2023-03-14 LAB — CBC
HCT: 31.7 % — ABNORMAL LOW (ref 39.0–52.0)
Hemoglobin: 10.5 g/dL — ABNORMAL LOW (ref 13.0–17.0)
MCH: 31.7 pg (ref 26.0–34.0)
MCHC: 33.1 g/dL (ref 30.0–36.0)
MCV: 95.8 fL (ref 80.0–100.0)
Platelets: 531 10*3/uL — ABNORMAL HIGH (ref 150–400)
RBC: 3.31 MIL/uL — ABNORMAL LOW (ref 4.22–5.81)
RDW: 13.4 % (ref 11.5–15.5)
WBC: 14 10*3/uL — ABNORMAL HIGH (ref 4.0–10.5)
nRBC: 0 % (ref 0.0–0.2)

## 2023-03-14 NOTE — Progress Notes (Signed)
Physical Therapy Session Note  Patient Details  Name: Douglas Melton MRN: [id number] Date of Birth: [birth year]  Today's Date: 03/14/2023 PT Individual Time: 1118-1202 PT Individual Time Calculation (min): 44 min   Short Term Goals: Week 1:  PT Short Term Goal 1 (Week 1): Pt will perform bed mobility with overall supervision to both sides of bed. PT Short Term Goal 2 (Week 1): pt will perform all standing transfers with CGA and LRAD PT Short Term Goal 3 (Week 1): Pt will ambulate at least 30 ft using LRAD and CGA. PT Short Term Goal 4 (Week 1): Pt will initiate stair training.  Skilled Therapeutic Interventions/Progress Updates:  Patient supine in bed on entrance to room. Wife present. Patient alert and agreeable to PT session.   Patient with no pain complaint at start of session. Does relate question re: dressing on head as it has not been redressed this day. Related that order is in place for daily dressing changes and will be completed this afternoon by nursing.   Therapeutic Activity: Bed Mobility: Pt performed supine > sit with supervision once bed linens are removed. Pt relates that blankets are heavy and he feels "tied down". Covers do not feel heavy to therapist. Instructed pt to remove covers with arms first and then try to move legs.  Transfers: Pt performed sit<>stand and stand pivot transfers throughout session with verbal layout provided and pt able to perform with sturdy handholds. Either to RW for sit<>stand or to stable handhold during stand pivots. Performed to different seat heights/ surfaces.   Gait Training:  Pt ambulated 4' x1/ 135' x1 ft using RW with overall sup/ CGA requiring intermittent MinA for walker positioning d/t pt putting increased downward pressure into walker with consistent L veer in path deviation throughout. Provided vc/ tc for directionality.  Therapeutic Exercise: Pt guided in continuous reciprocation of  BLE using NuStep L3 x with focus on  increasing Bil knee extension throughout push.   Pt able to return to supine in bed with supervision but does require ModA +2 to position toward HOB. Patient supine in bed at end of session with brakes locked, bed alarm set, and all needs within reach.   Therapy Documentation Precautions:  Precautions Precautions: Fall Precaution Comments: blind, large outer table craniectomy at anterior aspect of head Restrictions Weight Bearing Restrictions: No  Pain: Pain Assessment Pain Scale: 0-10 Pain Score: 0-No pain related this session. Instead relates fatigue.  Therapy/Group: Individual Therapy  Loel Dubonnet PT, DPT, CSRS 03/14/2023, 12:51 PM

## 2023-03-14 NOTE — Plan of Care (Signed)
  Problem: RH SKIN INTEGRITY Goal: RH STG SKIN FREE OF INFECTION/BREAKDOWN Description: Incision will continue to heal and be free of infection/breakdown with min assist  Outcome: Progressing   Problem: RH BOWEL ELIMINATION Goal: RH STG MANAGE BOWEL W/MEDICATION W/ASSISTANCE Description: STG Manage Bowel with Medication with min Assistance. Outcome: Not Progressing Note: BM-11/14. Laxative given

## 2023-03-14 NOTE — Progress Notes (Signed)
Occupational Therapy Session Note  Patient Details  Name: Douglas Melton MRN: [id number] Date of Birth: Sep 22, 1935  Today's Date: 03/14/2023 OT Individual Time: 1660-6301 OT Individual Time Calculation (min): 43 min  And  OT Individual Time: 1300-1339 OT Individual Time Calculation (min): 39 min OT Missed Time: 36 minutes (Pt declining to participate in remainder of therapy session despite maximal therapeutic support provided 2/2 fatigue)   Short Term Goals: Week 1:  OT Short Term Goal 1 (Week 1): Pt will complete 3/3 toileting steps with min A using LRAD OT Short Term Goal 2 (Week 1): Pt will tolerate standing 3 minute during BADLs or functional tasks using LRAD with CGA OT Short Term Goal 3 (Week 1): Pt will complete LB dressing wtih min A usign LRAD  Skilled Therapeutic Interventions/Progress Updates:    AM Session: Pt received semi reclined in bed presenting to be tired and mildly frustrated upon OT arrival d/t having just switched rooms prior to OT arrival. Pt also insisting he had already completed therapy since he used the restroom and brushed teeth with nursing staff. Provided therapeutic support, gentle education on purpose of therapy, and motivation with Pt then becoming receptive to skilled OT session. Pt reporting 0/10 pain- OT offering intermittent rest breaks, repositioning, and therapeutic support to optimize participation in therapy session. Pt requesting to stay in his room for therapy session. Focus this session B UE strengthening and endurance training for improved independence in BADLs.  Pt transitioned to EOB using bed features with supervision +min verbal cues for bed rail placement. Engaged pt in seated UE exercises to increased overall strength and endurance for improved participation in BADLs. Pt completed the following exercises sitting EOB without posterior trunk support with B LEs supported on floor with OT providing verbal and tactile cues fro technique: -Bicep  curls 2x10 reps using 3# weighed dowel -Chest press 2x10 reps using 3# weighed dowel -Anterior shoulder flexion 2x10 reps using 3# weighed dowel -Overhead press 2x10 reps using 3# weighed dowel -Lateral shoulder abduction R/L 2x10 reps using 2# dumbbell  -Triceps extension R/L 2x10 reps using 2# dumbbell  Increased time for rest breaks and mod therapeutic support required during exercises to support participation. Pt fatigued at end of session and requesting to return to bed. EOB>supine supervision. Pt able to utilize bed rail to pull self to [facility] with supervision. Pt was left resting in bed with call bell in reach, bed alarm on, and all needs met.    PM Session:  Pt received sitting up in bed with wife present in room. Pt presenting to be mildly upset upon OT arrival d/t his pants being soiled in urine d/t spilling during most recent visit to BR. Provided therapeutic support with Pt receptive to skilled OT session reporting 0/10 pain- OT offering intermittent rest breaks, repositioning, and therapeutic support to optimize participation in therapy session. Focus this session BADL retraining. Pt transitioned to EOB using bed rail with supervision. Sit<>stand fro EOB no AD close supervision with Pt able to doff pants from waist in standing. Provided Pt with soapy wash cloths. Pt completed LB bathing seated EOB and stood while holding RW with close supervision to wash bottom. Pt noted to have BM seeping out when completing LB bathing with Pt unaware. Educated Pt on current status with Pt receptive to using restroom. Pt completed functional mobility to BR using RW with max verbal cues required for navigating environment d/t underlying visual deficit and CGA provided for balance to increase safety. Provided increased  time on toilet d/t need for BM- continent B&B documented in flowsheets. Pt able to perform posterior peri-care in seated position with close supervision and verbal cues provided for cleanliness.  Functional mobility back to room CGA with verbal cues provided for spatial awareness. Pt donned underwear, pants, and socks sitting EOB with close supervision and min verbal cues for orienting pants. Pt reporting fatigue and declining to participate in remainder of therapy session. Maximal therapeutic support and encouragement provided, however Pt continued to decline remainder of session, requesting to return to bed. EOB>supine supervision. Pt able to utilize grab bars to pull self towards HOB. Pt was left resting in bed with call bell in reach, bed alarm on, and all needs met.  Missed 36 minutes of skilled OT treatment. Will attempt to make up time as schedule allows.   Therapy Documentation Precautions:  Precautions Precautions: Fall Precaution Comments: blind, large outer table craniectomy at anterior aspect of head Restrictions Weight Bearing Restrictions: No   Therapy/Group: Individual Therapy  Clide Deutscher 03/14/2023, 7:54 AM

## 2023-03-15 ENCOUNTER — Other Ambulatory Visit (HOSPITAL_COMMUNITY): Payer: Self-pay

## 2023-03-15 DIAGNOSIS — I680 Cerebral amyloid angiopathy: Secondary | ICD-10-CM | POA: Diagnosis not present

## 2023-03-15 DIAGNOSIS — I6340 Cerebral infarction due to embolism of unspecified cerebral artery: Secondary | ICD-10-CM | POA: Diagnosis not present

## 2023-03-15 DIAGNOSIS — H409 Unspecified glaucoma: Secondary | ICD-10-CM | POA: Diagnosis not present

## 2023-03-15 DIAGNOSIS — D044 Carcinoma in situ of skin of scalp and neck: Secondary | ICD-10-CM | POA: Diagnosis not present

## 2023-03-15 MED ORDER — LEVETIRACETAM 500 MG PO TABS
500.0000 mg | ORAL_TABLET | Freq: Two times a day (BID) | ORAL | 0 refills | Status: DC
  Filled 2023-03-15: qty 60, 30d supply, fill #0

## 2023-03-15 MED ORDER — MELATONIN 5 MG PO TABS: 5.0000 mg | ORAL_TABLET | Freq: Every evening | ORAL | Status: AC | PRN

## 2023-03-15 MED ORDER — METOPROLOL TARTRATE 25 MG PO TABS
12.5000 mg | ORAL_TABLET | Freq: Two times a day (BID) | ORAL | 0 refills | Status: AC
  Filled 2023-03-15: qty 30, 30d supply, fill #0

## 2023-03-15 MED ORDER — ASPIRIN 81 MG PO TBEC
81.0000 mg | DELAYED_RELEASE_TABLET | Freq: Every day | ORAL | 0 refills | Status: AC
  Filled 2023-03-15: qty 30, 30d supply, fill #0

## 2023-03-15 MED ORDER — ROSUVASTATIN CALCIUM 10 MG PO TABS
10.0000 mg | ORAL_TABLET | Freq: Every day | ORAL | 0 refills | Status: AC
  Filled 2023-03-15: qty 30, 30d supply, fill #0

## 2023-03-15 MED ORDER — ACETAMINOPHEN 325 MG PO TABS: 325.0000 mg | ORAL_TABLET | ORAL | Status: AC | PRN

## 2023-03-15 NOTE — Progress Notes (Signed)
Physical Therapy Discharge Summary  Patient Details  Name: Douglas Melton MRN: [id number] Date of Birth: 02/28/1936  Date of Discharge from PT service:March 15, 2023  Today's Date: 03/15/2023 PT Individual Time: 0921-1001 and 4098-1191 PT Individual Time Calculation (min): 40 min and 32 min  And  Today's Date: 03/15/2023 PT Missed Time: 13 Minutes Missed Time Reason: Patient fatigue  Patient has met 7 of 9 long term goals due to improved activity tolerance, improved balance, improved postural control, increased strength, and ability to compensate for deficits.  Patient to discharge at an ambulatory level  supervision/CGA  using RW for household level mobility. Patient's care partner attended education/training and is independent to provide the necessary physical assistance at discharge.  Reasons goals not met: Pt continues to require CGA for dynamic standing balance due to visual deficits. Pt fearful during stair navigation and requiring min A for balance safety.  Recommendation:  Patient will benefit from ongoing skilled PT services in home health setting to continue to advance safe functional mobility, address ongoing impairments in dynamic standing balance, gait training using LRAD, B LE strengthening, and minimize fall risk.  Equipment: Transport chair recommended for community mobility , pt already has RW  Reasons for discharge: treatment goals met and discharge from hospital  Patient/family agrees with progress made and goals achieved: Yes  Skilled Therapeutic Interventions/Progress Updates:  Session 1: Pt received supine in bed, awake with his wife present and pt agreeable to therapy session and excited to D/C home tomorrow. Pt/wife report no specific questions/concerns regarding D/C tomorrow. Supine<>sitting mod-I using bed features to orient himself due to visual deficits. Stand pivot EOB<>w/c, no AD, to allow pt to use his UEs to orient himself when transferring due to  visual deficits with close supervision for safety.  Transported to/from gym in w/c for time management and energy conservation. Therapist reinforcing prior education with wife regarding recommendation for pt to use RW at D/C and therapist educating her on using verbal cuing as well as gentle facilitation to direct AD in the correct path due to pt's visual deficits and inability to direct himself in unfamiliar environments. Gait training up/down ramp using RW with CGA/light min A for steadying especially on descent due to pt being unfamiliar with the slope and requiring increased assist to safely manage AD to prevent it from going too quickly - provided manual facilitation for AD management direction due to visual deficits. Performed simulated ambulatory car transfer (small SUV) using RW with CGA for steadying and min A for placing LEs in/out of vehicle due to high floorboard and visual deficits (do not anticipate pt will require this assist in his familiar vehicle). Pt reporting significant fatigue this morning requiring frequent seated rest breaks. Therapist utilizing this time to provide reinforcement of education with wife on proper guarding technique. Transported back to room. R stand pivot w/c>EOB, no AD, with close SBA for safety and pt using his UEs on w/c armrests and bed for orienting himself. Pt left supine in bed with needs in reach, bed alarm on, and wife present.   Session 2: Pt received asleep, resting supine in bed and upon awakening agreeable to therapy session. Supine>sitting R EOB, HOB flat and using bedrail, mod-I. Sitting EOB donned shoes total A for time management. L stand pivot EOB>w/c using his hands to orient himself and guide his hips over with close supervision for safety. Pt reports he is feeling increased fatigue today that is "unusual" to his norm, but states he doesn't want this  to interfere with his ability to go home tomorrow, pt has increased his walking distance the past 2 days  that may be contributing to increased fatigue today. Transported to/from gym in w/c for time management and energy conservation. Gait training 177ft using RW with therapist guiding the RW for orientation purposes due to visual deficits and providing SBA/close supervision for safety with balance during gait. Pt demonstrating increased step lengths bilaterally (L LE step length still decreased compared to R), decreased foot clearance bilaterally but improved, and increased gait speed. With encouragement, pt agreeable to participate in stair navigation training ascending/descending 4 steps (6" height) using B HRs with min assist - step-to pattern both directions with therapist providing verbal instructions of stair layout for orientation and navigation due to visual deficits. Pt reports significant fatigue and requesting to return to room. Gait training ~158ft back to room using RW with +2 w/c follow in event of fatigue with therapist guiding AD as described above - with encouragement and education on distance left to navigate, pt able to make it all the way back to his room. Sit>supine mod-I. Pt left supine in bed with needs in reach and bed alarm on.   Missed 13 minutes of skilled physical therapy due to fatigue.    PT Discharge Precautions/Restrictions Precautions Precautions: Fall;Other (comment) Precaution Comments: blind, large outer table craniectomy at superior aspect of head Restrictions Weight Bearing Restrictions: No Pain Pain Assessment Pain Scale: 0-10 Pain Score: 0-No pain Pain Interference Pain Interference Pain Effect on Sleep: 0. Does not apply - I have not had any pain or hurting in the past 5 days Pain Interference with Therapy Activities: 1. Rarely or not at all Pain Interference with Day-to-Day Activities: 1. 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) Perception Perception: Within Functional Limits Praxis Praxis: WFL  Cognition Overall Cognitive Status: Within Functional Limits for tasks assessed Arousal/Alertness: Awake/alert Orientation Level: Oriented X4 Year: 2024 Month: November Day of Week: Correct Memory: Appears intact Awareness: Appears intact Problem Solving: Appears intact Safety/Judgment: Appears intact Sensation Sensation Light Touch: Appears Intact Hot/Cold: Not tested Proprioception: Appears Intact Stereognosis: Not tested Coordination Gross Motor Movements are Fluid and Coordinated: No Coordination and Movement Description: Slowed gait speed with significantly decreased step length/ height d/t general deconditioning and baseline visual impairments causing guarded movements Motor  Motor Motor: Other (comment) Motor - Discharge Observations: Slowed gait speed with significantly decreased step length/ height d/t general deconditioning and baseline visual impairments causing guarded movements as well as impaired balance  Mobility Bed Mobility Bed Mobility: Supine to Sit;Sit to Supine;Rolling Right;Rolling Left Rolling Right: Independent with assistive device Rolling Left: Independent with assistive device Supine to Sit: Independent with assistive device Sit to Supine: Independent with assistive device Transfers Transfers: Sit to Stand;Stand to Sit;Stand Pivot Transfers (in familiar environments due to visual deficits) Sit to Stand: Supervision/Verbal cueing Stand to Sit: Supervision/Verbal cueing Stand Pivot Transfers: Supervision/Verbal cueing Stand Pivot Transfer Details: Verbal cues for precautions/safety;Verbal cues for sequencing Stand Pivot Transfer Details (indicate cue type and reason): verbal cues for layout due to visual deficits Transfer (Assistive device): Rolling walker Locomotion  Gait Ambulation: Yes Gait Assistance: Supervision/Verbal cueing;Contact Guard/Touching assist Gait  Distance (Feet): 150 Feet Assistive device: Rolling walker Gait Assistance Details: Verbal cues for precautions/safety;Verbal cues for safe use of DME/AE Gait Assistance Details: requires verbal and tactile cuing to direct RW  for proper navigation in unfamiliar environments due to visual deficits Gait Gait: Yes Gait Pattern: Impaired Gait Pattern: Step-to pattern;Decreased step length - right;Decreased hip/knee flexion - right;Decreased hip/knee flexion - left;Trunk flexed;Narrow base of support;Decreased step length - left;Poor foot clearance - left;Poor foot clearance - right Gait velocity: significantly decreased Stairs / Additional Locomotion Stairs: Yes Stairs Assistance: Minimal Assistance - Patient > 75% Stair Management Technique: Two rails;Step to pattern;Forwards Number of Stairs: 4 Height of Stairs: 6 Ramp: Minimal Assistance - Patient >75% (using RW) Wheelchair Mobility Wheelchair Mobility: No  Trunk/Postural Assessment  Cervical Assessment Cervical Assessment: Exceptions to [facility] (forward head) Thoracic Assessment Thoracic Assessment: Exceptions to [facility] (kyphotic posturing with rounded shoulders) Lumbar Assessment Lumbar Assessment: Exceptions to [facility] (decreased lordotic curve) Postural Control Postural Control: Within Functional Limits  Balance Balance Balance Assessed: Yes Static Sitting Balance Static Sitting - Balance Support: Feet supported Static Sitting - Level of Assistance: 6: Modified independent (Device/Increase time) Dynamic Sitting Balance Dynamic Sitting - Balance Support: Feet supported;No upper extremity supported Dynamic Sitting - Level of Assistance: 6: Modified independent (Device/Increase time) Static Standing Balance Static Standing - Balance Support: During functional activity;Bilateral upper extremity supported Static Standing - Level of Assistance: 5: Stand by assistance Dynamic Standing Balance Dynamic Standing - Balance Support: During  functional activity;Bilateral upper extremity supported Dynamic Standing - Level of Assistance: 5: Stand by assistance;Other (comment) (CGA) Dynamic Standing - Balance Activities: Other (comment) (BADLs) Extremity Assessment      RLE Assessment RLE Assessment: Exceptions to [facility] Active Range of Motion (AROM) Comments: decreased ankle DF AROM RLE Strength RLE Overall Strength: Deficits RLE Overall Strength Comments: assessed in sitting with back support Right Hip Flexion: 4/5 Right Hip Extension: 4/5 Right Knee Flexion: 4/5 Right Knee Extension: 4-/5 Right Ankle Dorsiflexion: 4-/5 (through available range) Right Ankle Plantar Flexion: 4-/5 LLE Assessment LLE Assessment: Exceptions to [facility] Active Range of Motion (AROM) Comments: decreased ankle DF AROM with inversion bias LLE Strength LLE Overall Strength: Deficits LLE Overall Strength Comments: assessed in sittting with back support Left Hip Flexion: 3+/5 Left Hip Extension: 4-/5 Left Knee Flexion: 3+/5 Left Knee Extension: 4-/5 Left Ankle Dorsiflexion: 4-/5 Left Ankle Plantar Flexion: 4-/5   Ginny Forth , PT, DPT, NCS, CSRS 03/15/2023, 9:38 AM

## 2023-03-15 NOTE — Progress Notes (Signed)
PROGRESS NOTE   Subjective/Complaints:  Wife at bedside No complaints, deneis cough , no sore throat no drainage from scalp , no pain with urination   ROS: Patient denies CP, SOB, N/V/D   Objective:   No results found. Recent Labs    03/14/23 0529  WBC 14.0*  HGB 10.5*  HCT 31.7*  PLT 531*    Recent Labs    03/14/23 0529  NA 138  K 4.0  CL 106  CO2 25  GLUCOSE 99  BUN 19  CREATININE 1.44*  CALCIUM 8.9     Intake/Output Summary (Last 24 hours) at 03/15/2023 0817 Last data filed at 03/15/2023 0455 Gross per 24 hour  Intake 712 ml  Output 585 ml  Net 127 ml        Physical Exam: Vital Signs Blood pressure 113/61, pulse 80, temperature 98.4 F (36.9 C), temperature source Oral, resp. rate 16, height 6\' 2"  (1.88 m), weight 59.1 kg, SpO2 100%.   General: No acute distress Mood and affect are appropriate Heart: Regular rate and rhythm no rubs murmurs or extra sounds Lungs: Clear to auscultation, breathing unlabored, no rales or wheezes Abdomen: Positive bowel sounds, soft nontender to palpation, nondistended Extremities: No clubbing, cyanosis, or edema  Skin: No evidence of breakdown, no evidence of rash--wound appears c/w pics    Neurologic: Cranial nerves III through XII intact, pt is blind. motor strength is 4 to 4+/5 in bilateral deltoid, bicep, tricep, grip, hip flexor, knee extensors, ankle dorsiflexor and plantar flexor Sensory exam normal sensation to light touch and proprioception in bilateral upper and lower extremities. No gross ataxia  Musculoskeletal: Full range of motion in all 4 extremities. No joint swelling   Assessment/Plan: 1. Functional deficits which require 3+ hours per day of interdisciplinary therapy in a comprehensive inpatient rehab setting. Physiatrist is providing close team supervision and 24 hour management of active medical problems listed below. Physiatrist and rehab  team continue to assess barriers to discharge/monitor patient progress toward functional and medical goals  Care Tool:  Bathing    Body parts bathed by patient: Right arm, Left arm, Chest, Abdomen, Front perineal area, Face, Left upper leg, Right upper leg   Body parts bathed by helper: Buttocks, Left lower leg, Right lower leg     Bathing assist Assist Level: Minimal Assistance - Patient > 75%     Upper Body Dressing/Undressing Upper body dressing   What is the patient wearing?: Pull over shirt    Upper body assist Assist Level: Minimal Assistance - Patient > 75%    Lower Body Dressing/Undressing Lower body dressing      What is the patient wearing?: Pants, Underwear/pull up     Lower body assist Assist for lower body dressing: Moderate Assistance - Patient 50 - 74%     Toileting Toileting    Toileting assist Assist for toileting: Moderate Assistance - Patient 50 - 74%     Transfers Chair/bed transfer  Transfers assist     Chair/bed transfer assist level: Minimal Assistance - Patient > 75%     Locomotion Ambulation   Ambulation assist      Assist level: Contact Guard/Touching assist Assistive device:  Walker-rolling Max distance: 150   Walk 10 feet activity   Assist  Walk 10 feet activity did not occur: Safety/medical concerns  Assist level: Contact Guard/Touching assist Assistive device: Walker-rolling   Walk 50 feet activity   Assist Walk 50 feet with 2 turns activity did not occur: Safety/medical concerns  Assist level: Contact Guard/Touching assist Assistive device: Walker-rolling    Walk 150 feet activity   Assist Walk 150 feet activity did not occur: Safety/medical concerns  Assist level: Contact Guard/Touching assist Assistive device: Walker-rolling    Walk 10 feet on uneven surface  activity   Assist Walk 10 feet on uneven surfaces activity did not occur: Safety/medical concerns         Wheelchair     Assist Is  the patient using a wheelchair?: Yes Type of Wheelchair: Manual Wheelchair activity did not occur: Safety/medical concerns         Wheelchair 50 feet with 2 turns activity    Assist    Wheelchair 50 feet with 2 turns activity did not occur: Safety/medical concerns       Wheelchair 150 feet activity     Assist  Wheelchair 150 feet activity did not occur: Safety/medical concerns       Blood pressure 113/61, pulse 80, temperature 98.4 F (36.9 C), temperature source Oral, resp. rate 16, height 6\' 2"  (1.88 m), weight 59.1 kg, SpO2 100%.  Medical Problem List and Plan: 1. Functional deficits secondary to bilateral frontal and parietal lobe infarcts as well as cerebral amyloid angiopathy             -patient may shower             -ELOS/Goals: 11/20, supervision goals   -Continue CIR therapies including PT, OT  2.  Antithrombotics: -DVT/anticoagulation:  Mechanical:  Antiembolism stockings, knee (TED hose) Bilateral lower extremities             -antiplatelet therapy: aspirin 81 mg daily. No AC due to cerebral amyloid angiopathy   3. Pain Management: Tylenol as needed   4. Mood/Behavior/Sleep: LCSW to evaluate and provide emotional support             -antipsychotic agents: n/a   5. Neuropsych/cognition: This patient is capable of making decisions on his own behalf.   6. Skin/Wound Care: Routine skin care checks            See above vaseline and xeroform gauze to prevent large scalp wound from drying    -continue current plan with f/u as outpt with surgeon 7. 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    11: Hypothyroidism: continue Synthroid   12: DM-2: A1C = 5.8% (home  glipizide discontinued)             -dc SSI now and give Ensure with meals             -carb modified diet   13: CKD stage III: Baseline creatinine ~1.4; stable          Latest Ref Rng & Units 03/14/2023    5:29 AM 03/09/2023    4:34 AM 03/08/2023    4:32 AM  BMP  Glucose 70 -  99 mg/dL 99  664  403   BUN 8 - 23 mg/dL 19  37  37   Creatinine 0.61 - 1.24 mg/dL 4.74  2.59  5.63   Sodium 135 - 145 mmol/L 138  135  139   Potassium 3.5 - 5.1 mmol/L 4.0  3.9  4.4   Chloride 98 - 111 mmol/L 106  104  105   CO2 22 - 32 mmol/L 25  26  27    Calcium 8.9 - 10.3 mg/dL 8.9  8.6  8.8   Creat improved after fluid bolus , no signs of overload recheck Monday unless pt has orthostasis  11/16 no sx presently--obsv with activity      14: SSC scalp s/p resection: remove dressing 11/12>>recommendation to cut sutures of bolster and to remove bolster; synthetic matrix underlying should stay in place and be kept moist with application of Xeroform gauze and petroleum jelly daily Done per Dois Davenport PA yesterday appreciate image              -follow-up with Dr. Christoper Allegra (phone [phone] if questions re: dressing)     Xeroform, 11/17--wound appears stable --continue with plan 15: Sinus tachycardia with PACs; atrial fib ruled out             -on metoprolol 25 mg BID- now bradycardic - reduced to 12.5mg  BID- HR now in 70s Vitals:   03/14/23 2000 03/15/23 0449  BP: 123/86 113/61  Pulse: 70 80  Resp: 16 16  Temp: 98.2 F (36.8 C) 98.4 F (36.9 C)  SpO2: 97% 100%    16: Anemia of chronic disease/? iron deficiency: continue oral iron             -follow-up CBC- resolved     Latest Ref Rng & Units 03/14/2023    5:29 AM 03/08/2023    4:32 AM 03/05/2023    4:39 AM  CBC  WBC 4.0 - 10.5 K/uL 14.0  12.1  10.8   Hemoglobin 13.0 - 17.0 g/dL 18.8  41.6  60.6   Hematocrit 39.0 - 52.0 % 31.7  35.8  37.1   Platelets 150 - 400 K/uL 531  378  309     WBCs elevated afebrile ,no signs or symptoms of infection  mainly  has been in the 11-12K range will recheck in am   LOS: 8 days A FACE TO FACE EVALUATION WAS PERFORMED  Erick Colace 03/15/2023, 8:17 AM

## 2023-03-15 NOTE — Progress Notes (Signed)
Patient ID: Douglas Melton, male   DOB: 05-31-1935, 87 y.o.   MRN: [id number]   Transport Chair ordered through adapt.

## 2023-03-15 NOTE — Progress Notes (Signed)
Occupational Therapy Discharge Summary  Patient Details  Name: Douglas Melton MRN: [id number] Date of Birth: 1935-08-08  Date of Discharge from OT service:March 15, 2023  Today's Date: 03/15/2023 OT Individual Time: 8657-8469 OT Individual Time Calculation (min): 41 min    Patient has met 11 of 11 long term goals due to improved activity tolerance, improved balance, postural control, ability to compensate for deficits, functional use of  RIGHT upper and RIGHT lower extremity, improved attention, improved awareness, and improved coordination.  Patient to discharge at overall CGA to Supervision level.  Patient's care partner is independent to provide the necessary physical and cognitive assistance at discharge.    Reasons goals not met: All goals met  Recommendation:  Patient will benefit from ongoing skilled OT services in home health setting to continue to advance functional skills in the area of BADL and Reduce care partner burden.  Equipment: No equipment provided  Reasons for discharge: treatment goals met and discharge from hospital  Patient/family agrees with progress made and goals achieved: Yes  OT Discharge Skilled Therapeutic Interventions/Progress updates:  Pt received semi-reclined in bed presenting to be in good spirits receptive to skilled OT session reporting 0/10 pain- OT offering intermittent rest breaks, repositioning, and therapeutic support to optimize participation in therapy session. Focus this session reassessment of Pt's functional status and HEP education.   The Dynamometer Grip Strength Test is a quantitative and objective measure of isometric muscular strength of the hand and forearm. The patient was asked to sit with their back, pelvis, and knees at 90 degrees. The shoulder was adducted and neutrally rotated with the elbow flexed to 90 degrees and forearm in neutral. The arm was not supported. -Results The score was determined by calculating the average  of 3 trials. The pt's average score was 55.6 lbs in the R hand and 53 lbs in the L hand. During initial assessment, Pt scored 51 lbs in R hand and 52.33 lbs in his L hand with min improvement noted.  -Norms: Males Average in lbs 75+ R 65.7 L 55.0 R hand:  Trial 1: 55 lbs Trail 2: 58 lbs Trial 3: 54 lbs Average: 55.6  L hand:  Trial 1: 50 lbs Trial 2: 54 lbs Trial 3: 55 lbs  Average: 53 lbs  Provided Pt B UE HEP to support maintained and improved strength for BADLs. Verbal instructions provided for each exercises with tactile cues to support improved muscle activation and technique. Engaged Pt's wife in HEP education session with handout provided to support improved carryover at d/c. Pt completed the following exercises sitting EOB using yellow therband with increased time provided between trials to support optimal participation:  - Seated Elbow Extension with Self-Anchored Resistance  - 1 x daily - 7 x weekly - 2 sets - 10 reps - Seated Shoulder Horizontal Abduction with Resistance  - 1 x daily - 7 x weekly - 2 sets - 10 reps - Seated Shoulder Flexion with Self-Anchored Resistance  - 1 x daily - 7 x weekly - 2 sets - 10 reps - Seated Elbow Flexion with Self-Anchored Resistance  - 1 x daily - 7 x weekly - 2 sets - 10 reps - Seated Shoulder Diagonal with Resistance  - 1 x daily - 7 x weekly - 3 sets - 10 reps  Pt reporting fatigue at end of session. EOB>supine mod I. Pt was left resting in bed with call bell in reach, bed alarm on, and all needs met.    Precautions/Restrictions  Precautions Precautions: Fall;Other (comment) Precaution Comments: blind, large outer table craniectomy at superior aspect of head Restrictions Weight Bearing Restrictions: No Pain Pain Assessment Pain Scale: 0-10 Pain Score: 0-No pain ADL ADL Eating: Supervision/safety Where Assessed-Eating: Chair Grooming: Supervision/safety Where Assessed-Grooming: Sitting at sink Upper Body Bathing:  Supervision/safety Where Assessed-Upper Body Bathing: Chair Lower Body Bathing: Minimal assistance Where Assessed-Lower Body Bathing: Edge of bed Upper Body Dressing: Setup Where Assessed-Upper Body Dressing: Edge of bed Lower Body Dressing: Contact guard Where Assessed-Lower Body Dressing: Edge of bed Toileting: Contact guard Where Assessed-Toileting: Teacher, adult education: Furniture conservator/restorer Method: Proofreader: Engineer, technical sales: Not assessed Film/video editor: Administrator, arts Method: Ambulating Vision Baseline Vision/History: 2 Legally blind;3 Administrator, sports Retinopathy Patient Visual Report: No change from baseline Perception  Perception: Within Functional Limits Praxis Praxis: WFL Cognition Cognition Overall Cognitive Status: Within Functional Limits for tasks assessed Arousal/Alertness: Awake/alert Orientation Level: Place;Situation;Person Person: Oriented Place: Oriented Situation: Oriented Memory: Appears intact Awareness: Appears intact Problem Solving: Appears intact Executive Function:  (all intact) Safety/Judgment: Appears intact Brief Interview for Mental Status (BIMS) Repetition of Three Words (First Attempt): 3 Temporal Orientation: Year: Correct Temporal Orientation: Month: Accurate within 5 days Temporal Orientation: Day: Correct Recall: "Sock": Yes, no cue required Recall: "Blue": Yes, no cue required Recall: "Bed": Yes, no cue required BIMS Summary Score: 15 Sensation Sensation Light Touch: Appears Intact Hot/Cold: Not tested Proprioception: Appears Intact Stereognosis: Not tested Coordination Gross Motor Movements are Fluid and Coordinated: No Fine Motor Movements are Fluid and Coordinated: Yes Coordination and Movement Description: Slowed gait speed with significantly decreased step length/ height d/t general deconditioning and baseline visual impairments  causing guarded movements Finger Nose Finger Test: Unable to complete d/t vision imparement Motor  Motor Motor: Other (comment) Motor - Discharge Observations: Slowed gait speed with significantly decreased step length/ height d/t general deconditioning and baseline visual impairments causing guarded movements as well as impaired balance Mobility  Bed Mobility Bed Mobility: Supine to Sit;Sit to Supine;Rolling Right;Rolling Left Rolling Right: Independent with assistive device Rolling Left: Independent with assistive device Supine to Sit: Independent with assistive device Sit to Supine: Independent with assistive device Transfers Sit to Stand: Supervision/Verbal cueing Stand to Sit: Supervision/Verbal cueing  Trunk/Postural Assessment     Balance Balance Balance Assessed: Yes Static Sitting Balance Static Sitting - Balance Support: Feet supported;Bilateral upper extremity supported Static Sitting - Level of Assistance: 6: Modified independent (Device/Increase time) Dynamic Sitting Balance Dynamic Sitting - Balance Support: Feet supported;No upper extremity supported Dynamic Sitting - Level of Assistance: 6: Modified independent (Device/Increase time) Static Standing Balance Static Standing - Balance Support: During functional activity;Bilateral upper extremity supported Static Standing - Level of Assistance: 5: Stand by assistance (supervision to CGA) Dynamic Standing Balance Dynamic Standing - Balance Support: During functional activity;Bilateral upper extremity supported Dynamic Standing - Level of Assistance: 5: Stand by assistance Dynamic Standing - Balance Activities: Other (comment) (BADLs) Extremity/Trunk Assessment RUE Assessment RUE Assessment: Within Functional Limits General Strength Comments: Mild strenght impairments d/t gneralized deconditioning LUE Assessment LUE Assessment: Within Functional Limits General Strength Comments: Mild strength impairements d/t  generalized deconditioning   Clide Deutscher 03/15/2023, 12:42 PM

## 2023-03-15 NOTE — Plan of Care (Signed)
  Problem: Consults Goal: RH STROKE PATIENT EDUCATION Description: See Patient Education module for education specifics  Outcome: Progressing   Problem: RH BOWEL ELIMINATION Goal: RH STG MANAGE BOWEL WITH ASSISTANCE Description: STG Manage Bowel with min Assistance. Outcome: Progressing Goal: RH STG MANAGE BOWEL W/MEDICATION W/ASSISTANCE Description: STG Manage Bowel with Medication with min Assistance. Outcome: Progressing   Problem: RH BLADDER ELIMINATION Goal: RH STG MANAGE BLADDER WITH ASSISTANCE Description: STG Manage Bladder With min Assistance Outcome: Progressing   Problem: RH SKIN INTEGRITY Goal: RH STG SKIN FREE OF INFECTION/BREAKDOWN Description: Incision will continue to heal and be free of infection/breakdown with min assist  Outcome: Progressing Goal: RH STG MAINTAIN SKIN INTEGRITY WITH ASSISTANCE Description: STG Maintain Skin Integrity With min Assistance. Outcome: Progressing Goal: RH STG ABLE TO PERFORM INCISION/WOUND CARE W/ASSISTANCE Description: STG Able To Perform Incision/Wound Care With min Assistance. Outcome: Progressing   Problem: RH SAFETY Goal: RH STG ADHERE TO SAFETY PRECAUTIONS W/ASSISTANCE/DEVICE Description: STG Adhere to Safety Precautions With cueing Assistance/Device. Outcome: Progressing Goal: RH STG DECREASED RISK OF FALL WITH ASSISTANCE Description: STG Decreased Risk of Fall With min Assistance. Outcome: Progressing   Problem: RH PAIN MANAGEMENT Goal: RH STG PAIN MANAGED AT OR BELOW PT'S PAIN GOAL Description: Less than 3 with PRN medications min assist  Outcome: Progressing   Problem: RH KNOWLEDGE DEFICIT Goal: RH STG INCREASE KNOWLEDGE OF HYPERTENSION Description: Patient/caregiver will be able to manage HTN medications and diet/lifestyle modifications to manage appropriately from nursing education and nursing handouts independently   Outcome: Progressing Goal: RH STG INCREASE KNOWLEGDE OF HYPERLIPIDEMIA Description:  Patient/caregiver will be able to manage cholesterol medications and improve HDL level of 31 from nursing education and nursing handout independently  Outcome: Progressing Goal: RH STG INCREASE KNOWLEDGE OF STROKE PROPHYLAXIS Description: Patient/caregiver will be able to manage medications, diet/lifestyle modification to reduce the risk of additional stroke from nursing education and nursing handouts independently  Outcome: Progressing

## 2023-03-15 NOTE — Progress Notes (Signed)
Inpatient Rehabilitation Discharge Medication Review by a Pharmacist  A complete drug regimen review was completed for this patient to identify any potential clinically significant medication issues.  High Risk Drug Classes Is patient taking? Indication by Medication  Antipsychotic No   Anticoagulant No   Antibiotic No   Opioid No   Antiplatelet No   Hypoglycemics/insulin No   Vasoactive Medication Yes Metoprolol - HTN  Chemotherapy No   Other Yes Levetiracetam - seizure Levothyroxine - low thyroid Rosuvastatin - HLD Vitamin D, folic acid, ferrous sulfate- supplement     Type of Medication Issue Identified Description of Issue Recommendation(s)  Drug Interaction(s) (clinically significant)     Duplicate Therapy     Allergy     No Medication Administration End Date     Incorrect Dose     Additional Drug Therapy Needed     Significant med changes from prior encounter (inform family/care partners about these prior to discharge).    Other       Clinically significant medication issues were identified that warrant physician communication and completion of prescribed/recommended actions by midnight of the next day:  No  Name of provider notified for urgent issues identified:   Provider Method of Notification:     Pharmacist comments: None  Time spent performing this drug regimen review (minutes): 20 minutes   Elwin Sleight 03/15/2023 8:12 PM

## 2023-03-15 NOTE — Progress Notes (Signed)
Inpatient Rehabilitation Care Coordinator Discharge Note   Patient Details  Name: Douglas Melton MRN: [id number] Date of Birth: October 01, 1935   Discharge location: Home with spouse  Length of Stay: 9 Days  Discharge activity level: Supervision  Home/community participation: Spouse  Patient response YQ:MVHQIO Literacy - How often do you need to have someone help you when you read instructions, pamphlets, or other written material from your doctor or pharmacy?: Always  Patient response NG:EXBMWU Isolation - How often do you feel lonely or isolated from those around you?: Never  Services provided included: SW, Neuropsych, Pharmacy, TR, CM, RN, SLP, OT, PT, RD, MD  Financial Services:  Financial Services Utilized: Private Insurance [facility] Medicare  Choices offered to/list presented to: Spouse and patient  Follow-up services arranged:  Home Health, DME Home Health Agency: [facility] OT SLP    DME : Transport Chair    Patient response to transportation need: Is the patient able to respond to transportation needs?: Yes In the past 12 months, has lack of transportation kept you from medical appointments or from getting medications?: No In the past 12 months, has lack of transportation kept you from meetings, work, or from getting things needed for daily living?: No   Patient/Family verbalized understanding of follow-up arrangements:  Yes  Individual responsible for coordination of the follow-up plan: [name] [phone]  Confirmed correct DME delivered: Andria Rhein 03/15/2023    Comments (or additional information):  Summary of Stay    Date/Time Discharge Planning CSW  03/08/23 1404 Discharging home with spouse who is retired and able to assist 24/7. Spouse assisting prior due to blindness and HOH. 2 level home, stair lift from basement to main level. CJB       Andria Rhein

## 2023-03-15 NOTE — Plan of Care (Signed)
  Problem: RH Balance Goal: LTG: Patient will maintain dynamic sitting balance (OT) Description: LTG:  Patient will maintain dynamic sitting balance with assistance during activities of daily living (OT) Outcome: Completed/Met Goal: LTG Patient will maintain dynamic standing with ADLs (OT) Description: LTG:  Patient will maintain dynamic standing balance with assist during activities of daily living (OT)  Outcome: Completed/Met   Problem: Sit to Stand Goal: LTG:  Patient will perform sit to stand in prep for activites of daily living with assistance level (OT) Description: LTG:  Patient will perform sit to stand in prep for activites of daily living with assistance level (OT) Outcome: Completed/Met   Problem: RH Grooming Goal: LTG Patient will perform grooming w/assist,cues/equip (OT) Description: LTG: Patient will perform grooming with assist, with/without cues using equipment (OT) Outcome: Completed/Met   Problem: RH Bathing Goal: LTG Patient will bathe all body parts with assist levels (OT) Description: LTG: Patient will bathe all body parts with assist levels (OT) Outcome: Completed/Met   Problem: RH Dressing Goal: LTG Patient will perform upper body dressing (OT) Description: LTG Patient will perform upper body dressing with assist, with/without cues (OT). Outcome: Completed/Met Goal: LTG Patient will perform lower body dressing w/assist (OT) Description: LTG: Patient will perform lower body dressing with assist, with/without cues in positioning using equipment (OT) Outcome: Completed/Met   Problem: RH Toileting Goal: LTG Patient will perform toileting task (3/3 steps) with assistance level (OT) Description: LTG: Patient will perform toileting task (3/3 steps) with assistance level (OT)  Outcome: Completed/Met   Problem: RH Toilet Transfers Goal: LTG Patient will perform toilet transfers w/assist (OT) Description: LTG: Patient will perform toilet transfers with assist,  with/without cues using equipment (OT) Outcome: Completed/Met   Problem: RH Tub/Shower Transfers Goal: LTG Patient will perform tub/shower transfers w/assist (OT) Description: LTG: Patient will perform tub/shower transfers with assist, with/without cues using equipment (OT) Outcome: Completed/Met   Problem: RH Furniture Transfers Goal: LTG Patient will perform furniture transfers w/assist (OT/PT) Description: LTG: Patient will perform furniture transfers  with assistance (OT/PT). Outcome: Completed/Met

## 2023-03-16 ENCOUNTER — Other Ambulatory Visit (HOSPITAL_COMMUNITY): Payer: Self-pay

## 2023-03-16 DIAGNOSIS — D044 Carcinoma in situ of skin of scalp and neck: Secondary | ICD-10-CM | POA: Diagnosis not present

## 2023-03-16 DIAGNOSIS — H409 Unspecified glaucoma: Secondary | ICD-10-CM | POA: Diagnosis not present

## 2023-03-16 DIAGNOSIS — I680 Cerebral amyloid angiopathy: Secondary | ICD-10-CM | POA: Diagnosis not present

## 2023-03-16 DIAGNOSIS — I6340 Cerebral infarction due to embolism of unspecified cerebral artery: Secondary | ICD-10-CM | POA: Diagnosis not present

## 2023-03-16 NOTE — Progress Notes (Signed)
PROGRESS NOTE   Subjective/Complaints:  Wife at bedside No complaints,  ROS: Patient denies CP, SOB, N/V/D, no dysuria, no abd pain , no breathing issues or cough   Objective:   No results found. Recent Labs    03/14/23 0529  WBC 14.0*  HGB 10.5*  HCT 31.7*  PLT 531*    Recent Labs    03/14/23 0529  NA 138  K 4.0  CL 106  CO2 25  GLUCOSE 99  BUN 19  CREATININE 1.44*  CALCIUM 8.9     Intake/Output Summary (Last 24 hours) at 03/16/2023 0754 Last data filed at 03/16/2023 0558 Gross per 24 hour  Intake 237 ml  Output 475 ml  Net -238 ml        Physical Exam: Vital Signs Blood pressure 117/64, pulse 81, temperature 98 F (36.7 C), resp. rate 16, height 6\' 2"  (1.88 m), weight 59.1 kg, SpO2 98%.   General: No acute distress Mood and affect are appropriate Heart: Regular rate and rhythm no rubs murmurs or extra sounds Lungs: Clear to auscultation, breathing unlabored, no rales or wheezes Abdomen: Positive bowel sounds, soft nontender to palpation, nondistended Extremities: No clubbing, cyanosis, or edema  Skin: No evidence of breakdown, no evidence of rash--wound appears c/w pics    Neurologic: Cranial nerves III through XII intact, pt is blind. motor strength is 4 to 4+/5 in bilateral deltoid, bicep, tricep, grip, hip flexor, knee extensors, ankle dorsiflexor and plantar flexor Sensory exam normal sensation to light touch and proprioception in bilateral upper and lower extremities. No gross ataxia  Musculoskeletal: Full range of motion in all 4 extremities. No joint swelling   Assessment/Plan: 1. Functional deficits due to CAA with bilateral cerebral infarcts Stable for D/C today F/u PCP in 3-4 weeks F/u Neuro 1-2 mo F/u plastic surgery Dr Christoper Allegra in 1-2 wks  No PMR f/u needed  See D/C summary See D/C instructions   Care Tool:  Bathing    Body parts bathed by patient: Right arm, Left arm,  Chest, Abdomen, Front perineal area, Face, Left upper leg, Right upper leg, Buttocks, Left lower leg, Right lower leg   Body parts bathed by helper: Buttocks, Left lower leg, Right lower leg     Bathing assist Assist Level: Contact Guard/Touching assist     Upper Body Dressing/Undressing Upper body dressing   What is the patient wearing?: Pull over shirt    Upper body assist Assist Level: Set up assist    Lower Body Dressing/Undressing Lower body dressing      What is the patient wearing?: Pants, Underwear/pull up     Lower body assist Assist for lower body dressing: Contact Guard/Touching assist (supervision to CGA for balance)     Toileting Toileting    Toileting assist Assist for toileting: Contact Guard/Touching assist     Transfers Chair/bed transfer  Transfers assist     Chair/bed transfer assist level: Supervision/Verbal cueing Chair/bed transfer assistive device: Armrests   Locomotion Ambulation   Ambulation assist      Assist level: Contact Guard/Touching assist Assistive device: Walker-rolling Max distance: 166ft   Walk 10 feet activity   Assist  Walk 10 feet activity  did not occur: Safety/medical concerns  Assist level: Supervision/Verbal cueing Assistive device: Walker-rolling   Walk 50 feet activity   Assist Walk 50 feet with 2 turns activity did not occur: Safety/medical concerns  Assist level: Supervision/Verbal cueing Assistive device: Walker-rolling    Walk 150 feet activity   Assist Walk 150 feet activity did not occur: Safety/medical concerns  Assist level: Contact Guard/Touching assist Assistive device: Walker-rolling    Walk 10 feet on uneven surface  activity   Assist Walk 10 feet on uneven surfaces activity did not occur: Safety/medical concerns   Assist level: Contact Guard/Touching assist Assistive device: Walker-rolling   Wheelchair     Assist Is the patient using a wheelchair?: Yes (for  transport) Type of Wheelchair: Manual Wheelchair activity did not occur: Safety/medical concerns  Wheelchair assist level: Dependent - Patient 0%      Wheelchair 50 feet with 2 turns activity    Assist    Wheelchair 50 feet with 2 turns activity did not occur: Safety/medical concerns   Assist Level: Dependent - Patient 0%   Wheelchair 150 feet activity     Assist  Wheelchair 150 feet activity did not occur: Safety/medical concerns   Assist Level: Dependent - Patient 0%   Blood pressure 117/64, pulse 81, temperature 98 F (36.7 C), resp. rate 16, height 6\' 2"  (1.88 m), weight 59.1 kg, SpO2 98%.  Medical Problem List and Plan: 1. Functional deficits secondary to bilateral frontal and parietal lobe infarcts as well as cerebral amyloid angiopathy             -patient may shower             -ELOS/Goals: 11/20, supervision goals   -Continue CIR therapies including PT, OT  2.  Antithrombotics: -DVT/anticoagulation:  Mechanical:  Antiembolism stockings, knee (TED hose) Bilateral lower extremities             -antiplatelet therapy: aspirin 81 mg daily. No AC due to cerebral amyloid angiopathy   3. Pain Management: Tylenol as needed   4. Mood/Behavior/Sleep: LCSW to evaluate and provide emotional support             -antipsychotic agents: n/a   5. Neuropsych/cognition: This patient is capable of making decisions on his own behalf.   6. Skin/Wound Care: Routine skin care checks            See above vaseline and xeroform gauze to prevent large scalp wound from drying    -continue current plan with f/u as outpt with surgeon 7. 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    11:  Hypothyroidism: continue Synthroid   12: DM-2: A1C = 5.8% (home glipizide discontinued)             -dc SSI now and give Ensure with meals             -carb modified diet   13: CKD stage III: Baseline creatinine ~1.4; stable          Latest Ref Rng & Units 03/14/2023    5:29 AM 03/09/2023    4:34 AM 03/08/2023    4:32 AM  BMP  Glucose 70 - 99 mg/dL 99  140  131   BUN 8 - 23 mg/dL 19  37  37   Creatinine 0.61 - 1.24 mg/dL 1.61  0.96  0.45   Sodium 135 - 145 mmol/L 138  135  139   Potassium 3.5 - 5.1 mmol/L 4.0  3.9  4.4   Chloride 98 - 111 mmol/L 106  104  105   CO2 22 - 32 mmol/L 25  26  27    Calcium 8.9 - 10.3 mg/dL 8.9  8.6  8.8   Creat improved after fluid bolus , no signs of overload recheck Monday unless pt has orthostasis  11/16 no sx presently--obsv with activity      14: SSC scalp s/p resection: remove dressing 11/12>>recommendation to cut sutures of bolster and to remove bolster; synthetic matrix underlying should stay in place and be kept moist with application of Xeroform gauze and petroleum jelly daily Done per Dois Davenport PA yesterday appreciate image              -follow-up with Dr. Christoper Allegra (phone [phone] if questions re: dressing)     Xeroform, 11/20--wound appears stable --continue with plan 15: Sinus tachycardia with PACs; atrial fib ruled out             -on metoprolol 25 mg BID- now bradycardic - reduced to 12.5mg  BID- HR now in 70-80s Vitals:   03/15/23 1945 03/16/23 0511  BP: 126/73 117/64  Pulse: 85 81  Resp: 16 16  Temp: 98.3 F (36.8 C) 98 F (36.7 C)  SpO2: 98% 98%    16: Anemia of chronic disease/? iron deficiency: continue oral iron             -follow-up CBC- resolved     Latest Ref Rng & Units 03/14/2023    5:29 AM 03/08/2023    4:32 AM 03/05/2023    4:39 AM  CBC  WBC 4.0 - 10.5 K/uL 14.0  12.1  10.8   Hemoglobin 13.0 - 17.0 g/dL 82.9  56.2  13.0   Hematocrit 39.0 - 52.0 % 31.7  35.8  37.1   Platelets 150 - 400 K/uL 531  378  309      WBCs elevated afebrile ,no signs or symptoms of infection  mainly has been in the 11-12K range , f/u with PCP   LOS: 9 days A FACE TO FACE EVALUATION WAS PERFORMED  Erick Colace 03/16/2023, 7:54 AM

## 2023-04-01 ENCOUNTER — Encounter (HOSPITAL_COMMUNITY): Payer: Self-pay

## 2023-04-01 ENCOUNTER — Emergency Department (HOSPITAL_COMMUNITY)
Admission: EM | Admit: 2023-04-01 | Discharge: 2023-04-01 | Disposition: A | Discharge status: Home / Self Care | Payer: Medicare Other | Attending: Emergency Medicine | Admitting: Emergency Medicine

## 2023-04-01 ENCOUNTER — Other Ambulatory Visit: Payer: Self-pay

## 2023-04-01 ENCOUNTER — Emergency Department (HOSPITAL_COMMUNITY): Payer: Medicare Other

## 2023-04-01 DIAGNOSIS — W19XXXA Unspecified fall, initial encounter: Secondary | ICD-10-CM | POA: Diagnosis not present

## 2023-04-01 DIAGNOSIS — I129 Hypertensive chronic kidney disease with stage 1 through stage 4 chronic kidney disease, or unspecified chronic kidney disease: Secondary | ICD-10-CM | POA: Diagnosis not present

## 2023-04-01 DIAGNOSIS — Z7982 Long term (current) use of aspirin: Secondary | ICD-10-CM | POA: Insufficient documentation

## 2023-04-01 DIAGNOSIS — S32511A Fracture of superior rim of right pubis, initial encounter for closed fracture: Secondary | ICD-10-CM | POA: Diagnosis not present

## 2023-04-01 DIAGNOSIS — S79911A Unspecified injury of right hip, initial encounter: Secondary | ICD-10-CM | POA: Diagnosis present

## 2023-04-01 DIAGNOSIS — Z79899 Other long term (current) drug therapy: Secondary | ICD-10-CM | POA: Diagnosis not present

## 2023-04-01 DIAGNOSIS — N189 Chronic kidney disease, unspecified: Secondary | ICD-10-CM | POA: Insufficient documentation

## 2023-04-01 DIAGNOSIS — Z85828 Personal history of other malignant neoplasm of skin: Secondary | ICD-10-CM | POA: Diagnosis not present

## 2023-04-01 DIAGNOSIS — E119 Type 2 diabetes mellitus without complications: Secondary | ICD-10-CM | POA: Insufficient documentation

## 2023-04-01 DIAGNOSIS — S32591A Other specified fracture of right pubis, initial encounter for closed fracture: Secondary | ICD-10-CM

## 2023-04-01 LAB — CBC WITH DIFFERENTIAL/PLATELET
Abs Immature Granulocytes: 0.04 10*3/uL (ref 0.00–0.07)
Basophils Absolute: 0.1 10*3/uL (ref 0.0–0.1)
Basophils Relative: 1 %
Eosinophils Absolute: 0.1 10*3/uL (ref 0.0–0.5)
Eosinophils Relative: 1 %
HCT: 30.7 % — ABNORMAL LOW (ref 39.0–52.0)
Hemoglobin: 9.8 g/dL — ABNORMAL LOW (ref 13.0–17.0)
Immature Granulocytes: 0 %
Lymphocytes Relative: 25 %
Lymphs Abs: 3 10*3/uL (ref 0.7–4.0)
MCH: 31.5 pg (ref 26.0–34.0)
MCHC: 31.9 g/dL (ref 30.0–36.0)
MCV: 98.7 fL (ref 80.0–100.0)
Monocytes Absolute: 1.2 10*3/uL — ABNORMAL HIGH (ref 0.1–1.0)
Monocytes Relative: 10 %
Neutro Abs: 7.7 10*3/uL (ref 1.7–7.7)
Neutrophils Relative %: 63 %
Platelets: 324 10*3/uL (ref 150–400)
RBC: 3.11 MIL/uL — ABNORMAL LOW (ref 4.22–5.81)
RDW: 14.9 % (ref 11.5–15.5)
WBC: 12 10*3/uL — ABNORMAL HIGH (ref 4.0–10.5)
nRBC: 0 % (ref 0.0–0.2)

## 2023-04-01 LAB — TYPE AND SCREEN
ABO/RH(D): O POS
Antibody Screen: NEGATIVE

## 2023-04-01 LAB — BASIC METABOLIC PANEL
Anion gap: 10 (ref 5–15)
BUN: 18 mg/dL (ref 8–23)
CO2: 23 mmol/L (ref 22–32)
Calcium: 8.9 mg/dL (ref 8.9–10.3)
Chloride: 108 mmol/L (ref 98–111)
Creatinine, Ser: 1.48 mg/dL — ABNORMAL HIGH (ref 0.61–1.24)
GFR, Estimated: 46 mL/min — ABNORMAL LOW (ref >60)
Glucose, Bld: 102 mg/dL — ABNORMAL HIGH (ref 70–99)
Potassium: 3.6 mmol/L (ref 3.5–5.1)
Sodium: 141 mmol/L (ref 135–145)

## 2023-04-01 LAB — PROTIME-INR
INR: 1.2 (ref 0.8–1.2)
Prothrombin Time: 15.3 s — ABNORMAL HIGH (ref 11.4–15.2)

## 2023-04-01 MED ORDER — SENNOSIDES-DOCUSATE SODIUM 8.6-50 MG PO TABS: 1.0000 | ORAL_TABLET | Freq: Every evening | ORAL | 0 refills | Status: AC | PRN

## 2023-04-01 MED ORDER — FENTANYL CITRATE PF 50 MCG/ML IJ SOSY: 50.0000 ug | PREFILLED_SYRINGE | INTRAMUSCULAR | Status: DC | PRN

## 2023-04-01 MED ORDER — HYDROCODONE-ACETAMINOPHEN 5-325 MG PO TABS: 1.0000 | ORAL_TABLET | ORAL | 0 refills | Status: AC | PRN

## 2023-04-01 NOTE — ED Notes (Signed)
Patient is resting comfortably. 

## 2023-04-01 NOTE — ED Provider Notes (Signed)
[place] EMERGENCY DEPARTMENT AT [facility] Provider Note   CSN: [id number] Arrival date & time: 04/01/23  5427     History  Chief Complaint  Patient presents with   Douglas Melton is a 87 y.o. male.  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  Pt said he fell today and hurt his right hip.  Pt was unable to get up and walk.  He denies hitting his head.  He is not on thinners.  No other pains.          Home Medications Prior to Admission medications   Medication Sig Start Date End Date Taking? Authorizing Provider  aspirin EC 81 MG tablet Take 1 tablet (81 mg total) by mouth daily. Swallow whole. 03/15/23  Yes Setzer, Lynnell Jude, PA-C  ferrous sulfate 325 (65 FE) MG tablet Take 1 tablet (325 mg total) by mouth 2 (two) times daily with a meal. 02/20/20  Yes Willeen Niece, MD  folic acid (FOLVITE) 400 MCG tablet Take 400 mcg by mouth daily.   Yes [provider]  HYDROcodone-acetaminophen (NORCO/VICODIN) 5-325 MG tablet Take 1 tablet by mouth every 4 (four) hours as needed. 04/01/23  Yes Jacalyn Lefevre, MD  levETIRAcetam (KEPPRA) 500 MG tablet Take 1 tablet (500 mg total) by mouth 2 (two) times daily. 03/15/23  Yes Setzer, Lynnell Jude, PA-C  levothyroxine (SYNTHROID) 88 MCG tablet Take 88 mcg by mouth daily. 12/26/19  Yes [provider]  metoprolol tartrate (LOPRESSOR) 25 MG tablet Take 0.5 tablets (12.5 mg total) by mouth 2 (two) times daily. 03/15/23  Yes Setzer, Lynnell Jude, PA-C  rosuvastatin (CRESTOR) 10 MG tablet Take 1 tablet (10 mg total) by mouth daily. 03/15/23  Yes Setzer, Lynnell Jude, PA-C  senna-docusate (SENOKOT-S) 8.6-50 MG tablet Take 1 tablet by mouth at bedtime as needed for mild constipation. 04/01/23  Yes Jacalyn Lefevre, MD  vitamin B-12 (CYANOCOBALAMIN) 100 MCG tablet Take 100 mcg by mouth daily.   Yes  [provider]  Vitamin D, Ergocalciferol, (DRISDOL) 1.25 MG (50000 UNIT) CAPS capsule Take 50,000 Units by mouth every 7 (seven) days. 01/04/22  Yes [provider]  acetaminophen (TYLENOL) 325 MG tablet Take 1-2 tablets (325-650 mg total) by mouth every 4 (four) hours as needed for mild pain (pain score 1-3). Patient not taking: Reported on 04/01/2023 03/15/23   Milinda Antis, PA-C  melatonin 5 MG TABS Take 1 tablet (5 mg total) by mouth at bedtime as needed. 03/15/23   Setzer, Lynnell Jude, PA-C  mupirocin ointment (BACTROBAN) 2 % Apply 1 Application topically daily. Patient not taking: Reported on 04/01/2023 11/09/22   [provider]      Allergies    Adhesive [tape], Fluorescein-benoxinate, and Valtrex [valacyclovir]    Review of Systems   Review of Systems  Musculoskeletal:        Right hip pain  All other systems reviewed and are negative.   Physical Exam Updated Vital Signs BP (!) 137/59   Pulse 77   Temp 97.6 F (36.4 C)   Resp 17   Ht 6\' 2"  (1.88 m)   Wt 63.5 kg   SpO2 99%   BMI 17.97 kg/m  Physical Exam Vitals and nursing note reviewed.  Constitutional:      Appearance: Normal appearance.  HENT:     Head: Normocephalic.  Comments: Pt has a large xeroform dressing on his scalp (he refused to let me look under it), but no active bleeding on dressing.    Right Ear: External ear normal.     Left Ear: External ear normal.     Nose: Nose normal.     Mouth/Throat:     Mouth: Mucous membranes are moist.     Pharynx: Oropharynx is clear.  Eyes:     Extraocular Movements: Extraocular movements intact.     Conjunctiva/sclera: Conjunctivae normal.     Pupils: Pupils are equal, round, and reactive to light.  Cardiovascular:     Rate and Rhythm: Normal rate and regular rhythm.     Pulses: Normal pulses.     Heart sounds: Normal heart sounds.  Pulmonary:     Effort: Pulmonary effort is normal.     Breath sounds: Normal breath sounds.   Abdominal:     General: Abdomen is flat. Bowel sounds are normal.     Palpations: Abdomen is soft.  Musculoskeletal:        General: Normal range of motion.     Cervical back: Normal range of motion and neck supple.       Legs:  Skin:    General: Skin is warm.     Capillary Refill: Capillary refill takes less than 2 seconds.  Neurological:     General: No focal deficit present.     Mental Status: He is alert and oriented to person, place, and time.  Psychiatric:        Mood and Affect: Mood normal.        Behavior: Behavior normal.     ED Results / Procedures / Treatments   Labs (all labs ordered are listed, but only abnormal results are displayed) Labs Reviewed  BASIC METABOLIC PANEL - Abnormal; Notable for the following components:      Result Value   Glucose, Bld 102 (*)    Creatinine, Ser 1.48 (*)    GFR, Estimated 46 (*)    All other components within normal limits  CBC WITH DIFFERENTIAL/PLATELET - Abnormal; Notable for the following components:   WBC 12.0 (*)    RBC 3.11 (*)    Hemoglobin 9.8 (*)    HCT 30.7 (*)    Monocytes Absolute 1.2 (*)    All other components within normal limits  PROTIME-INR - Abnormal; Notable for the following components:   Prothrombin Time 15.3 (*)    All other components within normal limits  URINALYSIS, ROUTINE W REFLEX MICROSCOPIC  TYPE AND SCREEN    EKG EKG Interpretation Date/Time:  Friday April 01 2023 08:47:25 EST Ventricular Rate:  82 PR Interval:  64 QRS Duration:  143 QT Interval:  458 QTC Calculation: 469 R Axis:   70  Text Interpretation: No significant change since last tracing Confirmed by Jacalyn Lefevre [id number]) on 04/01/2023 9:07:46 AM  Radiology CT Hip Right Wo Contrast  Result Date: 04/01/2023 CLINICAL DATA:  Hip trauma, question radiographically occult fracture EXAM: CT OF THE RIGHT HIP WITHOUT CONTRAST TECHNIQUE: Multidetector CT imaging of the right hip was performed according to the standard  protocol. Multiplanar CT image reconstructions were also generated. RADIATION DOSE REDUCTION: This exam was performed according to the departmental dose-optimization program which includes automated exposure control, adjustment of the mA and/or kV according to patient size and/or use of iterative reconstruction technique. COMPARISON:  Radiographs 04/01/2023 FINDINGS: Bones/Joint/Cartilage Contralateral (left) total hip prosthesis visible on the scout images. Mild sclerosis anteriorly along the  sacroiliac joint due to arthropathy. Nondisplaced fracture of the inferior pubic ramus, image 66 series 8. Mild irregularity/deformity of the lateral margin of the superior pubic ramus suspicious for potential nondisplaced fracture. Old fracture the left inferior pubic ramus seen on the scout image, this object has been present at least since 2021. No obvious fracture extension into the acetabular articular surface. I do not see a well-defined fracture of the visualized part of the sacrum although much of the sacrum is excluded. There is some mild abnormal presacral edema. Ligaments Suboptimally assessed by CT. Muscles and Tendons Grossly unremarkable Soft tissues Prominent stool ball in the rectum, cannot exclude fecal impaction. Atheromatous vascular calcifications. IMPRESSION: 1. Nondisplaced fracture of the right inferior pubic ramus. 2. Mild irregularity/deformity of the lateral margin of the right superior pubic ramus suspicious for potential nondisplaced fracture. 3. Prominent stool ball in the rectum, cannot exclude fecal impaction. 4. Atheromatous vascular calcifications. Electronically Signed   By: Gaylyn Rong M.D.   On: 04/01/2023 14:55   DG Chest 1 View  Result Date: 04/01/2023 CLINICAL DATA:  87 year old male status post multiple falls. EXAM: CHEST  1 VIEW COMPARISON:  Portable chest 02/14/2020. FINDINGS: Portable AP supine view at 0906 hours. Large lung volumes. Mediastinal contours are stable and  within normal limits. Visualized tracheal air column is within normal limits. Chronic apical pleural and parenchymal lung scarring is stable. Chronic increased pulmonary inter stitch ule opacity has mildly progressed bilaterally. No pneumothorax, pulmonary edema, pleural effusion or consolidation. Cholecystectomy clips. Negative visible bowel gas. No acute osseous abnormality identified. IMPRESSION: Evidence of chronic lung disease, hyperinflation. No acute cardiopulmonary abnormality or acute traumatic injury identified. Electronically Signed   By: [name] M.D.   On: 04/01/2023 10:11   DG Hip Unilat With Pelvis 2-3 Views Right  Result Date: 04/01/2023 CLINICAL DATA:  87 year old male status post multiple falls. EXAM: DG HIP (WITH OR WITHOUT PELVIS) 2-3V RIGHT COMPARISON:  Pelvis and left hip series 02/14/2020. FINDINGS: Three views at 0911 hours. Left hip bipolar arthroplasty. Pelvis appears stable. Chronic left inferior pubic ramus fracture. Visible arthroplasty components with normal AP alignment. No proximal right femur fracture identified on AP and frogleg lateral views. Extensive iliofemoral calcified atherosclerosis. Stool ball in the rectum. Nonobstructed bowel-gas pattern. IMPRESSION: 1. No acute fracture or dislocation identified about the right hip or pelvis. 2. Left hip bipolar arthroplasty. Chronic left inferior pubic ramus fracture. Electronically Signed   By: [name] M.D.   On: 04/01/2023 10:09    Procedures Procedures    Medications Ordered in ED Medications  fentaNYL (SUBLIMAZE) injection 50 mcg (has no administration in time range)    ED Course/ Medical Decision Making/ A&P                                 Medical Decision Making Amount and/or Complexity of Data Reviewed Labs: ordered. Radiology: ordered.  Risk Prescription drug management.   This patient presents to the ED for concern of fall, this involves an extensive number of treatment options, and is a complaint  that carries with it a high risk of complications and morbidity.  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  The pertinent results include:  cbc with wbc 12 and hgb 9.8 (hgb 10.5 2 weeks ago); bmp with cr 1.48 (cr 1.44 on 11/18); inr 1.2   Imaging Studies ordered:  I ordered imaging studies including cxr, hip xr, ct hip  I independently visualized and interpreted imaging which showed  CXR: Evidence of chronic lung disease, hyperinflation. No acute  cardiopulmonary abnormality or acute traumatic injury identified.  R hip:  No acute fracture or dislocation identified about the right hip  or pelvis.  2. Left hip bipolar arthroplasty. Chronic left inferior pubic ramus  fracture.  CT r hip: Nondisplaced fracture of the right inferior pubic ramus.  2. Mild irregularity/deformity of the lateral margin of the right  superior pubic ramus suspicious for potential nondisplaced fracture.  3. Prominent stool ball in the rectum, cannot exclude fecal  impaction.  4. Atheromatous vascular calcifications.   I agree with the radiologist interpretation   Medicines ordered and prescription drug management:   I have reviewed the patients home medicines and have made adjustments as needed   Test Considered:  ct   Problem List / ED Course:  Pelvic fx: pt wants to go home.  He has a walker.  He said he's had pelvic fx in the past.  He is stable for d/c.  He needs to f/u with ortho.  Return if worse.    Reevaluation:  After the interventions noted above, I reevaluated the  patient and found that they have :improved   Social Determinants of Health:  Lives at home   Dispostion:  After consideration of the diagnostic results and the patients response to treatment, I feel that the patent would benefit from discharge with outpatient f/u.          Final Clinical Impression(s) / ED Diagnoses Final diagnoses:  Fall, initial encounter  Other closed fracture of right pubis, initial encounter (HCC)  Fracture of superior rim of right pubis, initial encounter for closed fracture [facility])    Rx / DC Orders ED Discharge Orders          Ordered    HYDROcodone-acetaminophen (NORCO/VICODIN) 5-325 MG tablet  Every 4 hours PRN        04/01/23 1515    senna-docusate (SENOKOT-S) 8.6-50 MG tablet  At bedtime PRN        04/01/23 1515              Jacalyn Lefevre, MD 04/01/23 1515

## 2023-04-01 NOTE — ED Triage Notes (Signed)
BIB [facility] from home s/p fall x3 yesterday, denies blood thinners, LOC or hitting head. Xeroform gauze on scalp from recent skin cancer surgery. C/o R hip pain. Pain with standing, but not lying. No pain at this time. Able to stand and pivot. No shortening or rotation noted. CMS intact. Lives with wife. Pt is blind. Alert, NAD, calm, interactive.

## 2023-04-14 ENCOUNTER — Inpatient Hospital Stay: Payer: Medicare Other | Admitting: Adult Health

## 2023-05-23 ENCOUNTER — Encounter: Payer: Self-pay | Admitting: Adult Health

## 2023-05-23 ENCOUNTER — Ambulatory Visit: Payer: Medicare Other | Admitting: Adult Health

## 2023-05-23 VITALS — BP 101/51 | HR 78 | Ht 74.0 in | Wt 140.0 lb

## 2023-05-23 DIAGNOSIS — E854 Organ-limited amyloidosis: Secondary | ICD-10-CM | POA: Diagnosis not present

## 2023-05-23 DIAGNOSIS — I639 Cerebral infarction, unspecified: Secondary | ICD-10-CM

## 2023-05-23 DIAGNOSIS — E785 Hyperlipidemia, unspecified: Secondary | ICD-10-CM | POA: Diagnosis not present

## 2023-05-23 DIAGNOSIS — R569 Unspecified convulsions: Secondary | ICD-10-CM | POA: Diagnosis not present

## 2023-05-23 DIAGNOSIS — I680 Cerebral amyloid angiopathy: Secondary | ICD-10-CM

## 2023-05-23 MED ORDER — LEVETIRACETAM 500 MG PO TABS: 500.0000 mg | ORAL_TABLET | Freq: Two times a day (BID) | ORAL | 3 refills | Status: DC

## 2023-05-23 NOTE — Progress Notes (Signed)
PATIENT: Douglas Melton DOB: 06/23/1935  REASON FOR VISIT: follow up HISTORY FROM: patient PRIMARY NEUROLOGIST: Dr. Pearlean Brownie  Chief Complaint  Patient presents with   Follow-up    Patient in room #19 with his wife. Patient states he's here to discuss his history of a stroke and fall he had back in November. Patient states he wants to stay on his Keppra due to him not having an seizure since started the Rx.      HISTORY OF PRESENT ILLNESS: Today 05/23/23  Douglas Melton is a 88 y.o. 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 returns today for follow-up.  Patient reports that he does not have any residual deficits from stroke.  The patient is legally blind in both eyes.  Reports that he can see some light in the left eye.  But feels that it got slightly worse after the stroke.  Blood pressure today is in normal range.  He was discharged from the hospital on aspirin.  But due to CAD will not do any further anticoagulation.  He was discharged on Crestor.  His wife thinks that he is still taking this medicine however she will verify his prescriptions at home.  He does not smoke cigarettes.  Continues on Keppra 500 mg twice a day.  No additional seizure like events.  He does not wish to discontinue Keppra.  He returns today for an evaluation.   HISTORY Douglas Melton is a 88 y.o. 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 Most recent surgery was 11/4 when scalp mass was excised, bone scrapings of skull were taken for biopsy and some sort of dermal matrix was placed. 11/5, patient noticed painful shaking of his right arm which was uncontrollable and lasted about 5 or 6 minutes. Afterwards, he was unable to move his right arm. He had 3 more of  these episodes en route and in ED.  He states he has never had a serious head injury, has no family members with seizures and has never had meningitis or encephalitis, never had febrile seizures as a child and did not have any problems with his birth or development. No history of seizures.  MRI brain with and without contrast on 10/8 demonstrated no intracranial mass lesions to explain seizure.   Acute Ischemic Infarct:  mild embolic shower, etiology unclear, cryptogenic at time.   CT head: No acute intracranial abnormality. Generalized cerebral volume loss and moderate for age white matter disease. MRI: Numerous small acute infarcts in the left greater than right frontal and parietal lobes. Innumerable chronic microhemorrhages which may reflect cerebral amyloid angiopathy. Repeat CT Head 11/9: No acute intracranial hemorrhage or mass effect. Scattered small cerebral hemisphere infarcts remain largely occult by CT. CTA head and neck Moderate stenosis both ICAs, right ACA, right PCA. Severe stenosis Left vertebral. 60-65% stenosis right ICA 2D Echo: EF 50-55%, Grade I diastolic dysfunction, Mild LVH Given CAA pattern on MRI, do not fee pt is candidate for anticoagulation. Would not recommend further cardioembolic work up at this time LDL 89 HgbA1c 5.3 UDS negative VTE prophylaxis - SCDs No antithrombotic prior to admission, now on ASA 81 alone given findings of CAA on MRI, continue on discharge.  Therapy recommendations:  CIR Disposition:  pending   Seizure-like activity Multiple episodes of right  arm jerking, no other focal seizures symptoms, lasting 5 to 6 minutes each. Patient remembers all episodes, no post-ictal periods LTM EEG 11/6 1715 to 11/7 1208: This study is suggestive of mild diffuse encephalopathy. No seizures or epileptiform discharges were seen throughout the recording. Continue Keppra 500mg  BID   REVIEW OF SYSTEMS: Out of a complete 14 system review of symptoms, the patient  complains only of the following symptoms, and all other reviewed systems are negative.  ALLERGIES: Allergies  Allergen Reactions   Adhesive [Tape] Rash   Fluorescein-Benoxinate Rash    FA dye (Fluress- eyes)     Valtrex [Valacyclovir] Anxiety    Nervousness Jittery    HOME MEDICATIONS: Outpatient Medications Prior to Visit  Medication Sig Dispense Refill   acetaminophen (TYLENOL) 325 MG tablet Take 1-2 tablets (325-650 mg total) by mouth every 4 (four) hours as needed for mild pain (pain score 1-3). (Patient not taking: Reported on 04/01/2023)     aspirin EC 81 MG tablet Take 1 tablet (81 mg total) by mouth daily. Swallow whole. 30 tablet 0   ferrous sulfate 325 (65 FE) MG tablet Take 1 tablet (325 mg total) by mouth 2 (two) times daily with a meal. 30 tablet 3   folic acid (FOLVITE) 400 MCG tablet Take 400 mcg by mouth daily.     HYDROcodone-acetaminophen (NORCO/VICODIN) 5-325 MG tablet Take 1 tablet by mouth every 4 (four) hours as needed. 10 tablet 0   levETIRAcetam (KEPPRA) 500 MG tablet Take 1 tablet (500 mg total) by mouth 2 (two) times daily. 60 tablet 0   levothyroxine (SYNTHROID) 88 MCG tablet Take 88 mcg by mouth daily.     melatonin 5 MG TABS Take 1 tablet (5 mg total) by mouth at bedtime as needed.     metoprolol tartrate (LOPRESSOR) 25 MG tablet Take 0.5 tablets (12.5 mg total) by mouth 2 (two) times daily. 30 tablet 0   mupirocin ointment (BACTROBAN) 2 % Apply 1 Application topically daily. (Patient not taking: Reported on 04/01/2023)     rosuvastatin (CRESTOR) 10 MG tablet Take 1 tablet (10 mg total) by mouth daily. 30 tablet 0   senna-docusate (SENOKOT-S) 8.6-50 MG tablet Take 1 tablet by mouth at bedtime as needed for mild constipation. 30 tablet 0   vitamin B-12 (CYANOCOBALAMIN) 100 MCG tablet Take 100 mcg by mouth daily.     Vitamin D, Ergocalciferol, (DRISDOL) 1.25 MG (50000 UNIT) CAPS capsule Take 50,000 Units by mouth every 7 (seven) days.     No  facility-administered medications prior to visit.    PAST MEDICAL HISTORY: Past Medical History:  Diagnosis Date   Chronic kidney disease    Diabetes mellitus without complication (HCC)    Glaucoma    Hyperlipidemia    Hypertension     PAST SURGICAL HISTORY: Past Surgical History:  Procedure Laterality Date   CHOLECYSTECTOMY     TOTAL HIP ARTHROPLASTY Left 02/15/2020   Procedure: TOTAL HIP ARTHROPLASTY  ANTERIOR APPROACH;  Surgeon: Samson Frederic, MD;  Location: [facility] OR;  Service: Orthopedics;  Laterality: Left;    FAMILY HISTORY: Family History  Problem Relation Age of Onset   Heart disease Father    Hypertension Father    Other Mother     SOCIAL HISTORY: Social History   Socioeconomic History   Marital status: Married    Spouse name: Not on file   Number of children: 3   Years of education: Not on file   Highest education level: Not on file  Occupational History   Occupation: retired  Tobacco Use   Smoking status: Former    Types: Cigarettes   Smokeless tobacco: Never   Tobacco comments:    smoked ~1ppd in his 20's. He is unsure how long he smoked.   Vaping Use   Vaping status: Never Used  Substance and Sexual Activity   Alcohol use: Never   Drug use: Never   Sexual activity: Not Currently  Other Topics Concern   Not on file  Social History Narrative   Patient lives in [place], [place] with his wife.    Social Drivers of Corporate investment banker Strain: Not on file  Food Insecurity: Low Risk  (03/17/2023)   Received from [facility]   Hunger Vital Sign    Worried About Running Out of Food in the Last Year: Never true    Ran Out of Food in the Last Year: Never true  Transportation Needs: Unmet Transportation Needs (03/17/2023)   Received from [facility]    In the past 12 months, has lack of reliable transportation kept you from medical appointments, meetings, work or from getting things needed for daily living? : Yes   Physical Activity: Not on file  Stress: Not on file  Social Connections: Not on file  Intimate Partner Violence: Not At Risk (03/03/2023)   Humiliation, Afraid, Rape, and Kick questionnaire    Fear of Current or Ex-Partner: No    Emotionally Abused: No    Physically Abused: No    Sexually Abused: No      PHYSICAL EXAM  Vitals:   05/23/23 0931  BP: (!) 101/51  Pulse: 78  Weight: 140 lb (63.5 kg)  Height: 6\' 2"  (1.88 m)   Body mass index is 17.97 kg/m.  Generalized: Well developed, in no acute distress   Neurological examination  Mentation: Alert oriented to time, place, history taking. Follows all commands speech and language fluent Cranial nerve II-XII: Legally blind in both eyes.  Facial sensation and strength were normal. Uvula tongue midline. Head turning and shoulder shrug  were normal and symmetric. Motor: The motor testing reveals 5 over 5 strength of all 4 extremities. Good symmetric motor tone is noted throughout.  Sensory: Sensory testing is intact to soft touch on all 4 extremities. No evidence of extinction is noted.  Coordination: Unable to form finger-nose-finger due to visual deficit Gait and station: In a wheelchair today.   DIAGNOSTIC DATA (LABS, IMAGING, TESTING) - I reviewed patient records, labs, notes, testing and imaging myself where available.  Lab Results  Component Value Date   WBC 12.0 (H) 04/01/2023   HGB 9.8 (L) 04/01/2023   HCT 30.7 (L) 04/01/2023   MCV 98.7 04/01/2023   PLT 324 04/01/2023      Component Value Date/Time   NA 141 04/01/2023 0900   K 3.6 04/01/2023 0900   CL 108 04/01/2023 0900   CO2 23 04/01/2023 0900   GLUCOSE 102 (H) 04/01/2023 0900   BUN 18 04/01/2023 0900   CREATININE 1.48 (H) 04/01/2023 0900   CALCIUM 8.9 04/01/2023 0900   PROT 5.9 (L) 03/08/2023 0432   ALBUMIN 2.5 (L) 03/08/2023 0432   AST 25 03/08/2023 0432   ALT 13 03/08/2023 0432   ALKPHOS 65 03/08/2023 0432   BILITOT 0.9 03/08/2023 0432   GFRNONAA 46  (L) 04/01/2023 0900   GFRAA 57 (L) 11/03/2018 0234   Lab Results  Component Value Date   CHOL 138 03/05/2023   HDL 31 (  L) 03/05/2023   LDLCALC 89 03/05/2023   TRIG 88 03/05/2023   CHOLHDL 4.5 03/05/2023   Lab Results  Component Value Date   HGBA1C 5.3 03/05/2023   Lab Results  Component Value Date   VITAMINB12 <50 (L) 11/02/2018   Lab Results  Component Value Date   TSH 3.483 03/01/2023      ASSESSMENT AND PLAN 88 y.o. year old male  has a past medical history of Chronic kidney disease, Diabetes mellitus without complication (HCC), Glaucoma, Hyperlipidemia, and Hypertension. here with:  Acute ischemic infarct-mild embolic shower pattern consistent with CAA Seizure-like event   Continue aspirin 81 mg daily  for secondary stroke prevention.  Discussed secondary stroke prevention measures and importance of close PCP follow up for aggressive stroke risk factor management. I have gone over the pathophysiology of stroke, warning signs and symptoms, risk factors and their management in some detail with instructions to go to the closest emergency room for symptoms of concern. HTN: BP goal <130/90.   HLD: LDL goal <70. Recent LDL 89.  DMII: A1c goal<7.0. Recent A1c 5.3.  Continue Keppra 500 mg twice a day Encouraged patient to monitor diet and encouraged exercise FU with our office 6 months       Butch Penny, MSN, NP-C 05/23/2023, 8:14 AM [facility] [address], [address] [place], [place] [address] [phone]

## 2023-05-23 NOTE — Patient Instructions (Signed)
Your Plan:  Continue ASA Continue Keppra 500 mg twice a day  Blood pressure goal <130/90 Cholesterol LDL goal <70 Diabetes goal A1c <7 Monitor diet and try to exercise   Thank you for coming to see [place] at [facility]. I hope we have been able to provide you high quality care today.  You may receive a patient satisfaction survey over the next few weeks. We would appreciate your feedback and comments so that we may continue to improve ourselves and the health of our patients.

## 2023-05-25 NOTE — Progress Notes (Signed)
I agree with the above plan

## 2024-02-08 ENCOUNTER — Encounter: Payer: Self-pay | Admitting: Neurology

## 2024-02-08 ENCOUNTER — Ambulatory Visit: Payer: Medicare Other | Admitting: Neurology

## 2024-02-08 VITALS — BP 101/68 | HR 107

## 2024-02-08 DIAGNOSIS — Z8673 Personal history of transient ischemic attack (TIA), and cerebral infarction without residual deficits: Secondary | ICD-10-CM

## 2024-02-08 DIAGNOSIS — I639 Cerebral infarction, unspecified: Secondary | ICD-10-CM

## 2024-02-08 DIAGNOSIS — E538 Deficiency of other specified B group vitamins: Secondary | ICD-10-CM | POA: Insufficient documentation

## 2024-02-08 DIAGNOSIS — H548 Legal blindness, as defined in USA: Secondary | ICD-10-CM | POA: Diagnosis not present

## 2024-02-08 MED ORDER — LEVETIRACETAM 500 MG PO TABS: 500.0000 mg | ORAL_TABLET | Freq: Two times a day (BID) | ORAL | 3 refills | Status: AC

## 2024-02-08 NOTE — Progress Notes (Signed)
 PATIENT: Douglas Melton DOB: 1935-11-23  REASON FOR VISIT: follow up HISTORY FROM: patient PRIMARY NEUROLOGIST: Dr. Rosemarie  Chief Complaint  Patient presents with   RM17/CVA    Pt is here with his Wife. Pt states that he is blind and can't walk but other than that he is doing fine. Pt states he would like to discuss his Keppra  and staying on it.      HISTORY OF PRESENT ILLNESS: UPDATE 02/08/24 : He returns for follow-up after last visit 8 months ago.  He is accompanied by his wife.  He is doing well without recurrent TIA or stroke symptoms.  He remains on aspirin  which is tolerating well without bruising or bleeding.  His blood pressure is under good control and today it is 107/68.  He is tolerating Crestor  well without side effects.  Last lab work on 01/23/2024 showed LDL cholesterol to be optimal at 52 mg percent and hemoglobin A1c at 5.9.  Patient remains legally blind in both eyes for the last 5 years following complications of glaucoma and retinal disease and optic nerve damage.  He can walk a little bit around the house with a walker with one-person help.  He uses a wheelchair most of the times.  Patient has not had any breakthrough seizures since November last year and remains on Keppra  milligrams 500 twice daily which he is keen to continue and does not want to reduce the dose of try to stop it.  He is states his memory is fine.  He has no new complaints.  Prior visit Duwaine Eriksson, NP 05/23/2023 : Keiffer Piper is a 88 y.o. 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 returns today for follow-up.  Patient reports that he does not have any residual deficits from stroke.  The patient is legally blind in both eyes.  Reports that he can see some light in the left eye.  But feels that it got slightly worse after the stroke.  Blood pressure today is in normal range.  He was discharged from the hospital on aspirin .  But due to CAD will not do any  further anticoagulation.  He was discharged on Crestor .  His wife thinks that he is still taking this medicine however she will verify his prescriptions at home.  He does not smoke cigarettes.  Continues on Keppra  500 mg twice a day.  No additional seizure like events.  He does not wish to discontinue Keppra .  He returns today for an evaluation.   HISTORY [name] is a 88 y.o. 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 Most recent surgery was 11/4 when scalp mass was excised, bone scrapings of skull were taken for biopsy and some sort of dermal matrix was placed. 11/5, patient noticed painful shaking of his right arm which was uncontrollable and lasted about 5 or 6 minutes. Afterwards, he was unable to move his right arm. He had 3 more of these episodes en route and in ED.  He states he has never had a serious head injury, has no family members with seizures and has never had meningitis or encephalitis, never had febrile seizures as a child and did not have any problems with his birth or development. No history of seizures.  MRI brain with and without contrast on  10/8 demonstrated no intracranial mass lesions to explain seizure.   Acute Ischemic Infarct:  mild embolic shower, etiology unclear, cryptogenic at time.   CT head: No acute intracranial abnormality. Generalized cerebral volume loss and moderate for age white matter disease. MRI: Numerous small acute infarcts in the left greater than right frontal and parietal lobes. Innumerable chronic microhemorrhages which may reflect cerebral amyloid angiopathy. Repeat CT Head 11/9: No acute intracranial hemorrhage or mass effect. Scattered small cerebral hemisphere infarcts remain largely occult by CT. CTA head and neck Moderate stenosis both  ICAs, right ACA, right PCA. Severe stenosis Left vertebral. 60-65% stenosis right ICA 2D Echo: EF 50-55%, Grade I diastolic dysfunction, Mild LVH Given CAA pattern on MRI, do not fee pt is candidate for anticoagulation. Would not recommend further cardioembolic work up at this time LDL 89 HgbA1c 5.3 UDS negative VTE prophylaxis - SCDs No antithrombotic prior to admission, now on ASA 81 alone given findings of CAA on MRI, continue on discharge.  Therapy recommendations:  CIR Disposition:  pending   Seizure-like activity Multiple episodes of right arm jerking, no other focal seizures symptoms, lasting 5 to 6 minutes each. Patient remembers all episodes, no post-ictal periods LTM EEG 11/6 1715 to 11/7 1208: This study is suggestive of mild diffuse encephalopathy. No seizures or epileptiform discharges were seen throughout the recording. Continue Keppra  500mg  BID   REVIEW OF SYSTEMS: Out of a complete 14 system review of symptoms, the patient complains only of the following symptoms, and all other reviewed systems are negative.  ALLERGIES: Allergies  Allergen Reactions   Adhesive [Tape] Rash   Fluorescein-Benoxinate Rash    FA dye (Fluress- eyes)     Valtrex [Valacyclovir] Anxiety    Nervousness Jittery    HOME MEDICATIONS: Outpatient Medications Prior to Visit  Medication Sig Dispense Refill   acetaminophen  (TYLENOL ) 325 MG tablet Take 1-2 tablets (325-650 mg total) by mouth every 4 (four) hours as needed for mild pain (pain score 1-3).     aspirin  EC 81 MG tablet Take 1 tablet (81 mg total) by mouth daily. Swallow whole. 30 tablet 0   ferrous sulfate  325 (65 FE) MG tablet Take 1 tablet (325 mg total) by mouth 2 (two) times daily with a meal. 30 tablet 3   folic acid  (FOLVITE ) 400 MCG tablet Take 400 mcg by mouth daily.     HYDROcodone -acetaminophen  (NORCO/VICODIN) 5-325 MG tablet Take 1 tablet by mouth every 4 (four) hours as needed. 10 tablet 0   levETIRAcetam  (KEPPRA ) 500 MG  tablet Take 1 tablet (500 mg total) by mouth 2 (two) times daily. 180 tablet 3   levothyroxine  (SYNTHROID ) 88 MCG tablet Take 88 mcg by mouth daily. (Patient taking differently: Take 75 mcg by mouth daily.)     melatonin 5 MG TABS Take 1 tablet (5 mg total) by mouth at bedtime as needed.     rosuvastatin  (CRESTOR ) 10 MG tablet Take 1 tablet (10 mg total) by mouth daily. 30 tablet 0   senna-docusate (SENOKOT-S) 8.6-50 MG tablet Take 1 tablet by mouth at bedtime as needed for mild constipation. 30 tablet 0   vitamin B-12 (CYANOCOBALAMIN ) 100 MCG tablet Take 100 mcg by mouth daily.     Vitamin D , Ergocalciferol , (DRISDOL ) 1.25 MG (50000 UNIT) CAPS capsule Take 50,000 Units by mouth every 7 (seven) days.     aspirin  EC 81 MG tablet Take 1 tablet by mouth daily. (Patient not taking: Reported on 02/08/2024)     cholecalciferol (VITAMIN D3)  25 MCG (1000 UNIT) tablet Take 1,000 Units by mouth.     glipiZIDE (GLUCOTROL XL) 2.5 MG 24 hr tablet Take 1 tablet by mouth daily. (Patient not taking: Reported on 02/08/2024)     metoprolol  tartrate (LOPRESSOR ) 25 MG tablet Take 0.5 tablets (12.5 mg total) by mouth 2 (two) times daily. (Patient not taking: Reported on 02/08/2024) 30 tablet 0   mupirocin ointment (BACTROBAN) 2 % Apply 1 Application topically daily. (Patient not taking: Reported on 02/08/2024)     No facility-administered medications prior to visit.    PAST MEDICAL HISTORY: Past Medical History:  Diagnosis Date   Chronic kidney disease    Diabetes mellitus without complication (HCC)    Glaucoma    Hyperlipidemia    Hypertension     PAST SURGICAL HISTORY: Past Surgical History:  Procedure Laterality Date   CHOLECYSTECTOMY     TOTAL HIP ARTHROPLASTY Left 02/15/2020   Procedure: TOTAL HIP ARTHROPLASTY  ANTERIOR APPROACH;  Surgeon: Fidel Rogue, MD;  Location: [facility] OR;  Service: Orthopedics;  Laterality: Left;    FAMILY HISTORY: Family History  Problem Relation Age of Onset   Heart  disease Father    Hypertension Father    Other Mother     SOCIAL HISTORY: Social History   Socioeconomic History   Marital status: Married    Spouse name: Not on file   Number of children: 3   Years of education: Not on file   Highest education level: Not on file  Occupational History   Occupation: retired  Tobacco Use   Smoking status: Former    Types: Cigarettes   Smokeless tobacco: Never   Tobacco comments:    smoked ~1ppd in his 20's. He is unsure how long he smoked.   Vaping Use   Vaping status: Never Used  Substance and Sexual Activity   Alcohol use: Never   Drug use: Never   Sexual activity: Not Currently  Other Topics Concern   Not on file  Social History Narrative   Patient lives in [place], [place] with his wife.    Social Drivers of Corporate investment banker Strain: Not on file  Food Insecurity: Low Risk  (01/23/2024)   Received from [facility]   Hunger Vital Sign    Within the past 12 months, you worried that your food would run out before you got money to buy more: Never true    Within the past 12 months, the food you bought just didn't last and you didn't have money to get more. : Never true  Transportation Needs: No Transportation Needs (01/23/2024)   Received from [facility]    In the past 12 months, has lack of reliable transportation kept you from medical appointments, meetings, work or from getting things needed for daily living? : No  Physical Activity: Not on file  Stress: Not on file  Social Connections: Not on file  Intimate Partner Violence: Not At Risk (03/03/2023)   Humiliation, Afraid, Rape, and Kick questionnaire    Fear of Current or Ex-Partner: No    Emotionally Abused: No    Physically Abused: No    Sexually Abused: No      PHYSICAL EXAM  Vitals:   02/08/24 1426  BP: 101/68  Pulse: (!) 107  SpO2: 98%   There is no height or weight on file to calculate BMI.  Generalized: Frail elderly Caucasian male  sitting in a wheelchair in no acute distress   Neurological examination  Mentation: Alert oriented to time, place, history taking. Follows all commands speech and language fluent Cranial nerve II-XII: Legally blind in both eyes.  Facial sensation and strength were normal. Uvula tongue midline. Head turning and shoulder shrug  were normal and symmetric. Motor: The motor testing reveals 4 over 5 strength of all 4 extremities. Good symmetric motor tone is noted throughout.  Sensory: Sensory testing is intact to soft touch on all 4 extremities. No evidence of extinction is noted.  Coordination: Unable to form finger-nose-finger due to visual deficit Gait and station: In a wheelchair today.   DIAGNOSTIC DATA (LABS, IMAGING, TESTING) - I reviewed patient records, labs, notes, testing and imaging myself where available.  Lab Results  Component Value Date   WBC 12.0 (H) 04/01/2023   HGB 9.8 (L) 04/01/2023   HCT 30.7 (L) 04/01/2023   MCV 98.7 04/01/2023   PLT 324 04/01/2023      Component Value Date/Time   NA 141 04/01/2023 0900   K 3.6 04/01/2023 0900   CL 108 04/01/2023 0900   CO2 23 04/01/2023 0900   GLUCOSE 102 (H) 04/01/2023 0900   BUN 18 04/01/2023 0900   CREATININE 1.48 (H) 04/01/2023 0900   CALCIUM  8.9 04/01/2023 0900   PROT 5.9 (L) 03/08/2023 0432   ALBUMIN  2.5 (L) 03/08/2023 0432   AST 25 03/08/2023 0432   ALT 13 03/08/2023 0432   ALKPHOS 65 03/08/2023 0432   BILITOT 0.9 03/08/2023 0432   GFRNONAA 46 (L) 04/01/2023 0900   GFRAA 57 (L) 11/03/2018 0234   Lab Results  Component Value Date   CHOL 138 03/05/2023   HDL 31 (L) 03/05/2023   LDLCALC 89 03/05/2023   TRIG 88 03/05/2023   CHOLHDL 4.5 03/05/2023   Lab Results  Component Value Date   HGBA1C 5.3 03/05/2023   Lab Results  Component Value Date   VITAMINB12 <50 (L) 11/02/2018   Lab Results  Component Value Date   TSH 3.483 03/01/2023      ASSESSMENT AND PLAN 88 y.o. year old male  has a past medical  history of Chronic kidney disease, Diabetes mellitus without complication (HCC), Glaucoma, Hyperlipidemia, and Hypertension. here with:  Acute ischemic infarct-mild embolic shower pattern likely cryptogenic etiology.  Abnormal MRI with innumerable microhemorrhages consistent with CAA Seizure-like event on Keppra  without any breakthrough events since November 2024  I had a long d/w patient and his wife about his recent strokes, legal blindness risk for recurrent stroke/TIAs, personally independently reviewed imaging studies and stroke evaluation results and answered questions.Continue aspirin  81 mg daily  for secondary stroke prevention and maintain strict control of hypertension with blood pressure goal below 130/90, diabetes with hemoglobin A1c goal below 6.5% and lipids with LDL cholesterol goal below 70 mg/dL. I also advised the patient to eat a healthy diet with plenty of whole grains, cereals, fruits and vegetables, exercise regularly and maintain ideal body weight .check screening carotid ultrasound study.  Continue Keppra  for seizure prophylaxis as patient seems quite reluctant to stop it followup in the future with me in only as needed and no scheduled appointment was made.     I personally spent a total of 35 minutes in the care of the patient today including getting/reviewing separately obtained history, performing a medically appropriate exam/evaluation, counseling and educating, placing orders, referring and communicating with other health care professionals, documenting clinical information in the EHR, independently interpreting results, and coordinating care.           Eather Popp, MD  02/08/2024, 2:43 PM [facility] [address], [address] [place], [place] [address] [phone]

## 2024-02-08 NOTE — Patient Instructions (Signed)
 I had a long d/w patient and his wife about his recent strokes, legal blindness risk for recurrent stroke/TIAs, personally independently reviewed imaging studies and stroke evaluation results and answered questions.Continue aspirin  81 mg daily  for secondary stroke prevention and maintain strict control of hypertension with blood pressure goal below 130/90, diabetes with hemoglobin A1c goal below 6.5% and lipids with LDL cholesterol goal below 70 mg/dL. I also advised the patient to eat a healthy diet with plenty of whole grains, cereals, fruits and vegetables, exercise regularly and maintain ideal body weight .check screening carotid ultrasound study.  Followup in the future with me in only as needed and no scheduled appointment was made.

## 2024-02-14 ENCOUNTER — Ambulatory Visit (HOSPITAL_COMMUNITY): Admission: RE | Admit: 2024-02-14 | Source: Ambulatory Visit

## 2024-02-20 ENCOUNTER — Ambulatory Visit (HOSPITAL_COMMUNITY): Attending: Neurology

## 2024-03-26 DEATH — deceased
